# Patient Record
Sex: Female | Born: 1990 | Hispanic: No | Marital: Single | Smoking: Former smoker
Health system: Southern US, Community
[De-identification: ages and names within clinical notes are randomized; demographics above are authoritative.]

## PROBLEM LIST (undated history)

## (undated) ENCOUNTER — Inpatient Hospital Stay (HOSPITAL_COMMUNITY): Payer: Self-pay

## (undated) ENCOUNTER — Inpatient Hospital Stay: Admission: RE | Payer: Managed Care, Other (non HMO) | Source: Home / Self Care

## (undated) DIAGNOSIS — G43909 Migraine, unspecified, not intractable, without status migrainosus: Secondary | ICD-10-CM

## (undated) DIAGNOSIS — D649 Anemia, unspecified: Secondary | ICD-10-CM

## (undated) DIAGNOSIS — Z9889 Other specified postprocedural states: Secondary | ICD-10-CM

## (undated) DIAGNOSIS — M797 Fibromyalgia: Secondary | ICD-10-CM

## (undated) DIAGNOSIS — N83209 Unspecified ovarian cyst, unspecified side: Secondary | ICD-10-CM

## (undated) DIAGNOSIS — F419 Anxiety disorder, unspecified: Secondary | ICD-10-CM

## (undated) DIAGNOSIS — M459 Ankylosing spondylitis of unspecified sites in spine: Secondary | ICD-10-CM

## (undated) DIAGNOSIS — J45909 Unspecified asthma, uncomplicated: Secondary | ICD-10-CM

## (undated) DIAGNOSIS — Z8759 Personal history of other complications of pregnancy, childbirth and the puerperium: Secondary | ICD-10-CM

## (undated) DIAGNOSIS — Z87442 Personal history of urinary calculi: Secondary | ICD-10-CM

## (undated) DIAGNOSIS — R112 Nausea with vomiting, unspecified: Secondary | ICD-10-CM

## (undated) DIAGNOSIS — N200 Calculus of kidney: Secondary | ICD-10-CM

## (undated) HISTORY — PX: OTHER SURGICAL HISTORY: SHX169

## (undated) HISTORY — DX: Ankylosing spondylitis of unspecified sites in spine: M45.9

## (undated) HISTORY — DX: Unspecified asthma, uncomplicated: J45.909

## (undated) HISTORY — PX: WISDOM TOOTH EXTRACTION: SHX21

## (undated) HISTORY — DX: Calculus of kidney: N20.0

## (undated) HISTORY — DX: Fibromyalgia: M79.7

## (undated) HISTORY — PX: COLONOSCOPY: SHX174

## (undated) HISTORY — DX: Personal history of other complications of pregnancy, childbirth and the puerperium: Z87.59

## (undated) HISTORY — DX: Migraine, unspecified, not intractable, without status migrainosus: G43.909

## (undated) HISTORY — PX: ESOPHAGOGASTRODUODENOSCOPY ENDOSCOPY: SHX5814

---
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
## 2010-01-29 DIAGNOSIS — M459 Ankylosing spondylitis of unspecified sites in spine: Secondary | ICD-10-CM

## 2010-01-29 HISTORY — DX: Ankylosing spondylitis of unspecified sites in spine: M45.9

## 2012-01-30 DIAGNOSIS — K5792 Diverticulitis of intestine, part unspecified, without perforation or abscess without bleeding: Secondary | ICD-10-CM

## 2012-01-30 HISTORY — DX: Diverticulitis of intestine, part unspecified, without perforation or abscess without bleeding: K57.92

## 2012-03-26 LAB — LAB REPORT - SCANNED
EGFR: 112.3
TSH: 2.55 (ref 0.41–5.90)

## 2012-05-21 LAB — LAB REPORT - SCANNED: EGFR: 96.3

## 2013-12-18 ENCOUNTER — Encounter: Payer: Self-pay | Admitting: Neurology

## 2013-12-21 ENCOUNTER — Ambulatory Visit (INDEPENDENT_AMBULATORY_CARE_PROVIDER_SITE_OTHER): Payer: BC Managed Care – PPO | Admitting: Neurology

## 2013-12-21 ENCOUNTER — Encounter: Payer: Self-pay | Admitting: Neurology

## 2013-12-21 VITALS — BP 115/70 | HR 94 | Ht 60.5 in | Wt 173.0 lb

## 2013-12-21 DIAGNOSIS — G43809 Other migraine, not intractable, without status migrainosus: Secondary | ICD-10-CM

## 2013-12-21 DIAGNOSIS — M5442 Lumbago with sciatica, left side: Secondary | ICD-10-CM

## 2013-12-21 DIAGNOSIS — G43909 Migraine, unspecified, not intractable, without status migrainosus: Secondary | ICD-10-CM | POA: Insufficient documentation

## 2013-12-21 DIAGNOSIS — R4189 Other symptoms and signs involving cognitive functions and awareness: Secondary | ICD-10-CM

## 2013-12-21 DIAGNOSIS — M545 Low back pain, unspecified: Secondary | ICD-10-CM | POA: Insufficient documentation

## 2013-12-21 DIAGNOSIS — M5441 Lumbago with sciatica, right side: Secondary | ICD-10-CM

## 2013-12-21 MED ORDER — TOPIRAMATE ER 100 MG PO CAP24: 100.0000 mg | ORAL_CAPSULE | Freq: Every day | ORAL | Status: DC

## 2013-12-21 NOTE — Progress Notes (Addendum)
[facility]    Provider:  Dr Lucia GaskinsAhern Referring Provider: Purcell Kim, Angelica Y, MD Primary Care Physician:  No primary care provider on file.  CC:  Migraines  HPI:  Angelica HiresMaria Kim is a 23 Kim.o. female here as a referral from Dr. Su Hiltoberts for Migraines. She just relocated to the area. Migraines started 3 years ago and is on imitrex for acute management. She has been seeing [place] Insurance account managereurologist [place]. Headaches feel like someone is beating her in the head, starts behind the right eye and pulsating and then spreads to the whole head. The imitrex helps but still has the headache afterwards, she has to lay down in the dark for 3 hours after taking the imitrex. +Phonophobia more than photophobia, no nausea. Has them 5-6 a month and they can last about 12 hours without Imitrex, Imitrex helps and they last 3 hours after taking that.  She had a CT scan in the past and found white matter changes but lumbar puncture with workup was normal. No known triggers for migraines but worse with sinus congestion. Takes ibuprofen for back pain prn. Did have low B12 and Vitamin D but was recently tested and is normal, takes daily supplementation. She has had multiple Cat Scans and possibly MRIs of the brain. Is a Engineer, siteschool teacher. She feels her memory is worsening. She has LBP and radicular symptoms into both legs, worse with temperature changes like in the heat or in the tub. Has had MRIs of low back. Worse with standing or from sitting too long. Sleeping varies, sometimes has insomnia. Is forgetting things easier than she used, is repeating herself more. Asks the same questions over. Is a new teacher, a little stressful. Is getting worse. Denies depression, sadness most days. Has a rheumatologist because tested positive for autoimmune disorder but they can't figure out which one. Right facial twitching 20 minutes before onset.   Review of Systems: Patient complains of symptoms per HPI as well as the following  symptoms: No CP, No SOB. Pertinent negatives per HPI. All others negative.   History   Social History  . Marital Status: Single    Spouse Name: N/A    Number of Children: 0  . Years of Education: Masters   Occup[place]  .  [employer]   Social History Main Topics  . Smoking status: Never Smoker   . Smokeless tobacco: Never Used  . Alcohol Use: 0.0 oz/week    0 Not specified per week     Comment: occ  . Drug Use: No  . Sexual Activity: Not on file   Other Topics Concern  . Not on file   Social History Narrative   Patient drinks caffeine moderate    Patient is single   Patient lives alone    Patient has no children    Patient has a Master's degree     Family History  Problem Relation Age of Onset  . Hypertension Maternal Grandmother   . Arthritis Mother   . Kidney Stones Maternal Grandfather   . Thyroid disease Maternal Grandmother     Past Medical History  Diagnosis Date  . Migraine   . Fibromyalgia   . Asthma   . Kidney stones   . Ankylosing spondylitis     Past Surgical History  Procedure Laterality Date  . Wisdom tooth extraction    . Spinal tap      Current Outpatient Prescriptions  Medication Sig Dispense Refill  . albuterol (PROVENTIL HFA;VENTOLIN  HFA) 108 (90 BASE) MCG/ACT inhaler Inhale 2 puffs into the lungs every 6 (six) hours as needed for wheezing or shortness of breath.    . Cyanocobalamin (VITAMIN B12 PO) Take by mouth.    . fluticasone (FLOVENT DISKUS) 50 MCG/BLIST diskus inhaler Inhale 1 puff into the lungs 2 (two) times daily.    Marland Kitchen ibuprofen (ADVIL,MOTRIN) 800 MG tablet Take 800 mg by mouth every 8 (eight) hours as needed.    . Mometasone Furoate (NASONEX NA) Place into the nose 2 (two) times daily.    . SUMAtriptan (IMITREX) 100 MG tablet Take 100 mg by mouth every 2 (two) hours as needed for migraine or headache. May repeat in 2 hours if headache persists or recurs.     No current facility-administered medications for  this visit.    Allergies as of 12/21/2013 - Review Complete 12/21/2013  Allergen Reaction Noted  . Amoxicillin  12/18/2013    Vitals: BP 115/70 mmHg  Pulse 94  Ht 5' 0.5" (1.537 m)  Wt 173 lb (78.472 kg)  BMI 33.22 kg/m2 Last Weight:  Wt Readings from Last 1 Encounters:  12/21/13 173 lb (78.472 kg)   Last Height:   Ht Readings from Last 1 Encounters:  12/21/13 5' 0.5" (1.537 m)   Physical exam: Exam: Gen: NAD, conversant, well nourised, obese, well groomed                     CV: RRR, no MRG. No Carotid Bruits. No peripheral edema, warm, nontender Eyes: Conjunctivae clear without exudates or hemorrhage  Neuro: Detailed Neurologic Exam  Speech:    Speech is normal; fluent and spontaneous with normal comprehension.  Cognition:    The patient is oriented to person, place, and time;     recent and remote memory intact;     language fluent;     normal attention, concentration,     fund of knowledge Cranial Nerves:    The pupils are equal, round, and reactive to light. The fundi are normal and spontaneous venous pulsations are present. Visual fields are full to finger confrontation. Extraocular movements are intact. Trigeminal sensation is intact and the muscles of mastication are normal. The face is symmetric. The palate elevates in the midline. Voice is normal. Shoulder shrug is normal. The tongue has normal motion without fasciculations.   Coordination:    Normal finger to nose and heel to shin. Normal rapid alternating movements.   Gait:    Heel-toe and tandem gait are normal.   Motor Observation:    No asymmetry, no atrophy, and no involuntary movements noted. Tone:    Normal muscle tone.    Posture:    Posture is normal. normal erect    Strength:    Strength is V/V in the upper and lower limbs.      Sensation: intact     Reflex Exam:  DTR's:    Deep tendon reflexes in the upper and lower extremities are normal bilaterally.   Toes:    The toes are  downgoing bilaterally.   Clonus:    Clonus is absent.       Assessment/Plan:  23 year old female here to establish care for migraine disorder. Has migraines 5-6x a month. Is on Imitrex with poor relief. Discussed daily vs acute management and side effects of multiple ppx medications. Patient would like to try Topiramate, discussed side effects and teratogenicity and the need for birth control and a backup method. Will start Trokendi. Noted  that patient has a history of kidney stones and discussed that this is a side effect of Trokendi, discussed risks at length and patient wanted to go forward with this medication despite the warning. Continue Imitrex. Sounds like she has had thorough workup from neurology and rheumatology and need to request all records. For her LBP and radicular symptoms suggested PT and will review her MRIs. For her memory complaints in the setting of stress, will review MRIs of the brain and lab reports from previous neurologist and rheumatologist(per patient B12 was abnornal at one point but is now fine). Discussed: fluid hydration, eat healthy meals and do not skip any meals. Try to eat protein with a every meal and eat a healthy snack such as fruit or nuts in between meals. Try to keep a regular sleep-wake schedule and try to exercise daily, particularly in the form of walking, 20-30 minutes a day, if you can. To prevent or relieve headaches, try the following: Cool Compress. Lie down and place a cool compress on your head.  Avoid headache triggers. If certain foods or odors seem to have triggered your migraines in the past, avoid them. A headache diary might help you identify triggers.  Include physical activity in your daily routine. Try a daily walk or other moderate aerobic exercise.  Manage stress. Find healthy ways to cope with the stressors, such as delegating tasks on your to-do list.  Practice relaxation techniques. Try deep breathing, yoga, massage and visualization.    Eat regularly. Eating regularly scheduled meals and maintaining a healthy diet might help prevent headaches. Also, drink plenty of fluids.  Follow a regular sleep schedule. Sleep deprivation might contribute to headaches Consider biofeedback. With this mind-body technique, you learn to control certain bodily functions - such as muscle tension, heart rate and blood pressure - to prevent headaches or reduce headache pain.    Proceed to emergency room if you experience new or worsening symptoms or symptoms do not resolve, if you have new neurologic symptoms or if headache is severe, or for any concerning symptom.    Angelica DeanAntonia Ahern, MD  [facility] [address] [place], [place] [id number]  Phone [phone] Fax [phone]

## 2013-12-21 NOTE — Patient Instructions (Signed)
Overall you are doing fairly well but I do want to suggest a few things today:   Remember to drink plenty of fluid, eat healthy meals and do not skip any meals. Try to eat protein with a every meal and eat a healthy snack such as fruit or nuts in between meals. Try to keep a regular sleep-wake schedule and try to exercise daily, particularly in the form of walking, 20-30 minutes a day, if you can.   As far as your medications are concerned, I would like to suggest; Trokendi as directed.   As far as diagnostic testing: Will review all previous records  I would like to see you back in 3 months, sooner if we need to. Please call us with any interim questions, concerns, problems, updates or refill requests.   Please also call us for any test results so we can go over those with you on the phone.  My clinical assistant and will answer any of your questions and relay your messages to me and also relay most of my messages to you.   Our phone number is [phone]. We also have an after hours call service for urgent matters and there is a physician on-call for urgent questions. For any emergencies you know to call 911 or go to the nearest emergency room

## 2013-12-29 ENCOUNTER — Telehealth: Payer: Self-pay | Admitting: *Deleted

## 2013-12-29 NOTE — Telephone Encounter (Signed)
Release fax to Dr. Carlynn SpryKivitz on 12/28/13 and on 12/29/13.

## 2013-12-30 ENCOUNTER — Emergency Department (HOSPITAL_COMMUNITY)
Admission: EM | Admit: 2013-12-30 | Discharge: 2013-12-30 | Disposition: A | Discharge status: Home / Self Care | Payer: BC Managed Care – PPO | Attending: Emergency Medicine | Admitting: Emergency Medicine

## 2013-12-30 ENCOUNTER — Telehealth: Payer: Self-pay | Admitting: Neurology

## 2013-12-30 ENCOUNTER — Encounter (HOSPITAL_COMMUNITY): Payer: Self-pay | Admitting: *Deleted

## 2013-12-30 DIAGNOSIS — I10 Essential (primary) hypertension: Secondary | ICD-10-CM | POA: Diagnosis present

## 2013-12-30 DIAGNOSIS — Z87442 Personal history of urinary calculi: Secondary | ICD-10-CM | POA: Diagnosis not present

## 2013-12-30 DIAGNOSIS — Z880 Allergy status to penicillin: Secondary | ICD-10-CM | POA: Diagnosis not present

## 2013-12-30 DIAGNOSIS — K8500 Idiopathic acute pancreatitis without necrosis or infection: Secondary | ICD-10-CM

## 2013-12-30 DIAGNOSIS — J45909 Unspecified asthma, uncomplicated: Secondary | ICD-10-CM | POA: Insufficient documentation

## 2013-12-30 DIAGNOSIS — Z7952 Long term (current) use of systemic steroids: Secondary | ICD-10-CM | POA: Insufficient documentation

## 2013-12-30 DIAGNOSIS — G43909 Migraine, unspecified, not intractable, without status migrainosus: Secondary | ICD-10-CM | POA: Insufficient documentation

## 2013-12-30 DIAGNOSIS — Z8739 Personal history of other diseases of the musculoskeletal system and connective tissue: Secondary | ICD-10-CM | POA: Diagnosis not present

## 2013-12-30 DIAGNOSIS — Z3202 Encounter for pregnancy test, result negative: Secondary | ICD-10-CM | POA: Diagnosis not present

## 2013-12-30 DIAGNOSIS — Z79899 Other long term (current) drug therapy: Secondary | ICD-10-CM | POA: Diagnosis not present

## 2013-12-30 LAB — CBC WITH DIFFERENTIAL/PLATELET
BASOS PCT: 0 % (ref 0–1)
Basophils Absolute: 0 10*3/uL (ref 0.0–0.1)
EOS ABS: 0.2 10*3/uL (ref 0.0–0.7)
Eosinophils Relative: 1 % (ref 0–5)
HCT: 40.2 % (ref 36.0–46.0)
Hemoglobin: 14 g/dL (ref 12.0–15.0)
Lymphocytes Relative: 29 % (ref 12–46)
Lymphs Abs: 3.3 10*3/uL (ref 0.7–4.0)
MCH: 29.9 pg (ref 26.0–34.0)
MCHC: 34.8 g/dL (ref 30.0–36.0)
MCV: 85.9 fL (ref 78.0–100.0)
MONO ABS: 1 10*3/uL (ref 0.1–1.0)
Monocytes Relative: 8 % (ref 3–12)
NEUTROS PCT: 62 % (ref 43–77)
Neutro Abs: 7 10*3/uL (ref 1.7–7.7)
Platelets: 271 10*3/uL (ref 150–400)
RBC: 4.68 MIL/uL (ref 3.87–5.11)
RDW: 12.5 % (ref 11.5–15.5)
WBC: 11.5 10*3/uL — ABNORMAL HIGH (ref 4.0–10.5)

## 2013-12-30 LAB — COMPREHENSIVE METABOLIC PANEL
ALBUMIN: 3.9 g/dL (ref 3.5–5.2)
ALT: 13 U/L (ref 0–35)
AST: 16 U/L (ref 0–37)
Alkaline Phosphatase: 118 U/L — ABNORMAL HIGH (ref 39–117)
Anion gap: 13 (ref 5–15)
BUN: 11 mg/dL (ref 6–23)
CALCIUM: 9.3 mg/dL (ref 8.4–10.5)
CO2: 24 mEq/L (ref 19–32)
Chloride: 99 mEq/L (ref 96–112)
Creatinine, Ser: 0.8 mg/dL (ref 0.50–1.10)
GFR calc Af Amer: >90 mL/min (ref >90)
GFR calc non Af Amer: >90 mL/min (ref >90)
Glucose, Bld: 97 mg/dL (ref 70–99)
POTASSIUM: 3.9 meq/L (ref 3.7–5.3)
Sodium: 136 mEq/L — ABNORMAL LOW (ref 137–147)
TOTAL PROTEIN: 7.3 g/dL (ref 6.0–8.3)
Total Bilirubin: 0.7 mg/dL (ref 0.3–1.2)

## 2013-12-30 LAB — URINALYSIS, ROUTINE W REFLEX MICROSCOPIC
Bilirubin Urine: NEGATIVE
Glucose, UA: NEGATIVE mg/dL
Hgb urine dipstick: NEGATIVE
Ketones, ur: NEGATIVE mg/dL
Leukocytes, UA: NEGATIVE
Nitrite: NEGATIVE
Protein, ur: NEGATIVE mg/dL
SPECIFIC GRAVITY, URINE: 1.025 (ref 1.005–1.030)
Urobilinogen, UA: 2 mg/dL — ABNORMAL HIGH (ref 0.0–1.0)
pH: 6 (ref 5.0–8.0)

## 2013-12-30 LAB — PREGNANCY, URINE: Preg Test, Ur: NEGATIVE

## 2013-12-30 LAB — LIPASE, BLOOD: LIPASE: 64 U/L — AB (ref 11–59)

## 2013-12-30 MED ORDER — OMEPRAZOLE 20 MG PO CPDR: 20.0000 mg | DELAYED_RELEASE_CAPSULE | Freq: Every day | ORAL | Status: DC

## 2013-12-30 MED ORDER — MORPHINE SULFATE 4 MG/ML IJ SOLN
4.0000 mg | Freq: Once | INTRAMUSCULAR | Status: AC
  Administered 2013-12-30: 4 mg via INTRAVENOUS
  Filled 2013-12-30: qty 1

## 2013-12-30 MED ORDER — LORAZEPAM 2 MG/ML IJ SOLN
1.0000 mg | Freq: Once | INTRAMUSCULAR | Status: AC
  Administered 2013-12-30: 1 mg via INTRAVENOUS
  Filled 2013-12-30: qty 1

## 2013-12-30 MED ORDER — GI COCKTAIL ~~LOC~~
30.0000 mL | Freq: Once | ORAL | Status: AC
  Administered 2013-12-30: 30 mL via ORAL
  Filled 2013-12-30: qty 30

## 2013-12-30 MED ORDER — SODIUM CHLORIDE 0.9 % IV BOLUS (SEPSIS)
1000.0000 mL | Freq: Once | INTRAVENOUS | Status: AC
  Administered 2013-12-30: 1000 mL via INTRAVENOUS

## 2013-12-30 MED ORDER — ONDANSETRON 4 MG PO TBDP: 4.0000 mg | ORAL_TABLET | Freq: Three times a day (TID) | ORAL | Status: DC | PRN

## 2013-12-30 MED ORDER — ONDANSETRON HCL 4 MG/2ML IJ SOLN
4.0000 mg | Freq: Once | INTRAMUSCULAR | Status: AC
  Administered 2013-12-30: 4 mg via INTRAVENOUS
  Filled 2013-12-30: qty 2

## 2013-12-30 NOTE — Telephone Encounter (Signed)
Pt is calling stating that the medication Topiramate ER (TROKENDI XR) 100 MG CP24 is causing her to have pelvic pain and pain up under her ribs that goes straight to her back.  Please call advise.

## 2013-12-30 NOTE — Telephone Encounter (Signed)
Dr Lucia GaskinsAhern, sorry this one is yours.  I'd for Megan by mistake.

## 2013-12-30 NOTE — Telephone Encounter (Signed)
I have not seen this patient. Please forward to the MD that has.

## 2013-12-30 NOTE — Discharge Instructions (Signed)
As discussed, with pancreatitis is important to maintain a clear liquid diet for the next 2 days, then, and only then, slowly began to eat normal foods.  Please stay well hydrated.  Return here for any concerning changes in your condition.

## 2013-12-30 NOTE — Telephone Encounter (Signed)
Advised patient to stop the Trokendi and she can proceed to the ED if pain is severe. Topiramate has a side effect of Kidney stones and we discussed this at length in the office given her PMHx. Discussed other options for headache ppx as well but patient wanted to try Trokendi despite the risks.  She should proceed to the ED if pain is severe. Would prefer her not to stop Topiramate abruptly but depends on the severity and quality of her pain. Left a message for her to call me back.

## 2013-12-30 NOTE — ED Notes (Signed)
Pt states she has pain under lt ribs, pain radiates across abdomen, tender to the touch.

## 2013-12-30 NOTE — ED Notes (Signed)
States her pain is starting to come back bad again.

## 2013-12-30 NOTE — ED Provider Notes (Signed)
CSN: [id number]     Arrival date & time 12/30/13  1639 History  This chart was scribed for Angelica Munch, MD by Gwenyth Ober, ED Scribe. This patient was seen in room [facility] and the patient's care was started at 4:56 PM.    Chief Complaint  Patient presents with  . Abdominal Pain   The history is provided by the patient. No language interpreter was used.    HPI Comments: Angelica Kim is a 23 y.o. female  with a history of fibromyalgia, asthma, arthritis and an unspecified autoimmune disease who presents to the Emergency Department complaining of constant abdominal pain that is inferior to her ribs and started 3 days ago. Pt states she felt a popping pain under right rib 3 days ago and that pain began to radiate to lower and left abdomen 2 days ago. She notes throbbing and burning pain after eating and increased abdominal pain with breathing as associated symptoms. Pt reports that she has tested positive for autoimmune disease, but has not received specific diagnosis. Pt also sees Dr. Lucia Gaskins for migraines. She denies history of abdominal surgeries, but notes that she has of kidney stones and an ovarian cyst. She notes that currently symptoms started out similar to prior pain, but have become different. Pt currently works as a Dispensing optician. She denies fever, difficulty breathing, weight loss/weight gain, rash, changes in voice, vomiting, diarrhea, loss of appetite and urinary changes as associated symptoms.   Past Medical History  Diagnosis Date  . Migraine   . Fibromyalgia   . Asthma   . Kidney stones   . Ankylosing spondylitis    Past Surgical History  Procedure Laterality Date  . Wisdom tooth extraction    . Spinal tap     Family History  Problem Relation Age of Onset  . Hypertension Maternal Grandmother   . Arthritis Mother   . Kidney Stones Maternal Grandfather   . Thyroid disease Maternal Grandmother    History  Substance Use Topics  . Smoking status: Never Smoker    . Smokeless tobacco: Never Used  . Alcohol Use: 0.0 oz/week    0 Not specified per week     Comment: occ   OB History    No data available     Review of Systems  Constitutional: Negative for fever, appetite change and unexpected weight change.       Per HPI, otherwise negative  HENT: Negative for voice change.        Per HPI, otherwise negative  Respiratory: Negative for shortness of breath.        Per HPI, otherwise negative  Cardiovascular:       Per HPI, otherwise negative  Gastrointestinal: Positive for abdominal pain. Negative for nausea, vomiting and diarrhea.  Endocrine:       Negative aside from HPI  Genitourinary: Negative for decreased urine volume and difficulty urinating.       Neg aside from HPI   Musculoskeletal:       Per HPI, otherwise negative  Skin: Negative.  Negative for rash.  Neurological: Negative for syncope.      Allergies  Amoxicillin  Home Medications   Prior to Admission medications   Medication Sig Start Date End Date Taking? Authorizing Provider  Cyanocobalamin (VITAMIN B12 PO) Take 1 tablet by mouth daily.    Yes Historical Provider, MD  fluticasone (FLOVENT DISKUS) 50 MCG/BLIST diskus inhaler Inhale 1 puff into the lungs 2 (two) times daily.   Yes Historical Provider,  MD  Mometasone Furoate (NASONEX NA) Place into the nose 2 (two) times daily.   Yes Historical Provider, MD  Topiramate ER (TROKENDI XR) 100 MG CP24 Take 100 mg by mouth daily. 12/21/13  Yes Anson FretAntonia B Ahern, MD  albuterol (PROVENTIL HFA;VENTOLIN HFA) 108 (90 BASE) MCG/ACT inhaler Inhale 2 puffs into the lungs every 6 (six) hours as needed for wheezing or shortness of breath.    Historical Provider, MD  omeprazole (PRILOSEC) 20 MG capsule Take 1 capsule (20 mg total) by mouth daily. 12/30/13   Angelica Munchobert Jordyn Doane, MD  ondansetron (ZOFRAN ODT) 4 MG disintegrating tablet Take 1 tablet (4 mg total) by mouth every 8 (eight) hours as needed for nausea or vomiting. 12/30/13   Angelica Munchobert  Reeves Musick, MD  SUMAtriptan (IMITREX) 100 MG tablet Take 100 mg by mouth every 2 (two) hours as needed for migraine or headache. May repeat in 2 hours if headache persists or recurs.    Historical Provider, MD   BP 93/57 mmHg  Pulse 78  Temp(Src) 98.7 F (37.1 C) (Oral)  Resp 20  Ht 5' (1.524 m)  Wt 168 lb (76.204 kg)  BMI 32.81 kg/m2  SpO2 97% Physical Exam  Constitutional: She is oriented to person, place, and time. She appears well-developed and well-nourished. No distress.  HENT:  Head: Normocephalic and atraumatic.  Eyes: Conjunctivae and EOM are normal.  Cardiovascular: Normal rate and regular rhythm.   Pulmonary/Chest: Effort normal and breath sounds normal. No stridor. No respiratory distress.  Abdominal: She exhibits no distension. There is tenderness. There is no rebound and no guarding.  Diffusely tender, but not peritoneal. No guarding or rebound. Inconsistent, general pain.  Musculoskeletal: She exhibits no edema.  Neurological: She is alert and oriented to person, place, and time. No cranial nerve deficit.  Skin: Skin is warm and dry.  Psychiatric: She has a normal mood and affect.  Nursing note and vitals reviewed.   ED Course  Procedures (including critical care time)  COORDINATION OF CARE: 5:03 PM Discussed treatment plan which includes lab work. Pt agreed to plan.  8:37 PM Pt notes continued nausea and dizziness. She does not want to be admitted to the hospital. Advised pt to follow clear liquid diet for next 24 hours and to return with worsening symptoms.   Labs Review Labs Reviewed  CBC WITH DIFFERENTIAL - Abnormal; Notable for the following:    WBC 11.5 (*)    All other components within normal limits  COMPREHENSIVE METABOLIC PANEL - Abnormal; Notable for the following:    Sodium 136 (*)    Alkaline Phosphatase 118 (*)    All other components within normal limits  LIPASE, BLOOD - Abnormal; Notable for the following:    Lipase 64 (*)    All other  components within normal limits  URINALYSIS, ROUTINE W REFLEX MICROSCOPIC - Abnormal; Notable for the following:    Urobilinogen, UA 2.0 (*)    All other components within normal limits  PREGNANCY, URINE    On several repeat evaluation the patient was awake and alert, hemodynamically stable.  She did continue to have mild nausea, though the pain improved. We had lengthy conversations about pancreatitis, the need to follow-up with gastroenterology. After the patient had persistent nausea, in spite of 2 L fluid hydration, I recommended inpatient admission for further evaluation, management.  The patient requested discharge with outpatient follow-up. Given the patient's hemodynamic status, youth, ability to follow-up, this was reasonable.   MDM   Final diagnoses:  Idiopathic  acute pancreatitis   I personally performed the services described in this documentation, which was scribed in my presence. The recorded information has been reviewed and is accurate.   Patient presents with new abdominal pain, nausea. Patient is in generally well, today's evaluation demonstrates pancreatitis is likely etiology. No evidence for peritonitis, nor hepatobiliary dysfunction. No evidence for sepsis, bacteremia. Patient did improve with fluid hydration, though she remained mildly nauseous. Patient requested discharge, for outpatient follow-up.     Angelica Munchobert Cahterine Heinzel, MD 12/30/13 2131

## 2013-12-30 NOTE — Telephone Encounter (Signed)
Megan, here's one. 

## 2013-12-31 ENCOUNTER — Encounter (INDEPENDENT_AMBULATORY_CARE_PROVIDER_SITE_OTHER): Payer: Self-pay | Admitting: *Deleted

## 2013-12-31 ENCOUNTER — Encounter (INDEPENDENT_AMBULATORY_CARE_PROVIDER_SITE_OTHER): Payer: Self-pay | Admitting: Internal Medicine

## 2013-12-31 ENCOUNTER — Ambulatory Visit (INDEPENDENT_AMBULATORY_CARE_PROVIDER_SITE_OTHER): Payer: BC Managed Care – PPO | Admitting: Internal Medicine

## 2013-12-31 VITALS — BP 112/60 | HR 80 | Temp 98.0°F | Ht 60.0 in | Wt 172.2 lb

## 2013-12-31 DIAGNOSIS — R1012 Left upper quadrant pain: Secondary | ICD-10-CM

## 2013-12-31 LAB — CBC WITH DIFFERENTIAL/PLATELET
BASOS ABS: 0 10*3/uL (ref 0.0–0.1)
BASOS PCT: 0 % (ref 0–1)
EOS PCT: 1 % (ref 0–5)
Eosinophils Absolute: 0.1 10*3/uL (ref 0.0–0.7)
HCT: 38.4 % (ref 36.0–46.0)
Hemoglobin: 13.1 g/dL (ref 12.0–15.0)
LYMPHS PCT: 31 % (ref 12–46)
Lymphs Abs: 3.5 10*3/uL (ref 0.7–4.0)
MCH: 29.4 pg (ref 26.0–34.0)
MCHC: 34.1 g/dL (ref 30.0–36.0)
MCV: 86.1 fL (ref 78.0–100.0)
MPV: 10.3 fL (ref 9.4–12.4)
Monocytes Absolute: 0.9 10*3/uL (ref 0.1–1.0)
Monocytes Relative: 8 % (ref 3–12)
Neutro Abs: 6.8 10*3/uL (ref 1.7–7.7)
Neutrophils Relative %: 60 % (ref 43–77)
Platelets: 264 10*3/uL (ref 150–400)
RBC: 4.46 MIL/uL (ref 3.87–5.11)
RDW: 12.7 % (ref 11.5–15.5)
WBC: 11.4 10*3/uL — AB (ref 4.0–10.5)

## 2013-12-31 NOTE — Progress Notes (Addendum)
Subjective:    Patient ID: Angelica Kim, female    DOB: 02-08-1990, 23 y.o.   MRN: [id number]  HPI Referred to our office from the ED at [facility] for pancreatitis. She had epigastric pain and left upper quadrant pain. The pain started Monday. She says she took a deep breath and felt something pop in her epigastric region. This is when the pain started.  She says the pain was in the left abdomen and radiated into her back. She was evaluated in the ED yesterday and given diagnosis of pancreatitis. She tells me she has a burning sensation in her epigastric region and just below her umbilicus.  There has been no fever. She says when she eats it hurts. Rates pain 10/10. She was told to drink gator aid and chicken broth. She is tolerating the liquid diet. She denies having these symptoms before. When she drinks she drinks maybe a glass of wine or one-two wine cooler.  Pain worse standing or lying down or quick, jerky movements.   Lipase     Component Value Date/Time   LIPASE 64* 12/30/2013 1715    CMP     Component Value Date/Time   NA 136* 12/30/2013 1715   K 3.9 12/30/2013 1715   CL 99 12/30/2013 1715   CO2 24 12/30/2013 1715   GLUCOSE 97 12/30/2013 1715   BUN 11 12/30/2013 1715   CREATININE 0.80 12/30/2013 1715   CALCIUM 9.3 12/30/2013 1715   PROT 7.3 12/30/2013 1715   ALBUMIN 3.9 12/30/2013 1715   AST 16 12/30/2013 1715   ALT 13 12/30/2013 1715   ALKPHOS 118* 12/30/2013 1715   BILITOT 0.7 12/30/2013 1715   GFRNONAA >90 12/30/2013 1715   GFRAA >90 12/30/2013 1715    CBC    Component Value Date/Time   WBC 11.5* 12/30/2013 1715   RBC 4.68 12/30/2013 1715   HGB 14.0 12/30/2013 1715   HCT 40.2 12/30/2013 1715   PLT 271 12/30/2013 1715   MCV 85.9 12/30/2013 1715   MCH 29.9 12/30/2013 1715   MCHC 34.8 12/30/2013 1715   RDW 12.5 12/30/2013 1715   LYMPHSABS 3.3 12/30/2013 1715   MONOABS 1.0 12/30/2013 1715   EOSABS 0.2 12/30/2013 1715   BASOSABS 0.0 12/30/2013 1715         Review of Systems Past Medical History  Diagnosis Date  . Migraine   . Fibromyalgia   . Asthma   . Kidney stones   . Ankylosing spondylitis     Past Surgical History  Procedure Laterality Date  . Wisdom tooth extraction    . Spinal tap      Allergies  Allergen Reactions  . Amoxicillin     Current Outpatient Prescriptions on File Prior to Visit  Medication Sig Dispense Refill  . albuterol (PROVENTIL HFA;VENTOLIN HFA) 108 (90 BASE) MCG/ACT inhaler Inhale 2 puffs into the lungs every 6 (six) hours as needed for wheezing or shortness of breath.    . Cyanocobalamin (VITAMIN B12 PO) Take 1 tablet by mouth daily.     . fluticasone (FLOVENT DISKUS) 50 MCG/BLIST diskus inhaler Inhale 1 puff into the lungs 2 (two) times daily.    . Mometasone Furoate (NASONEX NA) Place into the nose 2 (two) times daily.    . SUMAtriptan (IMITREX) 100 MG tablet Take 100 mg by mouth every 2 (two) hours as needed for migraine or headache. May repeat in 2 hours if headache persists or recurs.    . Topiramate ER (TROKENDI XR) 100 MG  CP24 Take 100 mg by mouth daily. 21 capsule 0  . omeprazole (PRILOSEC) 20 MG capsule Take 1 capsule (20 mg total) by mouth daily. (Patient not taking: Reported on 12/31/2013) 20 capsule 0  . ondansetron (ZOFRAN ODT) 4 MG disintegrating tablet Take 1 tablet (4 mg total) by mouth every 8 (eight) hours as needed for nausea or vomiting. (Patient not taking: Reported on 12/31/2013) 20 tablet 0   No current facility-administered medications on file prior to visit.        Objective:   Physical Exam Filed Vitals:   12/31/13 1447  Height: 5' (1.524 m)  Weight: 172 lb 3.2 oz (78.109 kg)    Alert and oriented. Skin warm and dry. Oral mucosa is moist.   . Sclera anicteric, conjunctivae is pink. Thyroid not enlarged. No cervical lymphadenopathy. Lungs clear. Heart regular rate and rhythm.  Abdomen is soft. Bowel sounds are positive. No hepatomegaly. No abdominal masses felt.  Tenderness epigastric region.   No edema to lower extremities.        Assessment & Plan:  Elevated lipase slight. CBC, lipase, cmet, US abdomen. Further recommendations to follow. Advised to go to ED if symptoms worsen.

## 2013-12-31 NOTE — Patient Instructions (Signed)
CBC, CMET, Lipase. US abdomen

## 2014-01-01 ENCOUNTER — Ambulatory Visit (HOSPITAL_COMMUNITY)
Admission: RE | Admit: 2014-01-01 | Discharge: 2014-01-01 | Disposition: A | Discharge status: Home / Self Care | Payer: BC Managed Care – PPO | Source: Ambulatory Visit | Attending: Internal Medicine | Admitting: Internal Medicine

## 2014-01-01 DIAGNOSIS — R1012 Left upper quadrant pain: Secondary | ICD-10-CM | POA: Insufficient documentation

## 2014-01-01 LAB — COMPREHENSIVE METABOLIC PANEL
ALBUMIN: 3.8 g/dL (ref 3.5–5.2)
ALK PHOS: 84 U/L (ref 39–117)
ALT: 11 U/L (ref 0–35)
AST: 15 U/L (ref 0–37)
BILIRUBIN TOTAL: 0.8 mg/dL (ref 0.2–1.2)
BUN: 9 mg/dL (ref 6–23)
CO2: 23 mEq/L (ref 19–32)
Calcium: 8.8 mg/dL (ref 8.4–10.5)
Chloride: 104 mEq/L (ref 96–112)
Creat: 0.75 mg/dL (ref 0.50–1.10)
Glucose, Bld: 81 mg/dL (ref 70–99)
POTASSIUM: 4 meq/L (ref 3.5–5.3)
Sodium: 137 mEq/L (ref 135–145)
Total Protein: 6.5 g/dL (ref 6.0–8.3)

## 2014-01-01 LAB — LIPASE: Lipase: 51 U/L (ref 0–75)

## 2014-01-03 ENCOUNTER — Emergency Department (HOSPITAL_COMMUNITY): Payer: BC Managed Care – PPO

## 2014-01-03 ENCOUNTER — Emergency Department (HOSPITAL_COMMUNITY)
Admission: EM | Admit: 2014-01-03 | Discharge: 2014-01-03 | Disposition: A | Discharge status: Home / Self Care | Payer: BC Managed Care – PPO | Attending: Emergency Medicine | Admitting: Emergency Medicine

## 2014-01-03 ENCOUNTER — Encounter (HOSPITAL_COMMUNITY): Payer: Self-pay | Admitting: *Deleted

## 2014-01-03 DIAGNOSIS — Z880 Allergy status to penicillin: Secondary | ICD-10-CM | POA: Diagnosis not present

## 2014-01-03 DIAGNOSIS — Z3202 Encounter for pregnancy test, result negative: Secondary | ICD-10-CM | POA: Diagnosis not present

## 2014-01-03 DIAGNOSIS — Z87442 Personal history of urinary calculi: Secondary | ICD-10-CM | POA: Diagnosis not present

## 2014-01-03 DIAGNOSIS — J45909 Unspecified asthma, uncomplicated: Secondary | ICD-10-CM | POA: Insufficient documentation

## 2014-01-03 DIAGNOSIS — R1033 Periumbilical pain: Secondary | ICD-10-CM | POA: Diagnosis present

## 2014-01-03 DIAGNOSIS — R1110 Vomiting, unspecified: Secondary | ICD-10-CM | POA: Insufficient documentation

## 2014-01-03 DIAGNOSIS — Z79899 Other long term (current) drug therapy: Secondary | ICD-10-CM | POA: Diagnosis not present

## 2014-01-03 DIAGNOSIS — Z8739 Personal history of other diseases of the musculoskeletal system and connective tissue: Secondary | ICD-10-CM | POA: Insufficient documentation

## 2014-01-03 DIAGNOSIS — Z7951 Long term (current) use of inhaled steroids: Secondary | ICD-10-CM | POA: Diagnosis not present

## 2014-01-03 DIAGNOSIS — R1084 Generalized abdominal pain: Secondary | ICD-10-CM | POA: Insufficient documentation

## 2014-01-03 DIAGNOSIS — N939 Abnormal uterine and vaginal bleeding, unspecified: Secondary | ICD-10-CM | POA: Insufficient documentation

## 2014-01-03 DIAGNOSIS — N760 Acute vaginitis: Secondary | ICD-10-CM | POA: Diagnosis not present

## 2014-01-03 DIAGNOSIS — G43909 Migraine, unspecified, not intractable, without status migrainosus: Secondary | ICD-10-CM | POA: Insufficient documentation

## 2014-01-03 DIAGNOSIS — R109 Unspecified abdominal pain: Secondary | ICD-10-CM

## 2014-01-03 DIAGNOSIS — B9689 Other specified bacterial agents as the cause of diseases classified elsewhere: Secondary | ICD-10-CM

## 2014-01-03 LAB — POC URINE PREG, ED: Preg Test, Ur: NEGATIVE

## 2014-01-03 LAB — URINALYSIS, ROUTINE W REFLEX MICROSCOPIC
Bilirubin Urine: NEGATIVE
Glucose, UA: NEGATIVE mg/dL
Ketones, ur: NEGATIVE mg/dL
LEUKOCYTES UA: NEGATIVE
Nitrite: NEGATIVE
PROTEIN: NEGATIVE mg/dL
Specific Gravity, Urine: 1.024 (ref 1.005–1.030)
Urobilinogen, UA: 1 mg/dL (ref 0.0–1.0)
pH: 6.5 (ref 5.0–8.0)

## 2014-01-03 LAB — CBC WITH DIFFERENTIAL/PLATELET
BASOS PCT: 0 % (ref 0–1)
Basophils Absolute: 0 10*3/uL (ref 0.0–0.1)
EOS ABS: 0.1 10*3/uL (ref 0.0–0.7)
EOS PCT: 1 % (ref 0–5)
HEMATOCRIT: 42.9 % (ref 36.0–46.0)
HEMOGLOBIN: 14.7 g/dL (ref 12.0–15.0)
LYMPHS ABS: 3 10*3/uL (ref 0.7–4.0)
Lymphocytes Relative: 28 % (ref 12–46)
MCH: 29.5 pg (ref 26.0–34.0)
MCHC: 34.3 g/dL (ref 30.0–36.0)
MCV: 86.1 fL (ref 78.0–100.0)
MONO ABS: 0.8 10*3/uL (ref 0.1–1.0)
Monocytes Relative: 8 % (ref 3–12)
NEUTROS PCT: 63 % (ref 43–77)
Neutro Abs: 6.6 10*3/uL (ref 1.7–7.7)
Platelets: 306 10*3/uL (ref 150–400)
RBC: 4.98 MIL/uL (ref 3.87–5.11)
RDW: 12.4 % (ref 11.5–15.5)
WBC: 10.5 10*3/uL (ref 4.0–10.5)

## 2014-01-03 LAB — COMPREHENSIVE METABOLIC PANEL
ALBUMIN: 3.8 g/dL (ref 3.5–5.2)
ALT: 11 U/L (ref 0–35)
ANION GAP: 12 (ref 5–15)
AST: 15 U/L (ref 0–37)
Alkaline Phosphatase: 106 U/L (ref 39–117)
BUN: 10 mg/dL (ref 6–23)
CALCIUM: 9.4 mg/dL (ref 8.4–10.5)
CO2: 25 mEq/L (ref 19–32)
CREATININE: 0.8 mg/dL (ref 0.50–1.10)
Chloride: 101 mEq/L (ref 96–112)
GFR calc Af Amer: >90 mL/min (ref >90)
GFR calc non Af Amer: >90 mL/min (ref >90)
GLUCOSE: 81 mg/dL (ref 70–99)
Potassium: 4 mEq/L (ref 3.7–5.3)
Sodium: 138 mEq/L (ref 137–147)
TOTAL PROTEIN: 7.3 g/dL (ref 6.0–8.3)
Total Bilirubin: 0.6 mg/dL (ref 0.3–1.2)

## 2014-01-03 LAB — URINE MICROSCOPIC-ADD ON

## 2014-01-03 LAB — WET PREP, GENITAL
Trich, Wet Prep: NONE SEEN
Yeast Wet Prep HPF POC: NONE SEEN

## 2014-01-03 LAB — LIPASE, BLOOD: LIPASE: 54 U/L (ref 11–59)

## 2014-01-03 MED ORDER — SODIUM CHLORIDE 0.9 % IV BOLUS (SEPSIS)
1000.0000 mL | Freq: Once | INTRAVENOUS | Status: AC
  Administered 2014-01-03: 1000 mL via INTRAVENOUS

## 2014-01-03 MED ORDER — METRONIDAZOLE 500 MG PO TABS
500.0000 mg | ORAL_TABLET | Freq: Once | ORAL | Status: AC
  Administered 2014-01-03: 500 mg via ORAL
  Filled 2014-01-03: qty 1

## 2014-01-03 MED ORDER — ONDANSETRON HCL 4 MG/2ML IJ SOLN
4.0000 mg | Freq: Once | INTRAMUSCULAR | Status: DC
  Filled 2014-01-03: qty 2

## 2014-01-03 MED ORDER — MORPHINE SULFATE 4 MG/ML IJ SOLN
4.0000 mg | Freq: Once | INTRAMUSCULAR | Status: AC
  Administered 2014-01-03: 4 mg via INTRAVENOUS
  Filled 2014-01-03: qty 1

## 2014-01-03 MED ORDER — ONDANSETRON HCL 4 MG/2ML IJ SOLN
4.0000 mg | Freq: Once | INTRAMUSCULAR | Status: AC
  Administered 2014-01-03: 4 mg via INTRAVENOUS
  Filled 2014-01-03: qty 2

## 2014-01-03 MED ORDER — IOHEXOL 300 MG/ML  SOLN
100.0000 mL | Freq: Once | INTRAMUSCULAR | Status: AC | PRN
  Administered 2014-01-03: 100 mL via INTRAVENOUS

## 2014-01-03 MED ORDER — METRONIDAZOLE 500 MG PO TABS
500.0000 mg | ORAL_TABLET | Freq: Two times a day (BID) | ORAL | Status: DC
Start: 2014-01-03 — End: 2014-02-22

## 2014-01-03 MED ORDER — ONDANSETRON HCL 4 MG PO TABS: 4.0000 mg | ORAL_TABLET | Freq: Four times a day (QID) | ORAL | Status: DC

## 2014-01-03 MED ORDER — HYDROCODONE-ACETAMINOPHEN 5-325 MG PO TABS: 1.0000 | ORAL_TABLET | ORAL | Status: DC | PRN

## 2014-01-03 NOTE — ED Notes (Signed)
Pt to be written pain and nausea medications; awaiting PA at this time. Tiffany aware

## 2014-01-03 NOTE — ED Provider Notes (Signed)
CSN: [id number]     Arrival date & time 01/03/14  1426 History   First MD Initiated Contact with Patient 01/03/14 1555     Chief Complaint  Patient presents with  . Abdominal Pain  . Emesis     (Consider location/radiation/quality/duration/timing/severity/associated sxs/prior Treatment) HPI   Patient to the ED from [place] in [place] for further evaluation of her abdominal pain. She was told she has pancreatitis earlier this week at [facility] and had an outpatient abdominal US to which  she has not yet gotten the results of. They put her on a liquid diet. She did not get pain medications. She reports continuing to have pain and has been throwing up. The pain is periumbilical and radiates towards the epigastric, LUQ and RUQ. She is having break through vaginal bleeding and has not had a period in the past two years.She denies having weakness or fevers.  She denies dysuria.  Past Medical History  Diagnosis Date  . Migraine   . Fibromyalgia   . Asthma   . Kidney stones   . Ankylosing spondylitis    Past Surgical History  Procedure Laterality Date  . Wisdom tooth extraction    . Spinal tap     Family History  Problem Relation Age of Onset  . Hypertension Maternal Grandmother   . Arthritis Mother   . Kidney Stones Maternal Grandfather   . Thyroid disease Maternal Grandmother    History  Substance Use Topics  . Smoking status: Never Smoker   . Smokeless tobacco: Never Used  . Alcohol Use: 0.0 oz/week    0 Not specified per week     Comment: occ   OB History    No data available     Review of Systems  10 Systems reviewed and are negative for acute change except as noted in the HPI.   Allergies  Amoxicillin  Home Medications   Prior to Admission medications   Medication Sig Start Date End Date Taking? Authorizing Provider  albuterol (PROVENTIL HFA;VENTOLIN HFA) 108 (90 BASE) MCG/ACT inhaler Inhale 2 puffs into the lungs every 6 (six) hours as needed for wheezing or  shortness of breath.   Yes Historical Provider, MD  Cyanocobalamin (VITAMIN B12 PO) Take 1 tablet by mouth daily.    Yes Historical Provider, MD  etonogestrel (NEXPLANON) 68 MG IMPL implant 1 each by Subdermal route once.   Yes Historical Provider, MD  fluticasone (FLOVENT DISKUS) 50 MCG/BLIST diskus inhaler Inhale 1 puff into the lungs 2 (two) times daily.   Yes Historical Provider, MD  Mometasone Furoate (NASONEX NA) Place into the nose 2 (two) times daily.   Yes Historical Provider, MD  omeprazole (PRILOSEC) 20 MG capsule Take 1 capsule (20 mg total) by mouth daily. 12/30/13  Yes Gerhard Munchobert Lockwood, MD  SUMAtriptan (IMITREX) 100 MG tablet Take 100 mg by mouth every 2 (two) hours as needed for migraine or headache. May repeat in 2 hours if headache persists or recurs.   Yes Historical Provider, MD  metroNIDAZOLE (FLAGYL) 500 MG tablet Take 1 tablet (500 mg total) by mouth 2 (two) times daily. 01/03/14   Gitel Beste Irine SealG Esra Frankowski, PA-C  ondansetron (ZOFRAN ODT) 4 MG disintegrating tablet Take 1 tablet (4 mg total) by mouth every 8 (eight) hours as needed for nausea or vomiting. Patient not taking: Reported on 12/31/2013 12/30/13   Gerhard Munchobert Lockwood, MD  Topiramate ER (TROKENDI XR) 100 MG CP24 Take 100 mg by mouth daily. 12/21/13   Anson FretAntonia B Ahern, MD  BP 90/59 mmHg  Pulse 70  Temp(Src) 98.4 F (36.9 C) (Oral)  Resp 18  SpO2 97%  LMP 01/02/2014 Physical Exam  Constitutional: She appears well-developed and well-nourished. No distress.  HENT:  Head: Normocephalic and atraumatic.  Eyes: Pupils are equal, round, and reactive to light.  Neck: Normal range of motion. Neck supple.  Cardiovascular: Normal rate and regular rhythm.   Pulmonary/Chest: Effort normal.  Abdominal: Soft. Bowel sounds are normal. She exhibits no distension. There is tenderness (diffuse, worse periumbilical. ). There is no rigidity, no rebound, no guarding and no CVA tenderness.  Genitourinary: Uterus is tender. Uterus is not deviated and  not enlarged. Cervix exhibits no motion tenderness, no discharge and no friability. Right adnexum displays no mass and no tenderness. Left adnexum displays no mass and no tenderness. There is bleeding in the vagina. No vaginal discharge found.  Neurological: She is alert.  Skin: Skin is warm and dry.  Nursing note and vitals reviewed.   ED Course  Procedures (including critical care time) Labs Review Labs Reviewed  WET PREP, GENITAL - Abnormal; Notable for the following:    Clue Cells Wet Prep HPF POC FEW (*)    WBC, Wet Prep HPF POC FEW (*)    All other components within normal limits  URINALYSIS, ROUTINE W REFLEX MICROSCOPIC - Abnormal; Notable for the following:    Color, Urine AMBER (*)    APPearance CLOUDY (*)    Hgb urine dipstick LARGE (*)    All other components within normal limits  URINE MICROSCOPIC-ADD ON - Abnormal; Notable for the following:    Squamous Epithelial / LPF FEW (*)    Bacteria, UA FEW (*)    All other components within normal limits  GC/CHLAMYDIA PROBE AMP  CBC WITH DIFFERENTIAL  COMPREHENSIVE METABOLIC PANEL  LIPASE, BLOOD  POC URINE PREG, ED    Imaging Review Ct Abdomen Pelvis W Contrast  01/03/2014   CLINICAL DATA:  Patient with upper abdominal pain. Reports recent diagnosis of pancreatitis.  EXAM: CT ABDOMEN AND PELVIS WITH CONTRAST  TECHNIQUE: Multidetector CT imaging of the abdomen and pelvis was performed using the standard protocol following bolus administration of intravenous contrast.  CONTRAST:  100mL OMNIPAQUE IOHEXOL 300 MG/ML  SOLN  COMPARISON:  Ultrasound 01/01/2014  FINDINGS: Visualization of the lower thorax demonstrates dependent atelectasis. No consolidative or nodular pulmonary opacities. The heart is normal in size.  The liver is normal in size and contour without focal hepatic lesion identified. Gallbladder is unremarkable.  The spleen, pancreas and bilateral adrenal glands are unremarkable. Kidneys enhance symmetrically with contrast.  There is a 3 mm nonobstructing stone within the inferior pole of the left kidney. No hydronephrosis. Urinary bladder is unremarkable.  Normal caliber abdominal aorta. No retroperitoneal lymphadenopathy. Uterus is unremarkable. The ovaries are unremarkable.  No abnormal bowel wall thickening or evidence for bowel obstruction. Multiple sub cm right lower quadrant mesenteric lymph nodes. Normal appendix. No small bowel wall thickening or evidence for small bowel obstruction. No free fluid or free intraperitoneal air.  No aggressive or acute appearing osseous lesions.  IMPRESSION: Normal CT appearance of the pancreas.  Nonobstructing 3 mm stone inferior pole left kidney.  Normal appendix.   Electronically Signed   By: Annia Beltrew  Davis M.D.   On: 01/03/2014 20:22     EKG Interpretation None      MDM   Final diagnoses:  Abdominal pain  BV (bacterial vaginosis)    Patient had a thorough work-up in the ED,  she is having break through vaginal bleeding, and had uterine tenderness that shot pain up periumbilically. She has been nauseous. Her wet prep showed a few clue cells. Otherwise her blood work was not significant for any etiology of her pain. I will treat her with metronidazole and wait for GC cultures to return as this may be the etiology of her pain.  Patient was able to control her pain in the ED and requested foot as she is hungry. She tolerated this. Will give abx, pain and nausea medicine for home. She is referred to GI if this abx and pain medication does not work.  23 y.o.Angelica Kim's evaluation in the Emergency Department is complete. It has been determined that no acute conditions requiring further emergency intervention are present at this time. The patient/guardian have been advised of the diagnosis and plan. We have discussed signs and symptoms that warrant return to the ED, such as changes or worsening in symptoms.  Vital signs are stable at discharge. Filed Vitals:   01/03/14 2040  BP:    Pulse:   Temp: 98.4 F (36.9 C)  Resp:     Patient/guardian has voiced understanding and agreed to follow-up with the PCP or specialist.      Dorthula Matas, PA-C 01/03/14 2045  Toy Cookey, MD 01/04/14 1301

## 2014-01-03 NOTE — ED Notes (Signed)
Pt given crackers to eat

## 2014-01-03 NOTE — ED Notes (Signed)
Pt given ice chips per [place] PA

## 2014-01-03 NOTE — Discharge Instructions (Signed)
Bacterial Vaginosis °Bacterial vaginosis is a vaginal infection that occurs when the normal balance of bacteria in the vagina is disrupted. It results from an overgrowth of certain bacteria. This is the most common vaginal infection in women of childbearing age. Treatment is important to prevent complications, especially in pregnant women, as it can cause a premature delivery. °CAUSES  °Bacterial vaginosis is caused by an increase in harmful bacteria that are normally present in smaller amounts in the vagina. Several different kinds of bacteria can cause bacterial vaginosis. However, the reason that the condition develops is not fully understood. °RISK FACTORS °Certain activities or behaviors can put you at an increased risk of developing bacterial vaginosis, including: °· Having a new sex partner or multiple sex partners. °· Douching. °· Using an intrauterine device (IUD) for contraception. °Women do not get bacterial vaginosis from toilet seats, bedding, swimming pools, or contact with objects around them. °SIGNS AND SYMPTOMS  °Some women with bacterial vaginosis have no signs or symptoms. Common symptoms include: °· Grey vaginal discharge. °· A fishlike odor with discharge, especially after sexual intercourse. °· Itching or burning of the vagina and vulva. °· Burning or pain with urination. °DIAGNOSIS  °Your health care provider will take a medical history and examine the vagina for signs of bacterial vaginosis. A sample of vaginal fluid may be taken. Your health care provider will look at this sample under a microscope to check for bacteria and abnormal cells. A vaginal pH test may also be done.  °TREATMENT  °Bacterial vaginosis may be treated with antibiotic medicines. These may be given in the form of a pill or a vaginal cream. A second round of antibiotics may be prescribed if the condition comes back after treatment.  °HOME CARE INSTRUCTIONS  °· Only take over-the-counter or prescription medicines as  directed by your health care provider. °· If antibiotic medicine was prescribed, take it as directed. Make sure you finish it even if you start to feel better. °· Do not have sex until treatment is completed. °· Tell all sexual partners that you have a vaginal infection. They should see their health care provider and be treated if they have problems, such as a mild rash or itching. °· Practice safe sex by using condoms and only having one sex partner. °SEEK MEDICAL CARE IF:  °· Your symptoms are not improving after 3 days of treatment. °· You have increased discharge or pain. °· You have a fever. °MAKE SURE YOU:  °· Understand these instructions. °· Will watch your condition. °· Will get help right away if you are not doing well or get worse. °FOR MORE INFORMATION  °Centers for Disease Control and Prevention, Division of STD Prevention: www.cdc.gov/std °American Sexual Health Association (ASHA): www.ashastd.org  °Document Released: 01/15/2005 Document Revised: 11/05/2012 Document Reviewed: 08/27/2012 °ExitCare® Patient Information ©2015 [employer]. This information is not intended to replace advice given to you by your health care provider. Make sure you discuss any questions you have with your health care provider. ° ° °Abdominal Pain, Women °Abdominal (stomach, pelvic, or belly) pain can be caused by many things. It is important to tell your doctor: °· The location of the pain. °· Does it come and go or is it present all the time? °· Are there things that start the pain (eating certain foods, exercise)? °· Are there other symptoms associated with the pain (fever, nausea, vomiting, diarrhea)? °All of this is helpful to know when trying to find the cause of the pain. °CAUSES  °·   Stomach: virus or bacteria infection, or ulcer. °· Intestine: appendicitis (inflamed appendix), regional ileitis (Crohn's disease), ulcerative colitis (inflamed colon), irritable bowel syndrome, diverticulitis (inflamed diverticulum of  the colon), or cancer of the stomach or intestine. °· Gallbladder disease or stones in the gallbladder. °· Kidney disease, kidney stones, or infection. °· Pancreas infection or cancer. °· Fibromyalgia (pain disorder). °· Diseases of the female organs: °· Uterus: fibroid (non-cancerous) tumors or infection. °· Fallopian tubes: infection or tubal pregnancy. °· Ovary: cysts or tumors. °· Pelvic adhesions (scar tissue). °· Endometriosis (uterus lining tissue growing in the pelvis and on the pelvic organs). °· Pelvic congestion syndrome (female organs filling up with blood just before the menstrual period). °· Pain with the menstrual period. °· Pain with ovulation (producing an egg). °· Pain with an IUD (intrauterine device, birth control) in the uterus. °· Cancer of the female organs. °· Functional pain (pain not caused by a disease, may improve without treatment). °· Psychological pain. °· Depression. °DIAGNOSIS  °Your doctor will decide the seriousness of your pain by doing an examination. °· Blood tests. °· X-rays. °· Ultrasound. °· CT scan (computed tomography, special type of X-ray). °· MRI (magnetic resonance imaging). °· Cultures, for infection. °· Barium enema (dye inserted in the large intestine, to better view it with X-rays). °· Colonoscopy (looking in intestine with a lighted tube). °· Laparoscopy (minor surgery, looking in abdomen with a lighted tube). °· Major abdominal exploratory surgery (looking in abdomen with a large incision). °TREATMENT  °The treatment will depend on the cause of the pain.  °· Many cases can be observed and treated at home. °· Over-the-counter medicines recommended by your caregiver. °· Prescription medicine. °· Antibiotics, for infection. °· Birth control pills, for painful periods or for ovulation pain. °· Hormone treatment, for endometriosis. °· Nerve blocking injections. °· Physical therapy. °· Antidepressants. °· Counseling with a psychologist or psychiatrist. °· Minor or major  surgery. °HOME CARE INSTRUCTIONS  °· Do not take laxatives, unless directed by your caregiver. °· Take over-the-counter pain medicine only if ordered by your caregiver. Do not take aspirin because it can cause an upset stomach or bleeding. °· Try a clear liquid diet (broth or water) as ordered by your caregiver. Slowly move to a bland diet, as tolerated, if the pain is related to the stomach or intestine. °· Have a thermometer and take your temperature several times a day, and record it. °· Bed rest and sleep, if it helps the pain. °· Avoid sexual intercourse, if it causes pain. °· Avoid stressful situations. °· Keep your follow-up appointments and tests, as your caregiver orders. °· If the pain does not go away with medicine or surgery, you may try: °· Acupuncture. °· Relaxation exercises (yoga, meditation). °· Group therapy. °· Counseling. °SEEK MEDICAL CARE IF:  °· You notice certain foods cause stomach pain. °· Your home care treatment is not helping your pain. °· You need stronger pain medicine. °· You want your IUD removed. °· You feel faint or lightheaded. °· You develop nausea and vomiting. °· You develop a rash. °· You are having side effects or an allergy to your medicine. °SEEK IMMEDIATE MEDICAL CARE IF:  °· Your pain does not go away or gets worse. °· You have a fever. °· Your pain is felt only in portions of the abdomen. The right side could possibly be appendicitis. The left lower portion of the abdomen could be colitis or diverticulitis. °· You are passing blood in your stools (bright   red or black tarry stools, with or without vomiting). °· You have blood in your urine. °· You develop chills, with or without a fever. °· You pass out. °MAKE SURE YOU:  °· Understand these instructions. °· Will watch your condition. °· Will get help right away if you are not doing well or get worse. °Document Released: 11/12/2006 Document Revised: 06/01/2013 Document Reviewed: 12/02/2008 °ExitCare® Patient Information  ©2015 [employer]. This information is not intended to replace advice given to you by your health care provider. Make sure you discuss any questions you have with your health care provider. ° °

## 2014-01-03 NOTE — ED Notes (Signed)
Pt c/o upper abdominal pain x 4days ago. Was seen at [facility] and diagnosed with pancreatitis. Pt states she hasn't been able to eat anything over the weekend. Pt was been drinking Gatorade and keeping it down. Pt was given PO Zofran prescription in which "doesn't help." Pt also c/o bilateral flank pain; has known kidney stones.

## 2014-01-03 NOTE — ED Notes (Signed)
CT notified of pt finishing contrast. 

## 2014-01-03 NOTE — ED Notes (Signed)
Pt reports being seen at [facility] earlier this week due to upper abd pain, was diagnosed with pancreatitis and told to drink fluids and then start bland diet this weekend. Reports still having pain and unable to tolerate broth and toast, having n/v.

## 2014-01-03 NOTE — ED Notes (Signed)
Pt reports the contrast "burns on the this side (pt pointing to LUQ)."

## 2014-01-04 LAB — GC/CHLAMYDIA PROBE AMP
CT PROBE, AMP APTIMA: NEGATIVE
GC PROBE AMP APTIMA: NEGATIVE

## 2014-01-11 ENCOUNTER — Encounter (INDEPENDENT_AMBULATORY_CARE_PROVIDER_SITE_OTHER): Payer: Self-pay | Admitting: *Deleted

## 2014-02-22 ENCOUNTER — Encounter (INDEPENDENT_AMBULATORY_CARE_PROVIDER_SITE_OTHER): Payer: Self-pay | Admitting: Internal Medicine

## 2014-02-22 ENCOUNTER — Ambulatory Visit (INDEPENDENT_AMBULATORY_CARE_PROVIDER_SITE_OTHER): Payer: BC Managed Care – PPO | Admitting: Internal Medicine

## 2014-02-22 VITALS — BP 108/52 | HR 64 | Temp 97.4°F | Ht 60.0 in | Wt 172.7 lb

## 2014-02-22 DIAGNOSIS — K219 Gastro-esophageal reflux disease without esophagitis: Secondary | ICD-10-CM

## 2014-02-22 MED ORDER — OMEPRAZOLE 20 MG PO TBEC: 20.0000 mg | DELAYED_RELEASE_TABLET | ORAL | Status: DC

## 2014-02-22 NOTE — Patient Instructions (Signed)
Omeprazole 20mg daily.  °

## 2014-02-22 NOTE — Progress Notes (Signed)
Subjective:    Patient ID: Angelica Kim, female    DOB: November 01, 1990, 24 y.o.   MRN: [id number]  HPI Here today for f/u.  She tells me she is doing good. Occasionally has epigastric burning after eating. She feels good. Appetite is good. No weight loss. BMs are normal.   Recent lipase in December was normal.  No taking any NSAIDs.    01/03/2014 CT abdomen/pelvis with CM:  IMPRESSION: Normal CT appearance of the pancreas.  Nonobstructing 3 mm stone inferior pole left kidney.  Normal appendix.   Lipase     Component Value Date/Time   LIPASE 54 01/03/2014 1440   CBC    Component Value Date/Time   WBC 10.5 01/03/2014 1440   RBC 4.98 01/03/2014 1440   HGB 14.7 01/03/2014 1440   HCT 42.9 01/03/2014 1440   PLT 306 01/03/2014 1440   MCV 86.1 01/03/2014 1440   MCH 29.5 01/03/2014 1440   MCHC 34.3 01/03/2014 1440   RDW 12.4 01/03/2014 1440   LYMPHSABS 3.0 01/03/2014 1440   MONOABS 0.8 01/03/2014 1440   EOSABS 0.1 01/03/2014 1440   BASOSABS 0.0 01/03/2014 1440   Urinalysis    Component Value Date/Time   COLORURINE AMBER* 01/03/2014 1616   APPEARANCEUR CLOUDY* 01/03/2014 1616   LABSPEC 1.024 01/03/2014 1616   PHURINE 6.5 01/03/2014 1616   GLUCOSEU NEGATIVE 01/03/2014 1616   HGBUR LARGE* 01/03/2014 1616   BILIRUBINUR NEGATIVE 01/03/2014 1616   KETONESUR NEGATIVE 01/03/2014 1616   PROTEINUR NEGATIVE 01/03/2014 1616   UROBILINOGEN 1.0 01/03/2014 1616   NITRITE NEGATIVE 01/03/2014 1616   LEUKOCYTESUR NEGATIVE 01/03/2014 1616        Review of Systems     Past Medical History  Diagnosis Date  . Migraine   . Fibromyalgia   . Asthma   . Kidney stones   . Ankylosing spondylitis     Past Surgical History  Procedure Laterality Date  . Wisdom tooth extraction    . Spinal tap      Allergies  Allergen Reactions  . Amoxicillin Hives, Shortness Of Breath and Swelling    Current Outpatient Prescriptions on File Prior to Visit  Medication Sig Dispense Refill   . albuterol (PROVENTIL HFA;VENTOLIN HFA) 108 (90 BASE) MCG/ACT inhaler Inhale 2 puffs into the lungs every 6 (six) hours as needed for wheezing or shortness of breath.    . Cyanocobalamin (VITAMIN B12 PO) Take 1 tablet by mouth daily.     Marland Kitchen etonogestrel (NEXPLANON) 68 MG IMPL implant 1 each by Subdermal route once.    . fluticasone (FLOVENT DISKUS) 50 MCG/BLIST diskus inhaler Inhale 1 puff into the lungs 2 (two) times daily.    . Mometasone Furoate (NASONEX NA) Place into the nose 2 (two) times daily.    . SUMAtriptan (IMITREX) 100 MG tablet Take 100 mg by mouth every 2 (two) hours as needed for migraine or headache. May repeat in 2 hours if headache persists or recurs.     No current facility-administered medications on file prior to visit.     Objective:   Physical Exam  Filed Vitals:   02/22/14 1529  Height: 5' (1.524 m)  Weight: 172 lb 11.2 oz (78.336 kg)   Alert and oriented. Skin warm and dry. Oral mucosa is moist.   . Sclera anicteric, conjunctivae is pink. Thyroid not enlarged. No cervical lymphadenopathy. Lungs clear. Heart regular rate and rhythm.  Abdomen is soft. Bowel sounds are positive. No hepatomegaly. No abdominal masses felt. No tenderness.  No edema to lower extremities.          Assessment & Plan:  Abdominal pain: resolved. ? GERD. Needs to start taking the Omeprazole again. Start Omeprazole 20mg  daily 30 minutes before breakfast.

## 2014-03-23 ENCOUNTER — Encounter: Payer: Self-pay | Admitting: Neurology

## 2014-03-23 ENCOUNTER — Ambulatory Visit (INDEPENDENT_AMBULATORY_CARE_PROVIDER_SITE_OTHER): Payer: BC Managed Care – PPO | Admitting: Neurology

## 2014-03-23 VITALS — BP 109/78 | HR 88 | Ht 60.0 in | Wt 174.0 lb

## 2014-03-23 DIAGNOSIS — G43909 Migraine, unspecified, not intractable, without status migrainosus: Secondary | ICD-10-CM

## 2014-03-23 MED ORDER — PREGABALIN 50 MG PO CAPS: 50.0000 mg | ORAL_CAPSULE | Freq: Two times a day (BID) | ORAL | Status: DC

## 2014-03-23 NOTE — Patient Instructions (Signed)
Overall you are doing fairly well but I do want to suggest a few things today:   Remember to drink plenty of fluid, eat healthy meals and do not skip any meals. Try to eat protein with a every meal and eat a healthy snack such as fruit or nuts in between meals. Try to keep a regular sleep-wake schedule and try to exercise daily, particularly in the form of walking, 20-30 minutes a day, if you can.   As far as your medications are concerned, I would like to suggest: Lyrica 50mg  twice daily and follow up with Psychiatry  I would like to see you back in 6 months, sooner if we need to. Please call us with any interim questions, concerns, problems, updates or refill requests.   Please also call us for any test results so we can go over those with you on the phone.  My clinical assistant and will answer any of your questions and relay your messages to me and also relay most of my messages to you.   Our phone number is [phone]. We also have an after hours call service for urgent matters and there is a physician on-call for urgent questions. For any emergencies you know to call 911 or go to the nearest emergency room

## 2014-03-23 NOTE — Progress Notes (Signed)
[facility]    Provider:  Dr Lucia Gaskins Referring Provider: No ref. provider found Primary Care Physician:  No PCP Per Patient  CC:  Migraine   Interval update 03/24/2014:  Angelica Kim is a 24 y.o. female here as a follow up. She did not tolerate the Topamax. She still has the headaches 5-6x a month. Inmitrex helps very little and doesn't take it away. They last hours even with the imitrex.  Has to lay down. She is still having the sharp shooting pain going up her back. Her MRIs were normal. LP was normal. She is having very bad anxiety lately. She is very irritable. Her heart starts racing, she can't breath. She has an appointment in March with psychiatry.   Initial visit 12/22/2014: Angelica Kim is a 24 y.o. female here as a referral from Dr. Su Hilt for Migraines. She just relocated to the area. Migraines started 3 years ago and is on imitrex for acute management. She has been seeing a Insurance account manager in [place], [place]. Headaches feel like someone is beating her in the head, starts behind the right eye and pulsating and then spreads to the whole head. The imitrex helps but still has the headache afterwards, she has to lay down in the dark for 3 hours after taking the imitrex. +Phonophobia more than photophobia, no nausea. Has them 5-6 a month and they can last about 12 hours without Imitrex, Imitrex helps and they last 3 hours after taking that. She had a CT scan in the past and found white matter changes but lumbar puncture with workup was normal. No known triggers for migraines but worse with sinus congestion. Takes ibuprofen for back pain prn. Did have low B12 and Vitamin D but was recently tested and is normal, takes daily supplementation. She has had multiple Cat Scans and possibly MRIs of the brain. Is a Engineer, site. She feels her memory is worsening. She has LBP and radicular symptoms into both legs, worse with temperature changes like in the heat or in the tub. Has had MRIs of low  back. Worse with standing or from sitting too long. Sleeping varies, sometimes has insomnia. Is forgetting things easier than she used, is repeating herself more. Asks the same questions over. Is a new teacher, a little stressful. Is getting worse. Denies depression, sadness most days. Has a rheumatologist because tested positive for autoimmune disorder but they can't figure out which one. Right facial twitching 20 minutes before onset.   Review of Systems: Patient complains of symptoms per HPI as well as the following symptoms joint pain, joint swelling, back pain, daytime sleepiness, nervous/anxious, fatigue, cough. Pertinent negatives per HPI. All others negative.   History   Social History  . Marital Status: Single    Spouse Name: N/A  . Number of Children: 0  . Years of Education: Masters   Occupational History  .  [employer]   Social History Main Topics  . Smoking status: Never Smoker   . Smokeless tobacco: Never Used  . Alcohol Use: 0.0 oz/week    0 Standard drinks or equivalent per week     Comment: occ  . Drug Use: No  . Sexual Activity: Not on file   Other Topics Concern  . Not on file   Social History Narrative   Patient drinks caffeine moderate    Patient is single   Patient lives alone    Patient has no children    Patient has a Master's degree    Caffeine:  soda (3 12oz per day)    Family History  Problem Relation Age of Onset  . Hypertension Maternal Grandmother   . Arthritis Mother   . Kidney Stones Maternal Grandfather   . Thyroid disease Maternal Grandmother     Past Medical History  Diagnosis Date  . Migraine   . Fibromyalgia   . Asthma   . Kidney stones   . Ankylosing spondylitis     Past Surgical History  Procedure Laterality Date  . Wisdom tooth extraction    . Spinal tap      Current Outpatient Prescriptions  Medication Sig Dispense Refill  . albuterol (PROVENTIL HFA;VENTOLIN HFA) 108 (90 BASE) MCG/ACT inhaler Inhale 2 puffs  into the lungs every 6 (six) hours as needed for wheezing or shortness of breath.    . Cyanocobalamin (VITAMIN B12 PO) Take 1 tablet by mouth daily.     Marland Kitchen. etonogestrel (NEXPLANON) 68 MG IMPL implant 1 each by Subdermal route once.    . fluticasone (FLOVENT DISKUS) 50 MCG/BLIST diskus inhaler Inhale 1 puff into the lungs 2 (two) times daily.    . Mometasone Furoate (NASONEX NA) Place into the nose 2 (two) times daily.    . SUMAtriptan (IMITREX) 100 MG tablet Take 100 mg by mouth every 2 (two) hours as needed for migraine or headache. May repeat in 2 hours if headache persists or recurs.    . Omeprazole ([facility] OMEPRAZOLE) 20 MG TBEC Take 1 tablet (20 mg total) by mouth See admin instructions. (Patient not taking: Reported on 03/23/2014) 30 each 3   No current facility-administered medications for this visit.    Allergies as of 03/23/2014 - Review Complete 03/23/2014  Allergen Reaction Noted  . Amoxicillin Hives, Shortness Of Breath, and Swelling 12/18/2013    Vitals: BP 109/78 mmHg  Pulse 88  Ht 5' (1.524 m)  Wt 174 lb (78.926 kg)  BMI 33.98 kg/m2 Last Weight:  Wt Readings from Last 1 Encounters:  03/23/14 174 lb (78.926 kg)   Last Height:   Ht Readings from Last 1 Encounters:  03/23/14 5' (1.524 m)    Speech:  Speech is normal; fluent and spontaneous with normal comprehension.  Cognition:  The patient is oriented to person, place, and time;   recent and remote memory intact;   language fluent;   normal attention, concentration,   fund of knowledge Cranial Nerves:  The pupils are equal, round, and reactive to light. The fundi are normal and spontaneous venous pulsations are present. Visual fields are full to finger confrontation. Extraocular movements are intact. Trigeminal sensation is intact and the muscles of mastication are normal. The face is symmetric. The palate elevates in the midline. Voice is normal. Shoulder shrug is normal. The tongue has normal  motion without fasciculations.       Assessment/Plan:  24 year old female here to establish care for migraine disorder. Has migraines 5-6x a month. Is on Imitrex with poor relief. Discussed daily vs acute management and side effects of multiple ppx medications. Failed Topamax. Continue Imitrex.    Will try Lyrica for back pain as well as her headache Zecuity for acute management Follow up with psychiatry  Naomie DeanAntonia Ahern, MD  [facility] [address] [place], [place] [address]  Phone [phone] Fax [phone]  A total of 15 minutes was spent face-to-face with this patient. Over half this time was spent on counseling patient on the migraine diagnosis and different diagnostic and therapeutic options available.

## 2014-03-25 ENCOUNTER — Telehealth: Payer: Self-pay | Admitting: *Deleted

## 2014-03-25 NOTE — Telephone Encounter (Signed)
I faxed over the form at 8:57 am to [employer] for appoval of Zecuity.

## 2014-04-07 ENCOUNTER — Ambulatory Visit (INDEPENDENT_AMBULATORY_CARE_PROVIDER_SITE_OTHER): Payer: BC Managed Care – PPO | Admitting: Psychiatry

## 2014-04-07 ENCOUNTER — Encounter (HOSPITAL_COMMUNITY): Payer: Self-pay | Admitting: Psychiatry

## 2014-04-07 VITALS — BP 101/66 | HR 100 | Ht 60.0 in | Wt 172.4 lb

## 2014-04-07 DIAGNOSIS — F411 Generalized anxiety disorder: Secondary | ICD-10-CM | POA: Insufficient documentation

## 2014-04-07 MED ORDER — CLONAZEPAM 0.5 MG PO TABS: 0.5000 mg | ORAL_TABLET | Freq: Every day | ORAL | Status: DC | PRN

## 2014-04-07 MED ORDER — ESCITALOPRAM OXALATE 10 MG PO TABS: 10.0000 mg | ORAL_TABLET | Freq: Every day | ORAL | Status: DC

## 2014-04-07 NOTE — Progress Notes (Signed)
Psychiatric Assessment Adult  Patient Identification:  Angelica Kim Date of Evaluation:  04/07/2014 Chief Complaint: I stay stressed and anxious History of Chief Complaint:   Chief Complaint  Patient presents with  . Anxiety  . ADHD  . Establish Care    HPI this patient is a 24 year old single biracial female who lives alone in [place]. Her aunt and uncle live nearby. 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 She is self-referred.  The patient states that she's very anxious and gets stressed easily. She is always been like this to some degree but it got worse after a 24 year old student in her school hung himself at the end of September. He was found in his closet at home. He did not have any history of mental illness and there was no indication that he was having problems. 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 Now when someone calls her name or she is asked to meet with someone in her heart starts racing and she gets very nervous and stressed. Sometimes she feels very jittery but has not had overt panic attacks. She did have ADHD as a child but after middle school she no longer took medication and seemed to do fairly well without it. She tends to procrastinate and has difficulty planning and was put on an improvement plan at work to better her planning. She is doing better now.  The patient also has had some odd medical problems. She has severe migraine headaches 5-6 times a month. She has ankle dosing spondylitis and fibromyalgia. She sometimes forgets things and has memory loss she apparently has had an MRI that showed some white matter disease but her LP was negative. She is followed by a neurologist [facility]. 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 She has close friends back in [place] and has met a few people here and is close to her aunt and uncle. She doesn't do well with change and cries when she has to come back from [place] after vacation. She's not had any treatment for mental illness other than the ADHD in childhood and has never been suicidal or psychotic. She does not abuse drugs is not currently sexually active and rarely drinks. She is worn out from school and often sleeps 3-4 hours right after work and then it goes back to sleep at night Review of Systems  Constitutional: Positive for fatigue.  Eyes: Negative.   Respiratory: Negative.   Cardiovascular: Negative.   Gastrointestinal: Negative.   Endocrine: Negative.   Genitourinary: Negative.   Musculoskeletal: Positive for myalgias, joint swelling and arthralgias.  Skin: Negative.   Allergic/Immunologic: Negative.   Neurological: Positive for headaches.  Hematological: Negative.   Psychiatric/Behavioral: Positive for decreased concentration. The patient is nervous/anxious.    Physical Examnot done  Depressive Symptoms: fatigue, difficulty concentrating, impaired memory, anxiety,  (Hypo) Manic Symptoms:   Elevated Mood:  No Irritable Mood:  No Grandiosity:  No Distractibility:  No Labiality of Mood:  No Delusions:  No Hallucinations:  No Impulsivity:  No Sexually Inappropriate Behavior:  No Financial Extravagance:  No Flight of Ideas:  No  Anxiety Symptoms: Excessive Worry:  Yes Panic Symptoms:  Yes Agoraphobia:  No Obsessive Compulsive: No  Symptoms: None, Specific Phobias:  No Social Anxiety:  No  Psychotic Symptoms:  Hallucinations: No None Delusions:  No Paranoia:  No   Ideas of Reference:  No  PTSD Symptoms: Ever had a traumatic exposure:  No Had a traumatic exposure in the last month:  No Re-experiencing: No None Hypervigilance:  No Hyperarousal: No None Avoidance: No None  Traumatic Brain Injury: Yes Sports  Related  Past Psychiatric History: Diagnosis: ADHD in childhood   Hospitalizations: none  Outpatient Care: Saw a therapist as a child   Substance Abuse Care: none  Self-Mutilation: none  Suicidal Attempts: none  Violent Behaviors: none   Past Medical History:   Past Medical History  Diagnosis Date  . Migraine   . Fibromyalgia   . Asthma   . Kidney stones   . Ankylosing spondylitis    History of Loss of Consciousness:  No Seizure History:  No Cardiac History:  No Allergies:   Allergies  Allergen Reactions  . Amoxicillin Hives, Shortness Of Breath and Swelling   Current Medications:  Current Outpatient Prescriptions  Medication Sig Dispense Refill  . albuterol (PROVENTIL HFA;VENTOLIN HFA) 108 (90 BASE) MCG/ACT inhaler Inhale 2 puffs into the lungs every 6 (six) hours as needed for wheezing or shortness of breath.    . Cyanocobalamin (VITAMIN B12 PO) Take 1 tablet by mouth daily.     Marland Kitchen etonogestrel (NEXPLANON) 68 MG IMPL implant 1 each by Subdermal route once.    . fluticasone (FLOVENT DISKUS) 50 MCG/BLIST diskus inhaler Inhale 1 puff into the lungs 2 (two) times daily.    . Mometasone Furoate (NASONEX NA) Place into the nose 2 (two) times daily.    . pregabalin (LYRICA) 50 MG capsule Take 1 capsule (50 mg total) by mouth 2 (two) times daily. 42 capsule 0  . SUMAtriptan (IMITREX) 100 MG tablet Take 100 mg by mouth every 2 (two) hours as needed for migraine or headache. May repeat in 2 hours if headache persists or recurs.    . clonazePAM (KLONOPIN) 0.5 MG tablet Take 1 tablet (0.5 mg total) by mouth daily as needed for anxiety. 30 tablet 2  . escitalopram (LEXAPRO) 10 MG tablet Take 1 tablet (10 mg total) by mouth daily. 30 tablet 2   No current facility-administered medications for this visit.    Previous Psychotropic Medications:  Medication Dose   Ritalin                        Substance Abuse History in the last 12 months: Substance Age of 1st Use Last Use  Amount Specific Type  Nicotine      Alcohol    drinks only occasionally    Cannabis      Opiates      Cocaine      Methamphetamines      LSD      Ecstasy      Benzodiazepines      Caffeine      Inhalants      Others:                          Medical Consequences of Substance Abuse: none   Legal Consequences of Substance Abuse: none  Family Consequences of Substance Abuse: none  Blackouts:  No DT's:  No Withdrawal Symptoms:  No None  Social History: Current Place of Residence: [place] of Birth: [place] Family Members: Mother, grandparents, aunt and uncle Marital Status:  Single  Children: none  Sons:   Daughters:  Relationships: Not dating Education:  Soil scientist Problems/Performance: Had ADHD as a child Religious Beliefs/Practices: Unknown History of Abuse: none Occupational Experiences; EC Restaurant manager, fast food History:  None. Legal History: none Hobbies/Interests: Rescue squad, fishing Family History:   Family History  Problem Relation Age of Onset  . Hypertension Maternal Grandmother   . Arthritis Mother   . Kidney Stones Maternal Grandfather   . Thyroid disease Maternal Grandmother     Mental Status Examination/Evaluation: Objective:  Appearance: Casual, Neat and Well Groomed  Eye Contact::  Good  Speech:  Clear and Coherent  Volume:  Normal  Mood:  Anxious   Affect:  Congruent  Thought Process:  Goal Directed  Orientation:  Full (Time, Place, and Person)  Thought Content:  Rumination  Suicidal Thoughts:  No  Homicidal Thoughts:  No  Judgement:  Good  Insight:  Fair  Psychomotor Activity:  Normal  Akathisia:  No  Handed:  Right  AIMS (if indicated):    Assets:  Communication Skills Desire for Improvement Resilience Social Support Vocational/Educational    Laboratory/X-Ray Psychological Evaluation(s)   CBC and basic metabolic panel within normal limits      Assessment:  Axis I: Generalized  Anxiety Disorder  AXIS I Generalized Anxiety Disorder  AXIS II Deferred  AXIS III Past Medical History  Diagnosis Date  . Migraine   . Fibromyalgia   . Asthma   . Kidney stones   . Ankylosing spondylitis      AXIS IV other psychosocial or environmental problems  AXIS V 51-60 moderate symptoms   Treatment Plan/Recommendations:  Plan of Care: Medication management   Laboratory:    Psychotherapy: She'll be assigned a therapist here   Medications: She'll start Lexapro 10 mg daily for anxiety and clonazepam 0.5 mg when necessary daily for severe anxiety or panic   Routine PRN Medications:  Yes  Consultations:   Safety Concerns:  She denies thoughts of harm to self or others   Other: She'll return in 4 weeks     Levonne Spiller, MD 3/9/20169:27 AM

## 2014-04-22 ENCOUNTER — Encounter (HOSPITAL_COMMUNITY): Payer: Self-pay | Admitting: Psychiatry

## 2014-04-22 ENCOUNTER — Ambulatory Visit (INDEPENDENT_AMBULATORY_CARE_PROVIDER_SITE_OTHER): Payer: BC Managed Care – PPO | Admitting: Psychiatry

## 2014-04-22 DIAGNOSIS — F411 Generalized anxiety disorder: Secondary | ICD-10-CM | POA: Diagnosis not present

## 2014-04-22 NOTE — Progress Notes (Signed)
Patient:   Angelica Kim   DOB:   May 21, 1990  MR Number:  [id number]  Location:  [address], [place], [place] [address]  Date of Service:   Thursday 04/22/2014  Start Time:   3:15 PM End Time:   4:05 PM  Provider/Observer:  Florencia ReasonsPeggy Trellis Vanoverbeke, MSW, LCSW   Billing Code/Service:  [phone]  Chief Complaint:     Chief Complaint  Patient presents with  . Anxiety    Reason for Service:  The patient is referred for services by psychiatrist Dr. Tenny Crawoss to improve coping skills. Patient states having anxiety really bad, particularly about the unknown.  Patient states she has always needed to know what is going on and doesn't like surprises.Heart speeds up and stays that way until she finds out what is going on. 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  He was 24 years old. 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  [place] from [place] in August [place]. 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 In October, 2015 she got a dog and started her volunteer position with the [employer].   Current Status:  Patient reports anxiety, sleep difficulty, memory difficulty, irritability, excessive worry, and low energy.  Reliability of Information: Information gathered from patient and medical record.    Behavioral Observation: Angelica Kim  presents as a 24 y.o.-year-old Right -handed biracial Female who appeared her stated age. Her dress was appropriate and she was casual in appearance. Her  manners were appropriate to the situation.  There were not any physical disabilities noted.  She displayed an  appropriate level of cooperation and motivation.    Interactions:    Active   Attention:   normal  Memory:   within normal limits  Visuo-spatial:   not examined  Speech (Volume):  normal  Speech:   normal pitch and normal volume  Thought Process:  Coherent and Relevant  Though Content:  Rumination  Orientation:   person, place, time/date, situation, day of week, month of year and year  Judgment:   Good  Planning:   Good  Affect:    Anxious  Mood:    Anxious  Insight:   Good  Intelligence:   normal  Marital Status/Living: Patient was born in [place]. Her mother was in the army and patient states growing up all over but mainly growing up in [place] with grandparents when in 8th grade as mother was still traveling with the [employer] and Companymilitary. Patient's parents divorced when patient was 24 years old. Patient does not remember father as he disappeared when he went AWOL from the [employer] and Compan[employer]ilitary. Patient describes household in childhood as fine and being very close with mother. Patient is single. Patient is Catholic. 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   Education:   Masters in Programme researcher, broadcasting/film/videoducation from [employer]   Medical History:  Past Medical History  Diagnosis Date  . Migraine   . Fibromyalgia   . Asthma   . Kidney stones   . Ankylosing spondylitis     Sexual History:   History  Sexual Activity  . Sexual Activity: Not Currently    Abuse/Trauma History: Death of student in 06-01-2011 and death of another student in 31-May-2013  Psychiatric History:  Patient reports no psychiatric hospitalizations. Patient participated in outpatient therapy for about a year due to having ADHD . Patient reports taking ritalin for about a year  - didn't like the way it made her feel. Patient currently is taking klnopin and lexapro as prescribed by psychiatrist Dr. Tenny Craw.   Family Med/Psych History:  Family History  Problem Relation Age of Onset  . Hypertension Maternal Grandmother   . Arthritis Mother   . Kidney Stones Maternal Grandfather   . Thyroid disease Maternal Grandmother     Risk of Suicide/Violence: Patient denies past and current suicidal and homicidal ideations. She reports no pattern of self-injurious behaviors or patterns of aggresson or violence.   Legal Issues:   None  Impression/DX:  Patient presents with symptoms of anxiety that appear to have begun in childhood. She reports fear of the unknown and heart racing when she doesn't know something. Her symptoms worsened after one of her students completed suicide by hanging himself in October 2015. Patient's current symptoms include anxiety, sleep difficulty, memory difficulty, irritability, excessive worry, and low energy. Diagnosis: Generalized anxiety disorder    Disposition/Plan:  Patient attends the assessment appointment today. Confidentiality and limits are discussed. The patient agrees to return for an appointment in 2 weeks for continuing assessment and treatment planning. Patient also agrees to call this practice, call 911, or have someone take her to the emergency room should symptoms worsen.  Diagnosis:    Axis I:  Generalized anxiety disorder      Axis II: No diagnosis       Axis III:   Past Medical History  Diagnosis Date  . Migraine   . Fibromyalgia   . Asthma   . Kidney stones   . Ankylosing spondylitis         Axis IV:  other psychosocial or environmental problems          Axis V:  51-60 moderate symptoms          Sheelah Ritacco, LCSW

## 2014-04-22 NOTE — Patient Instructions (Signed)
Discussed orally 

## 2014-05-05 ENCOUNTER — Ambulatory Visit (HOSPITAL_COMMUNITY): Payer: Self-pay | Admitting: Psychiatry

## 2014-05-05 ENCOUNTER — Encounter (HOSPITAL_COMMUNITY): Payer: Self-pay | Admitting: Psychiatry

## 2014-06-03 ENCOUNTER — Telehealth: Payer: Self-pay

## 2014-06-03 NOTE — Telephone Encounter (Signed)
Pt was contacted concerning rescheduling appointment, pt will call back to schedule appt, when pt calls please reschedule with Dr. Lucia GaskinsAhern in 15 min slot

## 2014-09-21 ENCOUNTER — Ambulatory Visit: Payer: BC Managed Care – PPO | Admitting: Neurology

## 2014-10-26 ENCOUNTER — Encounter (INDEPENDENT_AMBULATORY_CARE_PROVIDER_SITE_OTHER): Payer: Self-pay | Admitting: *Deleted

## 2015-02-16 ENCOUNTER — Telehealth: Payer: Self-pay | Admitting: Neurology

## 2015-02-16 MED ORDER — SUMATRIPTAN SUCCINATE 100 MG PO TABS
ORAL_TABLET | ORAL | Status: DC
Start: 2015-02-16 — End: 2018-12-17

## 2015-02-16 NOTE — Telephone Encounter (Signed)
LVM to let to know we send rx refill to her pharmacy. Gave [facility] phone number if she has further questions.

## 2015-02-16 NOTE — Telephone Encounter (Signed)
Patient called to request refill of SUMAtriptan (IMITREX) 100 MG tablet, [facility], [place].

## 2015-02-23 ENCOUNTER — Encounter (INDEPENDENT_AMBULATORY_CARE_PROVIDER_SITE_OTHER): Payer: Self-pay | Admitting: Internal Medicine

## 2015-02-23 ENCOUNTER — Ambulatory Visit (INDEPENDENT_AMBULATORY_CARE_PROVIDER_SITE_OTHER): Payer: Self-pay | Admitting: Internal Medicine

## 2015-06-13 ENCOUNTER — Ambulatory Visit: Payer: BC Managed Care – PPO | Admitting: Neurology

## 2015-08-31 ENCOUNTER — Encounter (HOSPITAL_COMMUNITY): Payer: Self-pay | Admitting: Emergency Medicine

## 2015-08-31 ENCOUNTER — Emergency Department (HOSPITAL_COMMUNITY): Payer: BC Managed Care – PPO

## 2015-08-31 ENCOUNTER — Emergency Department (HOSPITAL_COMMUNITY)
Admission: EM | Admit: 2015-08-31 | Discharge: 2015-08-31 | Disposition: A | Discharge status: Home / Self Care | Payer: BC Managed Care – PPO | Attending: Emergency Medicine | Admitting: Emergency Medicine

## 2015-08-31 DIAGNOSIS — R112 Nausea with vomiting, unspecified: Secondary | ICD-10-CM | POA: Insufficient documentation

## 2015-08-31 DIAGNOSIS — Z79899 Other long term (current) drug therapy: Secondary | ICD-10-CM | POA: Diagnosis not present

## 2015-08-31 DIAGNOSIS — J45909 Unspecified asthma, uncomplicated: Secondary | ICD-10-CM | POA: Insufficient documentation

## 2015-08-31 DIAGNOSIS — R109 Unspecified abdominal pain: Secondary | ICD-10-CM | POA: Diagnosis present

## 2015-08-31 LAB — CBC WITH DIFFERENTIAL/PLATELET
Basophils Absolute: 0 10*3/uL (ref 0.0–0.1)
Basophils Relative: 0 %
EOS ABS: 0.2 10*3/uL (ref 0.0–0.7)
Eosinophils Relative: 2 %
HCT: 38.9 % (ref 36.0–46.0)
Hemoglobin: 13.3 g/dL (ref 12.0–15.0)
LYMPHS ABS: 5.2 10*3/uL — AB (ref 0.7–4.0)
Lymphocytes Relative: 42 %
MCH: 29.4 pg (ref 26.0–34.0)
MCHC: 34.2 g/dL (ref 30.0–36.0)
MCV: 85.9 fL (ref 78.0–100.0)
Monocytes Absolute: 0.8 10*3/uL (ref 0.1–1.0)
Monocytes Relative: 6 %
Neutro Abs: 6.3 10*3/uL (ref 1.7–7.7)
Neutrophils Relative %: 50 %
Platelets: 261 10*3/uL (ref 150–400)
RBC: 4.53 MIL/uL (ref 3.87–5.11)
RDW: 12.7 % (ref 11.5–15.5)
WBC: 12.5 10*3/uL — ABNORMAL HIGH (ref 4.0–10.5)

## 2015-08-31 LAB — COMPREHENSIVE METABOLIC PANEL
ALBUMIN: 4 g/dL (ref 3.5–5.0)
ALK PHOS: 93 U/L (ref 38–126)
ALT: 14 U/L (ref 14–54)
AST: 16 U/L (ref 15–41)
Anion gap: 3 — ABNORMAL LOW (ref 5–15)
BUN: 11 mg/dL (ref 6–20)
CALCIUM: 9.1 mg/dL (ref 8.9–10.3)
CO2: 25 mmol/L (ref 22–32)
CREATININE: 0.7 mg/dL (ref 0.44–1.00)
Chloride: 111 mmol/L (ref 101–111)
GFR calc Af Amer: >60 mL/min (ref >60)
GFR calc non Af Amer: >60 mL/min (ref >60)
Glucose, Bld: 105 mg/dL — ABNORMAL HIGH (ref 65–99)
Potassium: 3.3 mmol/L — ABNORMAL LOW (ref 3.5–5.1)
SODIUM: 139 mmol/L (ref 135–145)
TOTAL PROTEIN: 6.9 g/dL (ref 6.5–8.1)
Total Bilirubin: 0.2 mg/dL — ABNORMAL LOW (ref 0.3–1.2)

## 2015-08-31 LAB — URINALYSIS, ROUTINE W REFLEX MICROSCOPIC
Bilirubin Urine: NEGATIVE
Glucose, UA: NEGATIVE mg/dL
Hgb urine dipstick: NEGATIVE
KETONES UR: NEGATIVE mg/dL
Leukocytes, UA: NEGATIVE
NITRITE: NEGATIVE
Protein, ur: NEGATIVE mg/dL
pH: 6 (ref 5.0–8.0)

## 2015-08-31 LAB — LIPASE, BLOOD: Lipase: 46 U/L (ref 11–51)

## 2015-08-31 LAB — PREGNANCY, URINE: Preg Test, Ur: NEGATIVE

## 2015-08-31 MED ORDER — CIPROFLOXACIN HCL 500 MG PO TABS: 500.0000 mg | ORAL_TABLET | Freq: Two times a day (BID) | ORAL | 0 refills | Status: DC

## 2015-08-31 MED ORDER — METRONIDAZOLE 500 MG PO TABS: 500.0000 mg | ORAL_TABLET | Freq: Two times a day (BID) | ORAL | 0 refills | Status: DC

## 2015-08-31 MED ORDER — HYDROCODONE-ACETAMINOPHEN 5-325 MG PO TABS: 1.0000 | ORAL_TABLET | Freq: Four times a day (QID) | ORAL | 0 refills | Status: DC | PRN

## 2015-08-31 MED ORDER — HYDROCODONE-ACETAMINOPHEN 5-325 MG PO TABS: 1.0000 | ORAL_TABLET | Freq: Once | ORAL | Status: DC

## 2015-08-31 MED ORDER — ONDANSETRON HCL 4 MG PO TABS: 4.0000 mg | ORAL_TABLET | Freq: Three times a day (TID) | ORAL | 0 refills | Status: DC | PRN

## 2015-08-31 MED ORDER — ONDANSETRON HCL 4 MG PO TABS
4.0000 mg | ORAL_TABLET | Freq: Once | ORAL | Status: AC
  Administered 2015-08-31: 4 mg via ORAL
  Filled 2015-08-31: qty 1

## 2015-08-31 NOTE — ED Triage Notes (Signed)
Pt c/o left side abd pain after she eats with vomiting. Pt states she had near syncopal episode in shower tonight.

## 2015-08-31 NOTE — Discharge Instructions (Signed)
You were seen today for abdominal pain. The cause of her pain at this time is unclear. However, your workup is reassuring. No indication at this time for CT imaging. Given your history of diverticulitis and location of pain, he will be given antibiotics. If the pain worsens or you have any new or worsening symptoms she needs to be reevaluated.

## 2015-08-31 NOTE — ED Provider Notes (Signed)
[facility] DEPT Provider Note   CSN: [id number] Arrival date & time: 08/31/15  0136  First Provider Contact:  None       History   Chief Complaint Chief Complaint  Patient presents with  . Abdominal Pain    HPI Angelica Kim is a 25 y.o. female.  HPI  This is a 25 year old female with history of kidney stones, migraines, fibromyalgia who presents with left mid abdominal pain. Patient reports waxing and waning pain over the last week. She reports that is in her left mid abdomen, crampy in nature. Currently 6 out of 10. She's not taken anything for pain. It radiates to her back. It is worse with food intake. She reports vomiting. No diarrhea but "loose stools". Denies constipation. She has a history of kidney stones with similar pain; however, she states "this is different and my back pain is less." She denies fever, dysuria, hematuria.  She has an IUD. Reports that she took "5 home pregnancy tests that were all negative."  Past Medical History:  Diagnosis Date  . Ankylosing spondylitis (HCC)   . Asthma   . Fibromyalgia   . Kidney stones   . Migraine     Patient Active Problem List   Diagnosis Date Noted  . Generalized anxiety disorder 04/07/2014  . Migraines 12/21/2013  . Low back pain 12/21/2013  . Cognitive changes 12/21/2013    Past Surgical History:  Procedure Laterality Date  . COLONOSCOPY    . ESOPHAGOGASTRODUODENOSCOPY ENDOSCOPY    . spinal tap    . WISDOM TOOTH EXTRACTION      OB History    No data available       Home Medications    Prior to Admission medications   Medication Sig Start Date End Date Taking? Authorizing Provider  albuterol (PROVENTIL HFA;VENTOLIN HFA) 108 (90 BASE) MCG/ACT inhaler Inhale 2 puffs into the lungs every 6 (six) hours as needed for wheezing or shortness of breath.   Yes Historical Provider, MD  Cyanocobalamin (VITAMIN B12 PO) Take 1 tablet by mouth daily.    Yes Historical Provider, MD  etonogestrel (NEXPLANON) 68 MG  IMPL implant 1 each by Subdermal route once.   Yes Historical Provider, MD  fluticasone (FLOVENT DISKUS) 50 MCG/BLIST diskus inhaler Inhale 1 puff into the lungs 2 (two) times daily.   Yes Historical Provider, MD  Mometasone Furoate (NASONEX NA) Place into the nose 2 (two) times daily.   Yes Historical Provider, MD  ciprofloxacin (CIPRO) 500 MG tablet Take 1 tablet (500 mg total) by mouth 2 (two) times daily. 08/31/15   Shon Baton, MD  clonazePAM (KLONOPIN) 0.5 MG tablet Take 1 tablet (0.5 mg total) by mouth daily as needed for anxiety. 04/07/14 04/07/15  Myrlene Broker, MD  escitalopram (LEXAPRO) 10 MG tablet Take 1 tablet (10 mg total) by mouth daily. 04/07/14 04/07/15  Myrlene Broker, MD  HYDROcodone-acetaminophen (NORCO/VICODIN) 5-325 MG tablet Take 1 tablet by mouth every 6 (six) hours as needed. 08/31/15   Shon Baton, MD  metroNIDAZOLE (FLAGYL) 500 MG tablet Take 1 tablet (500 mg total) by mouth 2 (two) times daily. 08/31/15   Shon Baton, MD  ondansetron (ZOFRAN) 4 MG tablet Take 1 tablet (4 mg total) by mouth every 8 (eight) hours as needed for nausea or vomiting. 08/31/15   Shon Baton, MD  pregabalin (LYRICA) 50 MG capsule Take 1 capsule (50 mg total) by mouth 2 (two) times daily. 03/23/14   Anson Fret, MD  SUMAtriptan (IMITREX) 100 MG tablet May repeat in 2 hours if headache persists or recurs. 02/16/15   Anson Fret, MD    Family History Family History  Problem Relation Age of Onset  . Arthritis Mother   . Hypertension Maternal Grandmother   . Thyroid disease Maternal Grandmother   . Kidney Stones Maternal Grandfather     Social History Social History  Substance Use Topics  . Smoking status: Never Smoker  . Smokeless tobacco: Never Used  . Alcohol use 0.0 oz/week     Comment: occassionally wine  every other weekend     Allergies   Amoxicillin and Zofran [ondansetron hcl]   Review of Systems Review of Systems  Constitutional: Negative for fever.    Respiratory: Negative for shortness of breath.   Cardiovascular: Negative for chest pain.  Gastrointestinal: Positive for abdominal pain, nausea and vomiting. Negative for constipation and diarrhea.  Genitourinary: Negative for dysuria, hematuria and vaginal discharge.  All other systems reviewed and are negative.    Physical Exam Updated Vital Signs BP 118/85 (BP Location: Right Arm)   Pulse 76   Temp 98.2 F (36.8 C) (Oral)   Resp 16   Ht 5' (1.524 m)   Wt 164 lb (74.4 kg)   SpO2 99%   BMI 32.03 kg/m   Physical Exam  Constitutional: She is oriented to person, place, and time. She appears well-developed and well-nourished. No distress.  HENT:  Head: Normocephalic and atraumatic.  Cardiovascular: Normal rate, regular rhythm and normal heart sounds.   No murmur heard. Pulmonary/Chest: Effort normal and breath sounds normal. No respiratory distress. She has no wheezes.  Abdominal: Soft. Bowel sounds are normal. She exhibits no mass. There is no tenderness. There is no guarding.  Neurological: She is alert and oriented to person, place, and time.  Skin: Skin is warm and dry.  Psychiatric: She has a normal mood and affect.  Nursing note and vitals reviewed.    ED Treatments / Results  Labs (all labs ordered are listed, but only abnormal results are displayed) Labs Reviewed  URINALYSIS, ROUTINE W REFLEX MICROSCOPIC (NOT AT [facility]) - Abnormal; Notable for the following:       Result Value   Specific Gravity, Urine >1.030 (*)    All other components within normal limits  CBC WITH DIFFERENTIAL/PLATELET - Abnormal; Notable for the following:    WBC 12.5 (*)    Lymphs Abs 5.2 (*)    All other components within normal limits  COMPREHENSIVE METABOLIC PANEL - Abnormal; Notable for the following:    Potassium 3.3 (*)    Glucose, Bld 105 (*)    Total Bilirubin 0.2 (*)    Anion gap 3 (*)    All other components within normal limits  PREGNANCY, URINE  LIPASE, BLOOD    EKG   EKG Interpretation  Date/Time:  Wednesday August 31 2015 01:56:45 EDT Ventricular Rate:  94 PR Interval:    QRS Duration: 85 QT Interval:  356 QTC Calculation: 446 R Axis:   53 Text Interpretation:  Sinus rhythm Borderline T wave abnormalities No significant change since last tracing Confirmed by Bowman Higbie  MD, Mariya Mottley ([id number]) on 08/31/2015 2:04:24 AM       Radiology Dg Abdomen 1 View  Result Date: 08/31/2015 CLINICAL DATA:  25 year old female with left upper quadrant abdominal pain and nausea EXAM: ABDOMEN - 1 VIEW COMPARISON:  Abdominal CT dated 01/03/2014 FINDINGS: There is moderate stool in the colon. No bowel dilatation or evidence of obstruction.  No free air. No radiopaque calculi. An intrauterine device is noted. The osseous structures and soft tissues appear unremarkable. IMPRESSION: Negative. Electronically Signed   By: Elgie Collard M.D.   On: 08/31/2015 03:55    Procedures Procedures (including critical care time)  Medications Ordered in ED Medications  HYDROcodone-acetaminophen (NORCO/VICODIN) 5-325 MG per tablet 1 tablet (1 tablet Oral Not Given 08/31/15 0213)  ondansetron (ZOFRAN) tablet 4 mg (4 mg Oral Given 08/31/15 0329)     Initial Impression / Assessment and Plan / ED Course  I have reviewed the triage vital signs and the nursing notes.  Pertinent labs & imaging results that were available during my care of the patient were reviewed by me and considered in my medical decision making (see chart for details).  Clinical Course    Patient presents with left-sided abdominal pain. Ongoing and intermittent over the last week. She is nontoxic. Vital signs reassuring. Abdominal exam is relatively benign. Considerations include kidney stone, colitis, enteritis, less likely obstruction as patient has no significant cervical history and is low risk. She is also passing stools. Patient was given pain and nausea medication. Lab work is largely reassuring. She does have a mild  leukocytosis. On recheck, she reports persistent nausea but pain improved. She now reports history of diverticulitis in the past. Abdominal exam continues to be benign. Given this history and mild leukocytosis with location of pain, will treat for diverticulitis. I do not feel she needs additional imaging at this time. She will be discharged with antibiotics, Zofran, and a short course of pain medication. She is able to tolerate fluids prior to discharge.  Final Clinical Impressions(s) / ED Diagnoses   Final diagnoses:  Left sided abdominal pain    New Prescriptions New Prescriptions   CIPROFLOXACIN (CIPRO) 500 MG TABLET    Take 1 tablet (500 mg total) by mouth 2 (two) times daily.   HYDROCODONE-ACETAMINOPHEN (NORCO/VICODIN) 5-325 MG TABLET    Take 1 tablet by mouth every 6 (six) hours as needed.   METRONIDAZOLE (FLAGYL) 500 MG TABLET    Take 1 tablet (500 mg total) by mouth 2 (two) times daily.   ONDANSETRON (ZOFRAN) 4 MG TABLET    Take 1 tablet (4 mg total) by mouth every 8 (eight) hours as needed for nausea or vomiting.     Shon Baton, MD 08/31/15 [phone]

## 2015-08-31 NOTE — ED Notes (Signed)
Unable to urinate at this time.  

## 2016-04-10 ENCOUNTER — Emergency Department (HOSPITAL_COMMUNITY)
Admission: EM | Admit: 2016-04-10 | Discharge: 2016-04-10 | Disposition: A | Discharge status: Home / Self Care | Payer: BC Managed Care – PPO | Attending: Emergency Medicine | Admitting: Emergency Medicine

## 2016-04-10 ENCOUNTER — Emergency Department (HOSPITAL_COMMUNITY): Payer: BC Managed Care – PPO

## 2016-04-10 ENCOUNTER — Encounter (HOSPITAL_COMMUNITY): Payer: Self-pay | Admitting: Emergency Medicine

## 2016-04-10 DIAGNOSIS — R1031 Right lower quadrant pain: Secondary | ICD-10-CM | POA: Insufficient documentation

## 2016-04-10 DIAGNOSIS — R1032 Left lower quadrant pain: Secondary | ICD-10-CM | POA: Diagnosis not present

## 2016-04-10 DIAGNOSIS — J45909 Unspecified asthma, uncomplicated: Secondary | ICD-10-CM | POA: Diagnosis not present

## 2016-04-10 DIAGNOSIS — Z79899 Other long term (current) drug therapy: Secondary | ICD-10-CM | POA: Diagnosis not present

## 2016-04-10 DIAGNOSIS — R1010 Upper abdominal pain, unspecified: Secondary | ICD-10-CM

## 2016-04-10 HISTORY — DX: Unspecified ovarian cyst, unspecified side: N83.209

## 2016-04-10 LAB — URINALYSIS, ROUTINE W REFLEX MICROSCOPIC
BILIRUBIN URINE: NEGATIVE
Glucose, UA: NEGATIVE mg/dL
HGB URINE DIPSTICK: NEGATIVE
Ketones, ur: NEGATIVE mg/dL
NITRITE: NEGATIVE
PROTEIN: NEGATIVE mg/dL
Specific Gravity, Urine: 1.027 (ref 1.005–1.030)
pH: 5 (ref 5.0–8.0)

## 2016-04-10 LAB — COMPREHENSIVE METABOLIC PANEL
ALK PHOS: 85 U/L (ref 38–126)
ALT: 15 U/L (ref 14–54)
ANION GAP: 6 (ref 5–15)
AST: 14 U/L — ABNORMAL LOW (ref 15–41)
Albumin: 4.2 g/dL (ref 3.5–5.0)
BILIRUBIN TOTAL: 0.4 mg/dL (ref 0.3–1.2)
BUN: 17 mg/dL (ref 6–20)
CALCIUM: 9.4 mg/dL (ref 8.9–10.3)
CO2: 26 mmol/L (ref 22–32)
CREATININE: 0.82 mg/dL (ref 0.44–1.00)
Chloride: 104 mmol/L (ref 101–111)
Glucose, Bld: 91 mg/dL (ref 65–99)
Potassium: 4.2 mmol/L (ref 3.5–5.1)
Sodium: 136 mmol/L (ref 135–145)
TOTAL PROTEIN: 7.4 g/dL (ref 6.5–8.1)

## 2016-04-10 LAB — CBC
HCT: 44.8 % (ref 36.0–46.0)
Hemoglobin: 15 g/dL (ref 12.0–15.0)
MCH: 29.3 pg (ref 26.0–34.0)
MCHC: 33.5 g/dL (ref 30.0–36.0)
MCV: 87.5 fL (ref 78.0–100.0)
PLATELETS: 264 10*3/uL (ref 150–400)
RBC: 5.12 MIL/uL — AB (ref 3.87–5.11)
RDW: 12.6 % (ref 11.5–15.5)
WBC: 13.5 10*3/uL — AB (ref 4.0–10.5)

## 2016-04-10 LAB — LIPASE, BLOOD: Lipase: 44 U/L (ref 11–51)

## 2016-04-10 LAB — PREGNANCY, URINE: Preg Test, Ur: NEGATIVE

## 2016-04-10 MED ORDER — TRAMADOL HCL 50 MG PO TABS: 50.0000 mg | ORAL_TABLET | Freq: Four times a day (QID) | ORAL | 0 refills | Status: DC | PRN

## 2016-04-10 NOTE — Discharge Instructions (Signed)
Follow up with your ob-gyn md °

## 2016-04-10 NOTE — ED Provider Notes (Signed)
[facility] Provider Note   CSN: [id number] Arrival date & time: 04/10/16  1506  By signing my name below, I, Elder Negus, attest that this documentation has been prepared under the direction and in the presence of Bethann Berkshire, MD. Electronically Signed: Elder Negus, Scribe. 04/10/16. 9:21 PM.   History   Chief Complaint Chief Complaint  Patient presents with  . Abdominal Pain    HPI Angelica Kim is a 26 y.o. female with history of nephrolithiasis and ovarian cysts who presents to the ED for evaluation of abdominal pain. This patient states that for the last 4 days she has experienced intermittent pain in the bilateral lower abdomen and R flank area. Today her symptoms became constant and presents to this facility. She denies any associated symptoms including fevers, chills, vomiting. No cough or congestion. No urinary complaints.   The history is provided by the patient. No language interpreter was used.  Abdominal Pain   This is a new problem. The current episode started more than 2 days ago. The problem occurs constantly. The problem has not changed since onset.The pain is associated with an unknown factor. The pain is located in the RLQ and LLQ (R flank). The quality of the pain is sharp. The pain is moderate. Pertinent negatives include fever, diarrhea, vomiting, frequency, hematuria and headaches. Nothing aggravates the symptoms. Nothing relieves the symptoms. Past medical history comments: nephrolithiasis.    Past Medical History:  Diagnosis Date  . Ankylosing spondylitis (HCC)   . Asthma   . Fibromyalgia   . Kidney stones   . Migraine   . Ovarian cyst     Patient Active Problem List   Diagnosis Date Noted  . Generalized anxiety disorder 04/07/2014  . Migraines 12/21/2013  . Low back pain 12/21/2013  . Cognitive changes 12/21/2013    Past Surgical History:  Procedure Laterality Date  . COLONOSCOPY    . ESOPHAGOGASTRODUODENOSCOPY ENDOSCOPY    .  spinal tap    . WISDOM TOOTH EXTRACTION      OB History    Gravida Para Term Preterm AB Living             0   SAB TAB Ectopic Multiple Live Births                   Home Medications    Prior to Admission medications   Medication Sig Start Date End Date Taking? Authorizing Provider  albuterol (PROVENTIL HFA;VENTOLIN HFA) 108 (90 BASE) MCG/ACT inhaler Inhale 2 puffs into the lungs every 6 (six) hours as needed for wheezing or shortness of breath.   Yes Historical Provider, MD  fluticasone (FLOVENT DISKUS) 50 MCG/BLIST diskus inhaler Inhale 1 puff into the lungs 2 (two) times daily.   Yes Historical Provider, MD  Levonorgestrel (SKYLA) 13.5 MG IUD by Intrauterine route once.   Yes Historical Provider, MD  mometasone (NASONEX) 50 MCG/ACT nasal spray Place 2 sprays into the nose daily.   Yes Historical Provider, MD  SUMAtriptan (IMITREX) 100 MG tablet May repeat in 2 hours if headache persists or recurs. 02/16/15  Yes Anson Fret, MD    Family History Family History  Problem Relation Age of Onset  . Arthritis Mother   . Hypertension Maternal Grandmother   . Thyroid disease Maternal Grandmother   . Kidney Stones Maternal Grandfather     Social History Social History  Substance Use Topics  . Smoking status: Never Smoker  . Smokeless tobacco: Never Used  . Alcohol use  0.0 oz/week     Comment: occassionally wine  every other weekend     Allergies   Amoxicillin and Zofran [ondansetron hcl]   Review of Systems Review of Systems  Constitutional: Negative for appetite change, fatigue and fever.  HENT: Negative for congestion, ear discharge and sinus pressure.   Eyes: Negative for discharge.  Respiratory: Negative for cough.   Cardiovascular: Negative for chest pain.  Gastrointestinal: Negative for abdominal pain, diarrhea and vomiting.  Genitourinary: Negative for frequency and hematuria.  Musculoskeletal: Negative for back pain.  Skin: Negative for rash.    Neurological: Negative for seizures and headaches.  Psychiatric/Behavioral: Negative for hallucinations.     Physical Exam Updated Vital Signs BP 119/68 (BP Location: Left Arm)   Pulse 100   Temp 99.2 F (37.3 C) (Oral)   Resp 16   Ht 5' (1.524 m)   Wt 162 lb (73.5 kg)   LMP 04/08/2016   SpO2 100%   BMI 31.64 kg/m   Physical Exam  Constitutional: She is oriented to person, place, and time. She appears well-developed.  HENT:  Head: Normocephalic.  Eyes: Conjunctivae and EOM are normal. No scleral icterus.  Neck: Neck supple. No thyromegaly present.  Cardiovascular: Normal rate and regular rhythm.  Exam reveals no gallop and no friction rub.   No murmur heard. Pulmonary/Chest: No stridor. She has no wheezes. She has no rales. She exhibits no tenderness.  Abdominal: She exhibits no distension. There is no rebound.  RLQ and LLQ tenderness.   Musculoskeletal: Normal range of motion. She exhibits no edema.  R flank tenderness.   Lymphadenopathy:    She has no cervical adenopathy.  Neurological: She is oriented to person, place, and time. She exhibits normal muscle tone. Coordination normal.  Skin: No rash noted. No erythema.  Psychiatric: She has a normal mood and affect. Her behavior is normal.     ED Treatments / Results  DIAGNOSTIC STUDIES: Oxygen Saturation is 100 percent on room air which is normal by my interpretation.    COORDINATION OF CARE: 9:19 PM Discussed treatment plan with pt at bedside and pt agreed to plan.  Labs (all labs ordered are listed, but only abnormal results are displayed) Labs Reviewed  COMPREHENSIVE METABOLIC PANEL - Abnormal; Notable for the following:       Result Value   AST 14 (*)    All other components within normal limits  CBC - Abnormal; Notable for the following:    WBC 13.5 (*)    RBC 5.12 (*)    All other components within normal limits  URINALYSIS, ROUTINE W REFLEX MICROSCOPIC - Abnormal; Notable for the following:     Leukocytes, UA TRACE (*)    Bacteria, UA RARE (*)    All other components within normal limits  LIPASE, BLOOD  PREGNANCY, URINE    EKG  EKG Interpretation None       Radiology No results found.  Procedures Procedures (including critical care time)  Medications Ordered in ED Medications - No data to display   Initial Impression / Assessment and Plan / ED Course  I have reviewed the triage vital signs and the nursing notes.  Pertinent labs & imaging results that were available during my care of the patient were reviewed by me and considered in my medical decision making (see chart for details).     Patient with lower abdominal pain and back pain labs unremarkable CT scan unremarkable patient is referred to her OB/GYN for follow-up and given  Ultram  Final Clinical Impressions(s) / ED Diagnoses   Final diagnoses:  None    New Prescriptions New Prescriptions   No medications on file  The chart was scribed for me under my direct supervision.  I personally performed the history, physical, and medical decision making and all procedures in the evaluation of this patient.Bethann Berkshire.    Avi Archuleta, MD 04/10/16 2219

## 2016-04-10 NOTE — ED Triage Notes (Signed)
Patient c/o right lower abd pain that started on Thursday. Per patient was intermittent but has now become constant. Patient denies any vomiting, fever, or urinary symptoms. Per patient nausea from pain. Patient also staets right side back pain that radiates to middle of back.

## 2016-10-17 ENCOUNTER — Emergency Department (HOSPITAL_COMMUNITY): Payer: BC Managed Care – PPO

## 2016-10-17 ENCOUNTER — Encounter (HOSPITAL_COMMUNITY): Payer: Self-pay | Admitting: Emergency Medicine

## 2016-10-17 ENCOUNTER — Emergency Department (HOSPITAL_COMMUNITY)
Admission: EM | Admit: 2016-10-17 | Discharge: 2016-10-17 | Disposition: A | Discharge status: Home / Self Care | Payer: BC Managed Care – PPO | Attending: Emergency Medicine | Admitting: Emergency Medicine

## 2016-10-17 DIAGNOSIS — R1032 Left lower quadrant pain: Secondary | ICD-10-CM

## 2016-10-17 DIAGNOSIS — Z8742 Personal history of other diseases of the female genital tract: Secondary | ICD-10-CM | POA: Insufficient documentation

## 2016-10-17 DIAGNOSIS — J45909 Unspecified asthma, uncomplicated: Secondary | ICD-10-CM | POA: Diagnosis not present

## 2016-10-17 DIAGNOSIS — N898 Other specified noninflammatory disorders of vagina: Secondary | ICD-10-CM | POA: Diagnosis not present

## 2016-10-17 DIAGNOSIS — Z79899 Other long term (current) drug therapy: Secondary | ICD-10-CM | POA: Insufficient documentation

## 2016-10-17 DIAGNOSIS — Z975 Presence of (intrauterine) contraceptive device: Secondary | ICD-10-CM | POA: Diagnosis not present

## 2016-10-17 DIAGNOSIS — R112 Nausea with vomiting, unspecified: Secondary | ICD-10-CM | POA: Insufficient documentation

## 2016-10-17 LAB — URINALYSIS, ROUTINE W REFLEX MICROSCOPIC
BILIRUBIN URINE: NEGATIVE
Glucose, UA: NEGATIVE mg/dL
KETONES UR: NEGATIVE mg/dL
Leukocytes, UA: NEGATIVE
Nitrite: NEGATIVE
Protein, ur: NEGATIVE mg/dL
SPECIFIC GRAVITY, URINE: 1.024 (ref 1.005–1.030)
pH: 5 (ref 5.0–8.0)

## 2016-10-17 LAB — COMPREHENSIVE METABOLIC PANEL
ALBUMIN: 4.4 g/dL (ref 3.5–5.0)
ALT: 13 U/L — ABNORMAL LOW (ref 14–54)
AST: 15 U/L (ref 15–41)
Alkaline Phosphatase: 91 U/L (ref 38–126)
Anion gap: 8 (ref 5–15)
BUN: 13 mg/dL (ref 6–20)
CO2: 24 mmol/L (ref 22–32)
Calcium: 9.2 mg/dL (ref 8.9–10.3)
Chloride: 106 mmol/L (ref 101–111)
Creatinine, Ser: 0.77 mg/dL (ref 0.44–1.00)
GFR calc Af Amer: >60 mL/min (ref >60)
GFR calc non Af Amer: >60 mL/min (ref >60)
GLUCOSE: 87 mg/dL (ref 65–99)
POTASSIUM: 3.9 mmol/L (ref 3.5–5.1)
Sodium: 138 mmol/L (ref 135–145)
Total Bilirubin: 1.1 mg/dL (ref 0.3–1.2)
Total Protein: 7.4 g/dL (ref 6.5–8.1)

## 2016-10-17 LAB — CBC WITH DIFFERENTIAL/PLATELET
Basophils Absolute: 0 10*3/uL (ref 0.0–0.1)
Basophils Relative: 0 %
Eosinophils Absolute: 0.1 10*3/uL (ref 0.0–0.7)
Eosinophils Relative: 1 %
HCT: 43.6 % (ref 36.0–46.0)
HEMOGLOBIN: 15.1 g/dL — AB (ref 12.0–15.0)
Lymphocytes Relative: 31 %
Lymphs Abs: 3.6 10*3/uL (ref 0.7–4.0)
MCH: 30.5 pg (ref 26.0–34.0)
MCHC: 34.6 g/dL (ref 30.0–36.0)
MCV: 88.1 fL (ref 78.0–100.0)
Monocytes Absolute: 0.6 10*3/uL (ref 0.1–1.0)
Monocytes Relative: 5 %
NEUTROS ABS: 7.2 10*3/uL (ref 1.7–7.7)
NEUTROS PCT: 63 %
Platelets: 237 10*3/uL (ref 150–400)
RBC: 4.95 MIL/uL (ref 3.87–5.11)
RDW: 12.9 % (ref 11.5–15.5)
WBC: 11.6 10*3/uL — AB (ref 4.0–10.5)

## 2016-10-17 LAB — WET PREP, GENITAL
CLUE CELLS WET PREP: NONE SEEN
SPERM: NONE SEEN
TRICH WET PREP: NONE SEEN
YEAST WET PREP: NONE SEEN

## 2016-10-17 LAB — PREGNANCY, URINE: Preg Test, Ur: NEGATIVE

## 2016-10-17 LAB — LIPASE, BLOOD: LIPASE: 36 U/L (ref 11–51)

## 2016-10-17 MED ORDER — SODIUM CHLORIDE 0.9 % IV BOLUS (SEPSIS)
1000.0000 mL | Freq: Once | INTRAVENOUS | Status: AC
  Administered 2016-10-17: 1000 mL via INTRAVENOUS

## 2016-10-17 MED ORDER — KETOROLAC TROMETHAMINE 30 MG/ML IJ SOLN
30.0000 mg | Freq: Once | INTRAMUSCULAR | Status: AC
  Administered 2016-10-17: 30 mg via INTRAVENOUS
  Filled 2016-10-17: qty 1

## 2016-10-17 MED ORDER — PROMETHAZINE HCL 25 MG PO TABS: 25.0000 mg | ORAL_TABLET | Freq: Four times a day (QID) | ORAL | 0 refills | Status: DC | PRN

## 2016-10-17 NOTE — Discharge Instructions (Signed)
Her ultrasound showed no signs of ovarian torsion or cyst. Unknown etiology of your symptoms. Would recommend continuing anti-inflammatories and Tylenol at home with cold and warm compressions. We'll give you some Phenergan to take for nausea and pain as needed. Make she follow up with her OB/GYN doctor and your primary care doctor. If he develops any fevers, chills, worsening pain, worsening vomiting, new pain, bloody stools or urinary symptoms return to the ED.

## 2016-10-17 NOTE — ED Provider Notes (Signed)
[facility]-EMERGENCY DEPT Provider Note   CSN: [id number] Arrival date & time: 10/17/16  0946     History   Chief Complaint Chief Complaint  Patient presents with  . Emesis  . Abdominal Pain    HPI Angelica Kim is a 26 y.o. female.  HPI   Angelica Kim is a 26 y.o. female, with a history of Ovarian cyst, ankylosing spondylitis, and fibromyalgia, presenting to the ED with abdominal pain for the last five days, worse over the last two days. Pain is waxing and waning, LLQ, described as a throbbing or pulsing (4-5/10) when at its best, sharp/stabbing (8/10) at its worst, radiating to the left lower back. Pain is worse with eating, laying down, or sitting. Improves with walking. States it initially felt like a ruptured ovarian cyst and treated it with ibuprofen and warm compresses. However, when the pain worsened and changed, it no longer feels like a cyst.  Endorses N/V for past two days. Last BM this morning and normal. Sexually active with one female partner.  Endorse vaginal spotting yesterday. Increased, clear discharge before this. Had two days of bleeding last month around August 21, which would have been on time, but was shorter than her normal 5 days. Patient was regular and had normal menses for previous four months. Patient has an IUD placed November 2017. She is followed by [facility]. She has not seen them for this issue. She has no current concerns for STD.  Denies diarrhea, fever/chills, urinary complaints, hematochezia/melena, dyspareunia, or any other complaints.      Past Medical History:  Diagnosis Date  . Ankylosing spondylitis (HCC)   . Asthma   . Fibromyalgia   . Kidney stones   . Migraine   . Ovarian cyst     Patient Active Problem List   Diagnosis Date Noted  . Generalized anxiety disorder 04/07/2014  . Migraines 12/21/2013  . Low back pain 12/21/2013  . Cognitive changes 12/21/2013    Past Surgical History:  Procedure Laterality Date  . COLONOSCOPY    .  ESOPHAGOGASTRODUODENOSCOPY ENDOSCOPY    . spinal tap    . WISDOM TOOTH EXTRACTION      OB History    Gravida Para Term Preterm AB Living             0   SAB TAB Ectopic Multiple Live Births                   Home Medications    Prior to Admission medications   Medication Sig Start Date End Date Taking? Authorizing Provider  albuterol (PROVENTIL HFA;VENTOLIN HFA) 108 (90 BASE) MCG/ACT inhaler Inhale 2 puffs into the lungs every 6 (six) hours as needed for wheezing or shortness of breath.   Yes [provider]  fluticasone (FLOVENT DISKUS) 50 MCG/BLIST diskus inhaler Inhale 2 puffs into the lungs 2 (two) times daily.    Yes [provider]  ibuprofen (ADVIL,MOTRIN) 200 MG tablet Take 800 mg by mouth every 6 (six) hours as needed for fever, headache, mild pain, moderate pain or cramping.   Yes [provider]  Levonorgestrel (SKYLA) 13.5 MG IUD 1 each by Intrauterine route once. Placed 07/2015   Yes [provider]  mometasone (NASONEX) 50 MCG/ACT nasal spray Place 2 sprays into the nose 2 (two) times daily.    Yes [provider]  SUMAtriptan (IMITREX) 100 MG tablet May repeat in 2 hours if headache persists or recurs. 02/16/15  Yes Anson Fret, MD  Family History Family History  Problem Relation Age of Onset  . Arthritis Mother   . Hypertension Maternal Grandmother   . Thyroid disease Maternal Grandmother   . Kidney Stones Maternal Grandfather     Social History Social History  Substance Use Topics  . Smoking status: Never Smoker  . Smokeless tobacco: Never Used  . Alcohol use 0.0 oz/week     Comment: occassionally wine  every other weekend     Allergies   Amoxicillin and Zofran [ondansetron hcl]   Review of Systems Review of Systems  Constitutional: Negative for chills, diaphoresis and fever.  Respiratory: Negative for shortness of breath.   Cardiovascular: Negative for chest pain.  Gastrointestinal: Positive for  abdominal pain, nausea and vomiting. Negative for blood in stool and diarrhea.  Genitourinary: Positive for flank pain, vaginal bleeding and vaginal discharge. Negative for dyspareunia and dysuria.  Musculoskeletal: Positive for back pain.  Neurological: Negative for weakness and numbness.  All other systems reviewed and are negative.    Physical Exam Updated Vital Signs BP 119/80 (BP Location: Left Arm)   Pulse 97   Temp 98.3 F (36.8 C) (Oral)   Resp 16   Ht  (1.651 m)   Wt 69.4 kg (153 lb)   SpO2 100%   BMI 25.46 kg/m   Physical Exam  Constitutional: She appears well-developed and well-nourished. No distress.  HENT:  Head: Normocephalic and atraumatic.  Eyes: Conjunctivae are normal.  Neck: Neck supple.  Cardiovascular: Normal rate, regular rhythm, normal heart sounds and intact distal pulses.   Pulmonary/Chest: Effort normal and breath sounds normal. No respiratory distress.  Abdominal: Soft. There is tenderness in the suprapubic area and left lower quadrant. There is no guarding.  Genitourinary:  Genitourinary Comments: External genitalia normal Vagina with discharge - bright red blood in the vaginal vault Cervix  Normal - IUD string noted from the cervical os negative for cervical motion tenderness Adnexa palpated, no masses, positive for tenderness noted on the left Bladder palpated positive for tenderness Uterus palpated no masses, positive for tenderness  No inguinal lymphadenopathy. Otherwise normal female genitalia. Med Tech, Fleet Contras, served as Biomedical engineer during exam.  Musculoskeletal: She exhibits no edema.  Lymphadenopathy:    She has no cervical adenopathy.  Neurological: She is alert.  Skin: Skin is warm and dry. She is not diaphoretic.  Psychiatric: She has a normal mood and affect. Her behavior is normal.  Nursing note and vitals reviewed.    ED Treatments / Results  Labs (all labs ordered are listed, but only abnormal results are  displayed) Labs Reviewed  WET PREP, GENITAL - Abnormal; Notable for the following:       Result Value   WBC, Wet Prep HPF POC FEW (*)    All other components within normal limits  COMPREHENSIVE METABOLIC PANEL - Abnormal; Notable for the following:    ALT 13 (*)    All other components within normal limits  URINALYSIS, ROUTINE W REFLEX MICROSCOPIC - Abnormal; Notable for the following:    Hgb urine dipstick LARGE (*)    Bacteria, UA RARE (*)    Squamous Epithelial / LPF 0-5 (*)    All other components within normal limits  CBC WITH DIFFERENTIAL/PLATELET - Abnormal; Notable for the following:    WBC 11.6 (*)    Hemoglobin 15.1 (*)    All other components within normal limits  LIPASE, BLOOD  PREGNANCY, URINE  RPR  HIV ANTIBODY (ROUTINE TESTING)  POC URINE PREG, ED  GC/CHLAMYDIA PROBE AMP ([place]) NOT AT [facility]    EKG  EKG Interpretation None       Radiology No results found.  Procedures Pelvic exam Date/Time: 10/17/2016 3:40 PM Performed by: Anselm Pancoast Authorized by: Harolyn Rutherford C  Consent: Verbal consent obtained. Risks and benefits: risks, benefits and alternatives were discussed Consent given by: patient Patient understanding: patient states understanding of the procedure being performed Patient consent: the patient's understanding of the procedure matches consent given Procedure consent: procedure consent matches procedure scheduled Patient identity confirmed: verbally with patient Local anesthesia used: no  Anesthesia: Local anesthesia used: no  Sedation: Patient sedated: no Patient tolerance: Patient tolerated the procedure well with no immediate complications    (including critical care time)  Medications Ordered in ED Medications  sodium chloride 0.9 % bolus 1,000 mL (0 mLs Intravenous Stopped 10/17/16 1647)  ketorolac (TORADOL) 30 MG/ML injection 30 mg (30 mg Intravenous Given 10/17/16 1451)     Initial Impression / Assessment and Plan / ED  Course  I have reviewed the triage vital signs and the nursing notes.  Pertinent labs & imaging results that were available during my care of the patient were reviewed by me and considered in my medical decision making (see chart for details).  Clinical Course as of Oct 18 1650  Wed Oct 17, 2016  1542 Patient states her pain is now minimal.  [SJ]    Clinical Course User Index [SJ] Anselm Pancoast, PA-C    Patient presents with lower left abdominal pain over the last several days, recently accompanied by nausea and vomiting. Left adnexal tenderness on exam.   End of shift patient care handoff report given to Azucena Kuba, PA-C. Plan: Pelvic ultrasound pending. If normal, reexamined patient and palpate her abdomen. CT scan may be necessary.    Vitals:   10/17/16 0950 10/17/16 1410  BP: 119/80 110/69  Pulse: 97 80  Resp: 16 15  Temp: 98.3 F (36.8 C)   TempSrc: Oral   SpO2: 100% 96%  Weight: 69.4 kg (153 lb)   Height:  (1.651 m)      Final Clinical Impressions(s) / ED Diagnoses   Final diagnoses:  None    New Prescriptions New Prescriptions   No medications on file     Concepcion Living 10/17/16 1652    Loren Racer, MD 10/20/16 [phone]

## 2016-10-17 NOTE — ED Notes (Signed)
ED Provider at bedside. 

## 2016-10-17 NOTE — ED Notes (Signed)
This RN had 1 unsuccessful attempt at collecting labs and starting an IV. Another RN has been notified and one will be placed shortly.

## 2016-10-17 NOTE — ED Triage Notes (Signed)
Pt presents with left left abdominal pain radiating to left flank x2 days with light vaginal discharge and bleeding. Vomiting x2 days without diarrhea. Hx of ovarian cyst rupture. Pt has IUD.

## 2016-10-17 NOTE — ED Notes (Signed)
Patient transported to Ultrasound 

## 2016-10-17 NOTE — ED Provider Notes (Signed)
Care assumed from previous provider PA Joy. Please see their note for further details to include full history and physical. To summarize in short pt is a  26 yo female with llq pelvi pain with associated emesis. Denies any fever, change in bowel habits, urinary symptoms, vaginal symptoms. States she has a history of cyst on her ovary and this feels similar.. Case discussed, plan agreed upon. At shift change was awaiting ultrasound rule out torsion or ruptured cyst. This returned was unremarkable. Patient was reevaluated and states her pain is improved however during the ultrasound that were pressing on her belly which caused her pain. Discussed options of obtaining CT scan. Shared decision making with patient concerning further imaging including CT of this time. Patient would like to hold off on the CT scan of her abdomen at this time. States she will go home and continue her NSAIDs, and rest. Encouraged to follow up with OB/GYN. Patient was reevaluated. No signs of surgical abdomen or distention. Patient's vital signs remained reassuring. Labs revealed a mild leukocytosis unsure of etiology.   Pt is hemodynamically stable, in NAD, & able to ambulate in the ED. Evaluation does not show pathology that would require ongoing emergent intervention or inpatient treatment. I explained the diagnosis to the patient. Pain has been managed & has no complaints prior to dc. Pt is comfortable with above plan and is stable for discharge at this time. All questions were answered prior to disposition. Strict return precautions for f/u to the ED were discussed. Encouraged follow up with PCP.      Rise Mu, PA-C 10/17/16 1827    Loren Racer, MD 10/29/16 [phone]

## 2016-10-17 NOTE — ED Notes (Signed)
Bed: [facility]02 Expected date:  Expected time:  Means of arrival:  Comments: 

## 2016-10-18 LAB — GC/CHLAMYDIA PROBE AMP (~~LOC~~) NOT AT ARMC
Chlamydia: NEGATIVE
Neisseria Gonorrhea: NEGATIVE

## 2016-10-18 LAB — RPR: RPR Ser Ql: NONREACTIVE

## 2016-10-18 LAB — HIV ANTIBODY (ROUTINE TESTING W REFLEX): HIV Screen 4th Generation wRfx: NONREACTIVE

## 2017-03-10 ENCOUNTER — Emergency Department (HOSPITAL_COMMUNITY)
Admission: EM | Admit: 2017-03-10 | Discharge: 2017-03-10 | Disposition: A | Discharge status: Home / Self Care | Payer: BC Managed Care – PPO | Attending: Emergency Medicine | Admitting: Emergency Medicine

## 2017-03-10 ENCOUNTER — Encounter (HOSPITAL_COMMUNITY): Payer: Self-pay | Admitting: Emergency Medicine

## 2017-03-10 ENCOUNTER — Emergency Department (HOSPITAL_COMMUNITY): Payer: BC Managed Care – PPO

## 2017-03-10 DIAGNOSIS — N201 Calculus of ureter: Secondary | ICD-10-CM | POA: Insufficient documentation

## 2017-03-10 DIAGNOSIS — N132 Hydronephrosis with renal and ureteral calculous obstruction: Secondary | ICD-10-CM | POA: Diagnosis not present

## 2017-03-10 DIAGNOSIS — R1032 Left lower quadrant pain: Secondary | ICD-10-CM | POA: Diagnosis present

## 2017-03-10 DIAGNOSIS — N390 Urinary tract infection, site not specified: Secondary | ICD-10-CM | POA: Diagnosis not present

## 2017-03-10 DIAGNOSIS — R319 Hematuria, unspecified: Secondary | ICD-10-CM | POA: Insufficient documentation

## 2017-03-10 DIAGNOSIS — J45909 Unspecified asthma, uncomplicated: Secondary | ICD-10-CM | POA: Diagnosis not present

## 2017-03-10 LAB — BASIC METABOLIC PANEL
ANION GAP: 11 (ref 5–15)
BUN: 12 mg/dL (ref 6–20)
CHLORIDE: 107 mmol/L (ref 101–111)
CO2: 23 mmol/L (ref 22–32)
Calcium: 9.5 mg/dL (ref 8.9–10.3)
Creatinine, Ser: 0.86 mg/dL (ref 0.44–1.00)
GFR calc Af Amer: >60 mL/min (ref >60)
GLUCOSE: 104 mg/dL — AB (ref 65–99)
POTASSIUM: 3.7 mmol/L (ref 3.5–5.1)
Sodium: 141 mmol/L (ref 135–145)

## 2017-03-10 LAB — URINALYSIS, ROUTINE W REFLEX MICROSCOPIC
Bilirubin Urine: NEGATIVE
Glucose, UA: NEGATIVE mg/dL
Ketones, ur: NEGATIVE mg/dL
NITRITE: NEGATIVE
PH: 6 (ref 5.0–8.0)
Protein, ur: 30 mg/dL — AB
SPECIFIC GRAVITY, URINE: 1.025 (ref 1.005–1.030)

## 2017-03-10 LAB — PREGNANCY, URINE: PREG TEST UR: NEGATIVE

## 2017-03-10 LAB — CBC
HEMATOCRIT: 42.6 % (ref 36.0–46.0)
Hemoglobin: 14.2 g/dL (ref 12.0–15.0)
MCH: 29.5 pg (ref 26.0–34.0)
MCHC: 33.3 g/dL (ref 30.0–36.0)
MCV: 88.4 fL (ref 78.0–100.0)
Platelets: 303 10*3/uL (ref 150–400)
RBC: 4.82 MIL/uL (ref 3.87–5.11)
RDW: 12.4 % (ref 11.5–15.5)
WBC: 9.6 10*3/uL (ref 4.0–10.5)

## 2017-03-10 LAB — LIPASE, BLOOD: Lipase: 40 U/L (ref 11–51)

## 2017-03-10 MED ORDER — METOCLOPRAMIDE HCL 5 MG/ML IJ SOLN
10.0000 mg | Freq: Once | INTRAMUSCULAR | Status: AC
  Administered 2017-03-10: 10 mg via INTRAVENOUS
  Filled 2017-03-10: qty 2

## 2017-03-10 MED ORDER — CIPROFLOXACIN HCL 500 MG PO TABS: 500.0000 mg | ORAL_TABLET | Freq: Two times a day (BID) | ORAL | 0 refills | Status: DC

## 2017-03-10 MED ORDER — PROMETHAZINE HCL 25 MG PO TABS: 25.0000 mg | ORAL_TABLET | Freq: Four times a day (QID) | ORAL | 0 refills | Status: DC | PRN

## 2017-03-10 MED ORDER — TAMSULOSIN HCL 0.4 MG PO CAPS: ORAL_CAPSULE | ORAL | 0 refills | Status: DC

## 2017-03-10 MED ORDER — DIPHENHYDRAMINE HCL 50 MG/ML IJ SOLN
25.0000 mg | Freq: Once | INTRAMUSCULAR | Status: AC
  Administered 2017-03-10: 25 mg via INTRAVENOUS
  Filled 2017-03-10: qty 1

## 2017-03-10 MED ORDER — CIPROFLOXACIN HCL 250 MG PO TABS
500.0000 mg | ORAL_TABLET | Freq: Once | ORAL | Status: AC
  Administered 2017-03-10: 500 mg via ORAL
  Filled 2017-03-10: qty 2

## 2017-03-10 MED ORDER — OXYCODONE-ACETAMINOPHEN 5-325 MG PO TABS: 1.0000 | ORAL_TABLET | Freq: Four times a day (QID) | ORAL | 0 refills | Status: DC | PRN

## 2017-03-10 MED ORDER — KETOROLAC TROMETHAMINE 30 MG/ML IJ SOLN
30.0000 mg | Freq: Once | INTRAMUSCULAR | Status: AC
  Administered 2017-03-10: 30 mg via INTRAVENOUS
  Filled 2017-03-10: qty 1

## 2017-03-10 MED ORDER — FENTANYL CITRATE (PF) 100 MCG/2ML IJ SOLN
50.0000 ug | Freq: Once | INTRAMUSCULAR | Status: DC
  Filled 2017-03-10: qty 2

## 2017-03-10 NOTE — ED Triage Notes (Signed)
Pt with c/o L. Flank pain since midnight. Pt states she has had kidney stones in past.

## 2017-03-10 NOTE — Discharge Instructions (Signed)
Drink plenty of fluids. Take the medications as prescribed. Return to the ED if you get fever or have uncontrolled vomiting or pain. Call [facility] to get a follow up appointment. You have "3 stones in the proximal ureter measuring 2 mm, 4 mm and 2 mm" . Try to stop the Pepsi's!!!

## 2017-03-10 NOTE — ED Notes (Addendum)
Pt reports left sided flank pain that radiates around to her llq since midnight with n/v.

## 2017-03-10 NOTE — ED Provider Notes (Signed)
[facility] EMERGENCY DEPARTMENT Provider Note   CSN: [id number] Arrival date & time: 03/10/17  0409  Time seen 05:00 AM  History   Chief Complaint Chief Complaint  Patient presents with  . Flank Pain    HPI Angelica Kim is a 27 y.o. female.  HPI patient reports a history of kidney stones and states she normally is able to pass them spontaneously without urological intervention.  She states about midnight she started having left flank pain which is now going down towards her left lower quadrant.  She has had nausea and vomiting about 6-7 times.  She denies any diarrhea.  She describes the pain as sharp and states it is constant but it waxes and wanes.  She states touching the area and movement makes the pain worse, heat helped a little bit.  She denies hematuria, dysuria although she states she feels like it is hard to urinate.  Patient has a family history of renal stones.  Patient also states she drinks 3-4 Pepsi's a day.  PCP Benita Stabile, MD   Past Medical History:  Diagnosis Date  . Ankylosing spondylitis (HCC)   . Asthma   . Fibromyalgia   . Kidney stones   . Migraine   . Ovarian cyst     Patient Active Problem List   Diagnosis Date Noted  . Generalized anxiety disorder 04/07/2014  . Migraines 12/21/2013  . Low back pain 12/21/2013  . Cognitive changes 12/21/2013    Past Surgical History:  Procedure Laterality Date  . COLONOSCOPY    . ESOPHAGOGASTRODUODENOSCOPY ENDOSCOPY    . spinal tap    . WISDOM TOOTH EXTRACTION      OB History    Gravida Para Term Preterm AB Living             0   SAB TAB Ectopic Multiple Live Births                   Home Medications    Prior to Admission medications   Medication Sig Start Date End Date Taking? Authorizing Provider  albuterol (PROVENTIL HFA;VENTOLIN HFA) 108 (90 BASE) MCG/ACT inhaler Inhale 2 puffs into the lungs every 6 (six) hours as needed for wheezing or shortness of breath.   Yes [provider]    fluticasone (FLOVENT DISKUS) 50 MCG/BLIST diskus inhaler Inhale 2 puffs into the lungs 2 (two) times daily.    Yes [provider]  mometasone (NASONEX) 50 MCG/ACT nasal spray Place 2 sprays into the nose 2 (two) times daily.    Yes [provider]  ciprofloxacin (CIPRO) 500 MG tablet Take 1 tablet (500 mg total) by mouth 2 (two) times daily. 03/10/17   Devoria Albe, MD  ibuprofen (ADVIL,MOTRIN) 200 MG tablet Take 800 mg by mouth every 6 (six) hours as needed for fever, headache, mild pain, moderate pain or cramping.    [provider]  Levonorgestrel (SKYLA) 13.5 MG IUD 1 each by Intrauterine route once. Placed 07/2015    [provider]  oxyCODONE-acetaminophen (PERCOCET/ROXICET) 5-325 MG tablet Take 1 tablet by mouth every 6 (six) hours as needed for severe pain. 03/10/17   Devoria Albe, MD  promethazine (PHENERGAN) 25 MG tablet Take 1 tablet (25 mg total) by mouth every 6 (six) hours as needed for nausea or vomiting. 03/10/17   Devoria Albe, MD  SUMAtriptan (IMITREX) 100 MG tablet May repeat in 2 hours if headache persists or recurs. 02/16/15   Anson Fret, MD  tamsulosin (  FLOMAX) 0.4 MG CAPS capsule Take 1 po QD until you pass the stone. 03/10/17   [name], Lyndall Bellot, MD    Family History Family History  Problem Relation Age of Onset  . Arthritis Mother   . Hypertension Maternal Grandmother   . Thyroid disease Maternal Grandmother   . Kidney Stones Maternal Grandfather     Social History Social History   Tobacco Use  . Smoking status: Never Smoker  . Smokeless tobacco: Never Used  Substance Use Topics  . Alcohol use: Yes    Alcohol/week: 0.0 oz    Comment: occassionally wine  every other weekend  . Drug use: No  employed   Allergies   Amoxicillin and Zofran [ondansetron hcl]   Review of Systems Review of Systems  All other systems reviewed and are negative.    Physical Exam Updated Vital Signs BP 126/83 (BP Location: Right Arm)   Pulse 85    Temp 97.8 F (36.6 C) (Oral)   Resp 16   Ht 5' (1.524 m)   Wt 66.7 kg (147 lb)   SpO2 99%   BMI 28.71 kg/m   Vital signs normal    Physical Exam  Constitutional: She is oriented to person, place, and time. She appears well-developed and well-nourished.  Non-toxic appearance. She does not appear ill. She appears distressed.  HENT:  Head: Normocephalic and atraumatic.  Right Ear: External ear normal.  Left Ear: External ear normal.  Nose: Nose normal. No mucosal edema or rhinorrhea.  Mouth/Throat: Oropharynx is clear and moist and mucous membranes are normal. No dental abscesses or uvula swelling.  Eyes: Conjunctivae and EOM are normal. Pupils are equal, round, and reactive to light.  Neck: Normal range of motion and full passive range of motion without pain. Neck supple.  Cardiovascular: Normal rate, regular rhythm and normal heart sounds. Exam reveals no gallop and no friction rub.  No murmur heard. Pulmonary/Chest: Effort normal and breath sounds normal. No respiratory distress. She has no wheezes. She has no rhonchi. She has no rales. She exhibits no tenderness and no crepitus.  Abdominal: Soft. Normal appearance and bowel sounds are normal. She exhibits no distension. There is tenderness. There is no rebound and no guarding.    Plus left CVA tenderness and diffuse tenderness in her left abdomen  Musculoskeletal: Normal range of motion. She exhibits no edema or tenderness.  Moves all extremities well.   Neurological: She is alert and oriented to person, place, and time. She has normal strength. No cranial nerve deficit.  Skin: Skin is warm, dry and intact. No rash noted. No erythema. No pallor.  Psychiatric: She has a normal mood and affect. Her speech is normal and behavior is normal. Her mood appears not anxious.  Nursing note and vitals reviewed.    ED Treatments / Results  Labs (all labs ordered are listed, but only abnormal results are displayed)  Results for orders  placed or performed during the hospital encounter of 03/10/17  Urinalysis, Routine w reflex microscopic  Result Value Ref Range   Color, Urine YELLOW YELLOW   APPearance HAZY (A) CLEAR   Specific Gravity, Urine 1.025 1.005 - 1.030   pH 6.0 5.0 - 8.0   Glucose, UA NEGATIVE NEGATIVE mg/dL   Hgb urine dipstick MODERATE (A) NEGATIVE   Bilirubin Urine NEGATIVE NEGATIVE   Ketones, ur NEGATIVE NEGATIVE mg/dL   Protein, ur 30 (A) NEGATIVE mg/dL   Nitrite NEGATIVE NEGATIVE   Leukocytes, UA TRACE (A) NEGATIVE   RBC /  HPF TOO NUMEROUS TO COUNT 0 - 5 RBC/hpf   WBC, UA 6-30 0 - 5 WBC/hpf   Bacteria, UA RARE (A) NONE SEEN   Squamous Epithelial / LPF 6-30 (A) NONE SEEN   Mucus PRESENT   Pregnancy, urine  Result Value Ref Range   Preg Test, Ur NEGATIVE NEGATIVE  Basic metabolic panel  Result Value Ref Range   Sodium 141 135 - 145 mmol/L   Potassium 3.7 3.5 - 5.1 mmol/L   Chloride 107 101 - 111 mmol/L   CO2 23 22 - 32 mmol/L   Glucose, Bld 104 (H) 65 - 99 mg/dL   BUN 12 6 - 20 mg/dL   Creatinine, Ser 1.61 0.44 - 1.00 mg/dL   Calcium 9.5 8.9 - 09.6 mg/dL   GFR calc non Af Amer >60 >60 mL/min   GFR calc Af Amer >60 >60 mL/min   Anion gap 11 5 - 15  CBC  Result Value Ref Range   WBC 9.6 4.0 - 10.5 K/uL   RBC 4.82 3.87 - 5.11 MIL/uL   Hemoglobin 14.2 12.0 - 15.0 g/dL   HCT 04.5 40.9 - 81.1 %   MCV 88.4 78.0 - 100.0 fL   MCH 29.5 26.0 - 34.0 pg   MCHC 33.3 30.0 - 36.0 g/dL   RDW 91.4 78.2 - 95.6 %   Platelets 303 150 - 400 K/uL  Lipase, blood  Result Value Ref Range   Lipase 40 11 - 51 U/L   Laboratory interpretation all normal except terminating UA, questionable UTI, urine culture sent.    EKG  EKG Interpretation None       Radiology Ct Renal Stone Study  Result Date: 03/10/2017 CLINICAL DATA:  Acute onset of sharp left flank pain, radiating to the left lower quadrant. Nausea and vomiting. EXAM: CT ABDOMEN AND PELVIS WITHOUT CONTRAST TECHNIQUE: Multidetector CT imaging of  the abdomen and pelvis was performed following the standard protocol without IV contrast. COMPARISON:  CT of the abdomen and pelvis performed 04/10/2016, and pelvic ultrasound performed 10/17/2016 FINDINGS: Lower chest: The visualized lung bases are grossly clear. The visualized portions of the mediastinum are unremarkable. Hepatobiliary: The liver is unremarkable in appearance. The gallbladder is unremarkable in appearance. The common bile duct remains normal in caliber. Pancreas: The pancreas is within normal limits. Spleen: The spleen is unremarkable in appearance. Adrenals/Urinary Tract: The adrenal glands are unremarkable in appearance. There is mild left-sided hydronephrosis, with 3 stones noted in the proximal left ureter, measuring 2 mm, 4 mm and 2 mm. There is also a nonobstructing 4 mm stone at the lower pole of the left kidney. No significant perinephric stranding is appreciated. Stomach/Bowel: The stomach is unremarkable in appearance. The small bowel is within normal limits. The appendix is normal in caliber, without evidence of appendicitis. The colon is unremarkable in appearance. Vascular/Lymphatic: The abdominal aorta is unremarkable in appearance. The inferior vena cava is grossly unremarkable. No retroperitoneal lymphadenopathy is seen. No pelvic sidewall lymphadenopathy is identified. Reproductive: The bladder is mildly distended and grossly unremarkable. The uterus is unremarkable in appearance. The ovaries are grossly symmetric. No suspicious adnexal masses are seen. Other: No additional soft tissue abnormalities are seen. Musculoskeletal: No acute osseous abnormalities are identified. The visualized musculature is unremarkable in appearance. IMPRESSION: 1. Mild left-sided hydronephrosis, with 3 stones noted in the proximal left ureter, measuring 2 mm, 4 mm and 2 mm. 2. Nonobstructing 4 mm stone at the lower pole of the left kidney. Electronically Signed  By: Roanna Raider M.D.   On:  03/10/2017 05:47    Procedures Procedures (including critical care time)  Medications Ordered in ED Medications  fentaNYL (SUBLIMAZE) injection 50 mcg (not administered)  ciprofloxacin (CIPRO) tablet 500 mg (not administered)  ketorolac (TORADOL) 30 MG/ML injection 30 mg (30 mg Intravenous Given 03/10/17 0531)  metoCLOPramide (REGLAN) injection 10 mg (10 mg Intravenous Given 03/10/17 0528)  diphenhydrAMINE (BENADRYL) injection 25 mg (25 mg Intravenous Given 03/10/17 0531)     Initial Impression / Assessment and Plan / ED Course  I have reviewed the triage vital signs and the nursing notes.  Pertinent labs & imaging results that were available during my care of the patient were reviewed by me and considered in my medical decision making (see chart for details).     Patient was given IV Toradol for suspected kidney stone, she also was given IV Reglan and Benadryl for nausea, she states Zofran makes her more nauseated.  When I review her last CT scan which was March 2018 she was noted to have a 3 mm left renal stone.  The same stone was also present on a CT scan done in December 2015.  Recheck at 6:35 AM patient states her pain is better.  She has refused any other pain medication.  We discussed her CT exam which since a year ago now she has 3 new stones that are all in her ureter causing hydronephrosis.  We discussed need for follow-up with urology.  We also discussed reasons to return to the emergency department such as fever, uncontrolled vomiting, or pain not controlled with pain medication that she will go home with.  Patient was reminded we need a urinalysis to make sure she does not have and infection.   Recheck at 7:30 AM she states her pain is starting to return mildly.  She was given IV fentanyl.  Patient states she wants to go to work at noon.  7:50 AM patient's urinalysis has resulted, it is questionable positive for UTI.  Urine culture was sent, patient was started on Cipro  orally.  Final Clinical Impressions(s) / ED Diagnoses   Final diagnoses:  Left ureteral stone  Ureteral stone with hydronephrosis  Urinary tract infection with hematuria, site unspecified    ED Discharge Orders        Ordered    oxyCODONE-acetaminophen (PERCOCET/ROXICET) 5-325 MG tablet  Every 6 hours PRN     03/10/17 0718    promethazine (PHENERGAN) 25 MG tablet  Every 6 hours PRN     03/10/17 0718    tamsulosin (FLOMAX) 0.4 MG CAPS capsule     03/10/17 0718    ciprofloxacin (CIPRO) 500 MG tablet  2 times daily     03/10/17 0751      Plan discharge  Devoria Albe, MD, Concha Pyo, MD 03/10/17 [phone]

## 2017-03-11 LAB — URINE CULTURE
Culture: NO GROWTH
Special Requests: NORMAL

## 2017-03-21 ENCOUNTER — Emergency Department (HOSPITAL_COMMUNITY)
Admission: EM | Admit: 2017-03-21 | Discharge: 2017-03-21 | Disposition: A | Discharge status: Home / Self Care | Payer: BC Managed Care – PPO | Attending: Emergency Medicine | Admitting: Emergency Medicine

## 2017-03-21 ENCOUNTER — Encounter (HOSPITAL_COMMUNITY): Payer: Self-pay | Admitting: Emergency Medicine

## 2017-03-21 DIAGNOSIS — J45909 Unspecified asthma, uncomplicated: Secondary | ICD-10-CM | POA: Diagnosis not present

## 2017-03-21 DIAGNOSIS — Z79899 Other long term (current) drug therapy: Secondary | ICD-10-CM | POA: Diagnosis not present

## 2017-03-21 DIAGNOSIS — Z3202 Encounter for pregnancy test, result negative: Secondary | ICD-10-CM | POA: Insufficient documentation

## 2017-03-21 DIAGNOSIS — N12 Tubulo-interstitial nephritis, not specified as acute or chronic: Secondary | ICD-10-CM | POA: Diagnosis not present

## 2017-03-21 DIAGNOSIS — N200 Calculus of kidney: Secondary | ICD-10-CM | POA: Insufficient documentation

## 2017-03-21 DIAGNOSIS — R109 Unspecified abdominal pain: Secondary | ICD-10-CM | POA: Diagnosis present

## 2017-03-21 LAB — BASIC METABOLIC PANEL
ANION GAP: 13 (ref 5–15)
BUN: 8 mg/dL (ref 6–20)
CHLORIDE: 101 mmol/L (ref 101–111)
CO2: 24 mmol/L (ref 22–32)
Calcium: 9.1 mg/dL (ref 8.9–10.3)
Creatinine, Ser: 0.99 mg/dL (ref 0.44–1.00)
GFR calc non Af Amer: >60 mL/min (ref >60)
Glucose, Bld: 95 mg/dL (ref 65–99)
POTASSIUM: 3.3 mmol/L — AB (ref 3.5–5.1)
SODIUM: 138 mmol/L (ref 135–145)

## 2017-03-21 LAB — CBC WITH DIFFERENTIAL/PLATELET
BASOS PCT: 0 %
Basophils Absolute: 0 10*3/uL (ref 0.0–0.1)
EOS ABS: 0.1 10*3/uL (ref 0.0–0.7)
Eosinophils Relative: 1 %
HCT: 44 % (ref 36.0–46.0)
HEMOGLOBIN: 14.5 g/dL (ref 12.0–15.0)
LYMPHS ABS: 3.2 10*3/uL (ref 0.7–4.0)
Lymphocytes Relative: 22 %
MCH: 29.4 pg (ref 26.0–34.0)
MCHC: 33 g/dL (ref 30.0–36.0)
MCV: 89.1 fL (ref 78.0–100.0)
Monocytes Absolute: 1 10*3/uL (ref 0.1–1.0)
Monocytes Relative: 7 %
NEUTROS ABS: 10.4 10*3/uL — AB (ref 1.7–7.7)
NEUTROS PCT: 70 %
Platelets: 263 10*3/uL (ref 150–400)
RBC: 4.94 MIL/uL (ref 3.87–5.11)
RDW: 12.8 % (ref 11.5–15.5)
WBC: 14.7 10*3/uL — ABNORMAL HIGH (ref 4.0–10.5)

## 2017-03-21 LAB — URINALYSIS, ROUTINE W REFLEX MICROSCOPIC
BILIRUBIN URINE: NEGATIVE
GLUCOSE, UA: NEGATIVE mg/dL
KETONES UR: NEGATIVE mg/dL
NITRITE: NEGATIVE
PROTEIN: 100 mg/dL — AB
Specific Gravity, Urine: 1.024 (ref 1.005–1.030)
pH: 7 (ref 5.0–8.0)

## 2017-03-21 LAB — PREGNANCY, URINE: Preg Test, Ur: NEGATIVE

## 2017-03-21 MED ORDER — KETOROLAC TROMETHAMINE 30 MG/ML IJ SOLN
30.0000 mg | Freq: Once | INTRAMUSCULAR | Status: AC
  Administered 2017-03-21: 30 mg via INTRAVENOUS
  Filled 2017-03-21: qty 1

## 2017-03-21 MED ORDER — TAMSULOSIN HCL 0.4 MG PO CAPS: 0.4000 mg | ORAL_CAPSULE | Freq: Every day | ORAL | 0 refills | Status: DC

## 2017-03-21 MED ORDER — PROMETHAZINE HCL 25 MG/ML IJ SOLN
12.5000 mg | Freq: Once | INTRAMUSCULAR | Status: AC
  Administered 2017-03-21: 12.5 mg via INTRAVENOUS
  Filled 2017-03-21: qty 1

## 2017-03-21 MED ORDER — TAMSULOSIN HCL 0.4 MG PO CAPS
0.4000 mg | ORAL_CAPSULE | Freq: Once | ORAL | Status: AC
  Administered 2017-03-21: 0.4 mg via ORAL
  Filled 2017-03-21: qty 1

## 2017-03-21 MED ORDER — HYDROMORPHONE HCL 1 MG/ML IJ SOLN
1.0000 mg | INTRAMUSCULAR | Status: DC | PRN
  Administered 2017-03-21: 1 mg via INTRAVENOUS
  Filled 2017-03-21: qty 1

## 2017-03-21 MED ORDER — SODIUM CHLORIDE 0.9 % IV BOLUS (SEPSIS)
1000.0000 mL | Freq: Once | INTRAVENOUS | Status: AC
  Administered 2017-03-21: 1000 mL via INTRAVENOUS

## 2017-03-21 MED ORDER — PROMETHAZINE HCL 25 MG PO TABS: 25.0000 mg | ORAL_TABLET | Freq: Four times a day (QID) | ORAL | 0 refills | Status: DC | PRN

## 2017-03-21 MED ORDER — SULFAMETHOXAZOLE-TRIMETHOPRIM 800-160 MG PO TABS: 2.0000 | ORAL_TABLET | Freq: Two times a day (BID) | ORAL | 0 refills | Status: DC

## 2017-03-21 MED ORDER — OXYCODONE-ACETAMINOPHEN 5-325 MG PO TABS
2.0000 | ORAL_TABLET | ORAL | 0 refills | Status: DC | PRN
Start: 2017-03-21 — End: 2018-12-17

## 2017-03-21 NOTE — ED Notes (Signed)
Pt informed of need for urine sample. Pt states she cannot provide one at this time but will inform nursing staff when able to do so.

## 2017-03-21 NOTE — Discharge Instructions (Signed)
Call tomorrow to schedule outpatient urology evaluation Phenergan for nausea.  Percocet for pain.

## 2017-03-21 NOTE — ED Provider Notes (Signed)
[facility] EMERGENCY DEPARTMENT Provider Note   CSN: [id number] Arrival date & time: 03/21/17  1634     History   Chief Complaint Chief Complaint  Patient presents with  . Flank Pain    HPI Angelica Kim is a 27 y.o. female.  Complaint is flank pain, history of kidney stone.  HPI 27 year old female.  Recent diagnosis of left ureteral colic.  Most recent CT scan showed 4, 2, and 2 mm ureteral stones.  It was discharged with antibiotics pain medication antiemetics and Flomax.  Has not called urology because she states that she was "told to wait until the medicine was gone and see if the pain came back".  Symptomatic again today for left-sided pain nausea vomiting.  No fever.  Past Medical History:  Diagnosis Date  . Ankylosing spondylitis (HCC)   . Asthma   . Fibromyalgia   . Kidney stones   . Migraine   . Ovarian cyst     Patient Active Problem List   Diagnosis Date Noted  . Generalized anxiety disorder 04/07/2014  . Migraines 12/21/2013  . Low back pain 12/21/2013  . Cognitive changes 12/21/2013    Past Surgical History:  Procedure Laterality Date  . COLONOSCOPY    . ESOPHAGOGASTRODUODENOSCOPY ENDOSCOPY    . spinal tap    . WISDOM TOOTH EXTRACTION      OB History    Gravida Para Term Preterm AB Living             0   SAB TAB Ectopic Multiple Live Births                   Home Medications    Prior to Admission medications   Medication Sig Start Date End Date Taking? Authorizing Provider  albuterol (PROVENTIL HFA;VENTOLIN HFA) 108 (90 BASE) MCG/ACT inhaler Inhale 2 puffs into the lungs every 6 (six) hours as needed for wheezing or shortness of breath.   Yes [provider]  fluticasone (FLOVENT DISKUS) 50 MCG/BLIST diskus inhaler Inhale 2 puffs into the lungs 2 (two) times daily.    Yes [provider]  ibuprofen (ADVIL,MOTRIN) 200 MG tablet Take 800 mg by mouth every 6 (six) hours as needed for fever, headache, mild pain, moderate pain or  cramping.   Yes [provider]  mometasone (NASONEX) 50 MCG/ACT nasal spray Place 2 sprays into the nose 2 (two) times daily.    Yes [provider]  SUMAtriptan (IMITREX) 100 MG tablet May repeat in 2 hours if headache persists or recurs. 02/16/15  Yes Anson Fret, MD  ciprofloxacin (CIPRO) 500 MG tablet Take 1 tablet (500 mg total) by mouth 2 (two) times daily. Patient not taking: Reported on 03/21/2017 03/10/17   Devoria Albe, MD  oxyCODONE-acetaminophen (PERCOCET/ROXICET) 5-325 MG tablet Take 2 tablets by mouth every 4 (four) hours as needed. 03/21/17   Rolland Porter, MD  promethazine (PHENERGAN) 25 MG tablet Take 1 tablet (25 mg total) by mouth every 6 (six) hours as needed for nausea or vomiting. 03/21/17   Rolland Porter, MD  sulfamethoxazole-trimethoprim (BACTRIM DS,SEPTRA DS) 800-160 MG tablet Take 2 tablets by mouth 2 (two) times daily. 03/21/17   Rolland Porter, MD  tamsulosin (FLOMAX) 0.4 MG CAPS capsule Take 1 capsule (0.4 mg total) by mouth daily. 03/21/17   Rolland Porter, MD    Family History Family History  Problem Relation Age of Onset  . Arthritis Mother   . Hypertension Maternal Grandmother   . Thyroid disease Maternal  Grandmother   . Kidney Stones Maternal Grandfather     Social History Social History   Tobacco Use  . Smoking status: Never Smoker  . Smokeless tobacco: Never Used  Substance Use Topics  . Alcohol use: Yes    Alcohol/week: 0.0 oz    Comment: occassionally wine  every other weekend  . Drug use: No     Allergies   Amoxicillin and Zofran [ondansetron hcl]   Review of Systems Review of Systems  Constitutional: Negative for appetite change, chills, diaphoresis, fatigue and fever.  HENT: Negative for mouth sores, sore throat and trouble swallowing.   Eyes: Negative for visual disturbance.  Respiratory: Negative for cough, chest tightness, shortness of breath and wheezing.   Cardiovascular: Negative for chest pain.  Gastrointestinal:  Positive for abdominal pain, nausea and vomiting. Negative for abdominal distention and diarrhea.  Endocrine: Negative for polydipsia, polyphagia and polyuria.  Genitourinary: Positive for flank pain. Negative for dysuria, frequency and hematuria.  Musculoskeletal: Negative for gait problem.  Skin: Negative for color change, pallor and rash.  Neurological: Negative for dizziness, syncope, light-headedness and headaches.  Hematological: Does not bruise/bleed easily.  Psychiatric/Behavioral: Negative for behavioral problems and confusion.     Physical Exam Updated Vital Signs BP 112/77 (BP Location: Left Arm)   Pulse 79   Temp 98 F (36.7 C) (Oral)   Resp 20   Ht 5\' 4"  (1.626 m)   Wt 65.8 kg (145 lb)   SpO2 100%   BMI 24.89 kg/m   Physical Exam  Constitutional: She is oriented to person, place, and time. She appears well-developed and well-nourished. No distress.  27 year old female.  Writhing in pain.  HENT:  Head: Normocephalic.  Eyes: Conjunctivae are normal. Pupils are equal, round, and reactive to light. No scleral icterus.  Neck: Normal range of motion. Neck supple. No thyromegaly present.  Cardiovascular: Normal rate and regular rhythm. Exam reveals no gallop and no friction rub.  No murmur heard. Pulmonary/Chest: Effort normal and breath sounds normal. No respiratory distress. She has no wheezes. She has no rales.  Abdominal: Soft. Bowel sounds are normal. She exhibits no distension. There is no tenderness. There is no rebound.  Points to her left flank.  Normal appearance of the skin.  Benign abdominal exam.  Musculoskeletal: Normal range of motion.  Neurological: She is alert and oriented to person, place, and time.  Skin: Skin is warm and dry. No rash noted.  Psychiatric: She has a normal mood and affect. Her behavior is normal.     ED Treatments / Results  Labs (all labs ordered are listed, but only abnormal results are displayed) Labs Reviewed  CBC WITH  DIFFERENTIAL/PLATELET - Abnormal; Notable for the following components:      Result Value   WBC 14.7 (*)    Neutro Abs 10.4 (*)    All other components within normal limits  BASIC METABOLIC PANEL - Abnormal; Notable for the following components:   Potassium 3.3 (*)    All other components within normal limits  URINALYSIS, ROUTINE W REFLEX MICROSCOPIC - Abnormal; Notable for the following components:   APPearance CLOUDY (*)    Hgb urine dipstick SMALL (*)    Protein, ur 100 (*)    Leukocytes, UA MODERATE (*)    Bacteria, UA RARE (*)    Squamous Epithelial / LPF 6-30 (*)    All other components within normal limits  PREGNANCY, URINE    EKG  EKG Interpretation None  Radiology No results found.  Procedures Procedures (including critical care time)  Medications Ordered in ED Medications  HYDROmorphone (DILAUDID) injection 1 mg (1 mg Intravenous Given 03/21/17 1703)  ketorolac (TORADOL) 30 MG/ML injection 30 mg (30 mg Intravenous Given 03/21/17 1703)  tamsulosin (FLOMAX) capsule 0.4 mg (0.4 mg Oral Given 03/21/17 1703)  promethazine (PHENERGAN) injection 12.5 mg (12.5 mg Intravenous Given 03/21/17 1702)  sodium chloride 0.9 % bolus 1,000 mL (0 mLs Intravenous Stopped 03/21/17 2241)     Initial Impression / Assessment and Plan / ED Course  I have reviewed the triage vital signs and the nursing notes.  Pertinent labs & imaging results that were available during my care of the patient were reviewed by me and considered in my medical decision making (see chart for details).     IV is placed.  Given pain medication, antiemetics.  Urine shows RBCs, WBC is bacteria.  Afebrile.  Leukocytosis.  Tolerating symptoms after 1 dose IV pain meds fluids and antiemetics.  Will cover with Keflex.  Discharged home.  Flomax, Percocet, Phenergan, urology follow-up.  Call for appointment.  Final Clinical Impressions(s) / ED Diagnoses   Final diagnoses:  Kidney stone  Pyelonephritis     ED Discharge Orders        Ordered    oxyCODONE-acetaminophen (PERCOCET/ROXICET) 5-325 MG tablet  Every 4 hours PRN     03/21/17 2250    tamsulosin (FLOMAX) 0.4 MG CAPS capsule  Daily     03/21/17 2250    sulfamethoxazole-trimethoprim (BACTRIM DS,SEPTRA DS) 800-160 MG tablet  2 times daily     03/21/17 2250    promethazine (PHENERGAN) 25 MG tablet  Every 6 hours PRN     03/21/17 2250       [name], Griffon Herberg, MD 03/21/17 2254

## 2017-03-21 NOTE — ED Triage Notes (Signed)
Patient complaining of left flank pain starting suddenly at 1430 today. States she was treated here for kidney stones recently.

## 2017-03-22 ENCOUNTER — Encounter (HOSPITAL_COMMUNITY): Payer: Self-pay | Admitting: *Deleted

## 2017-03-22 ENCOUNTER — Other Ambulatory Visit: Payer: Self-pay

## 2017-03-22 ENCOUNTER — Emergency Department (HOSPITAL_COMMUNITY): Payer: BC Managed Care – PPO

## 2017-03-22 ENCOUNTER — Emergency Department (HOSPITAL_COMMUNITY)
Admission: EM | Admit: 2017-03-22 | Discharge: 2017-03-22 | Disposition: A | Discharge status: Home / Self Care | Payer: BC Managed Care – PPO | Attending: Emergency Medicine | Admitting: Emergency Medicine

## 2017-03-22 DIAGNOSIS — J45909 Unspecified asthma, uncomplicated: Secondary | ICD-10-CM | POA: Insufficient documentation

## 2017-03-22 DIAGNOSIS — N132 Hydronephrosis with renal and ureteral calculous obstruction: Secondary | ICD-10-CM | POA: Diagnosis not present

## 2017-03-22 DIAGNOSIS — R1032 Left lower quadrant pain: Secondary | ICD-10-CM | POA: Diagnosis present

## 2017-03-22 DIAGNOSIS — Z79899 Other long term (current) drug therapy: Secondary | ICD-10-CM | POA: Insufficient documentation

## 2017-03-22 DIAGNOSIS — N23 Unspecified renal colic: Secondary | ICD-10-CM | POA: Diagnosis not present

## 2017-03-22 LAB — COMPREHENSIVE METABOLIC PANEL
ALT: 11 U/L — ABNORMAL LOW (ref 14–54)
ANION GAP: 9 (ref 5–15)
AST: 16 U/L (ref 15–41)
Albumin: 3.5 g/dL (ref 3.5–5.0)
Alkaline Phosphatase: 71 U/L (ref 38–126)
BUN: 9 mg/dL (ref 6–20)
CHLORIDE: 107 mmol/L (ref 101–111)
CO2: 23 mmol/L (ref 22–32)
Calcium: 8.9 mg/dL (ref 8.9–10.3)
Creatinine, Ser: 1.3 mg/dL — ABNORMAL HIGH (ref 0.44–1.00)
GFR, EST NON AFRICAN AMERICAN: 56 mL/min — AB (ref >60)
Glucose, Bld: 94 mg/dL (ref 65–99)
POTASSIUM: 3.8 mmol/L (ref 3.5–5.1)
Sodium: 139 mmol/L (ref 135–145)
Total Bilirubin: 1.4 mg/dL — ABNORMAL HIGH (ref 0.3–1.2)
Total Protein: 6.4 g/dL — ABNORMAL LOW (ref 6.5–8.1)

## 2017-03-22 LAB — URINALYSIS, ROUTINE W REFLEX MICROSCOPIC
BILIRUBIN URINE: NEGATIVE
GLUCOSE, UA: NEGATIVE mg/dL
Ketones, ur: 5 mg/dL — AB
NITRITE: NEGATIVE
PH: 6 (ref 5.0–8.0)
Protein, ur: 30 mg/dL — AB
SPECIFIC GRAVITY, URINE: 1.025 (ref 1.005–1.030)

## 2017-03-22 LAB — I-STAT BETA HCG BLOOD, ED (MC, WL, AP ONLY): I-stat hCG, quantitative: <5 m[IU]/mL (ref <5)

## 2017-03-22 LAB — CBC
HCT: 40.8 % (ref 36.0–46.0)
Hemoglobin: 13.9 g/dL (ref 12.0–15.0)
MCH: 30.3 pg (ref 26.0–34.0)
MCHC: 34.1 g/dL (ref 30.0–36.0)
MCV: 89.1 fL (ref 78.0–100.0)
Platelets: 229 10*3/uL (ref 150–400)
RBC: 4.58 MIL/uL (ref 3.87–5.11)
RDW: 12.8 % (ref 11.5–15.5)
WBC: 13.1 10*3/uL — ABNORMAL HIGH (ref 4.0–10.5)

## 2017-03-22 LAB — I-STAT CG4 LACTIC ACID, ED: LACTIC ACID, VENOUS: 0.94 mmol/L (ref 0.5–1.9)

## 2017-03-22 LAB — CREATININE, SERUM
Creatinine, Ser: 1.13 mg/dL — ABNORMAL HIGH (ref 0.44–1.00)
GFR calc Af Amer: >60 mL/min (ref >60)
GFR calc non Af Amer: >60 mL/min (ref >60)

## 2017-03-22 MED ORDER — METOCLOPRAMIDE HCL 5 MG/ML IJ SOLN
10.0000 mg | Freq: Once | INTRAMUSCULAR | Status: AC
  Administered 2017-03-22: 10 mg via INTRAVENOUS
  Filled 2017-03-22: qty 2

## 2017-03-22 MED ORDER — METOCLOPRAMIDE HCL 10 MG PO TABS: 10.0000 mg | ORAL_TABLET | Freq: Four times a day (QID) | ORAL | 0 refills | Status: DC

## 2017-03-22 MED ORDER — KETOROLAC TROMETHAMINE 30 MG/ML IJ SOLN
30.0000 mg | Freq: Once | INTRAMUSCULAR | Status: AC
  Administered 2017-03-22: 30 mg via INTRAVENOUS
  Filled 2017-03-22: qty 1

## 2017-03-22 MED ORDER — KETOROLAC TROMETHAMINE 10 MG PO TABS: 10.0000 mg | ORAL_TABLET | Freq: Four times a day (QID) | ORAL | 0 refills | Status: DC | PRN

## 2017-03-22 MED ORDER — SODIUM CHLORIDE 0.9 % IV BOLUS (SEPSIS)
1000.0000 mL | Freq: Once | INTRAVENOUS | Status: AC
  Administered 2017-03-22: 1000 mL via INTRAVENOUS

## 2017-03-22 MED ORDER — KETOROLAC TROMETHAMINE 30 MG/ML IJ SOLN: 30.0000 mg | Freq: Once | INTRAMUSCULAR | Status: DC

## 2017-03-22 MED ORDER — HYDROMORPHONE HCL 1 MG/ML IJ SOLN
1.0000 mg | Freq: Once | INTRAMUSCULAR | Status: AC
  Administered 2017-03-22: 1 mg via INTRAVENOUS
  Filled 2017-03-22: qty 1

## 2017-03-22 NOTE — ED Notes (Signed)
Bladder scan completed. 95 ml resulted. RN and PA notified.

## 2017-03-22 NOTE — Discharge Instructions (Signed)
Keep your appointment with urology on Monday.  You are given a dose of IV Toradol at around 1020.  Please do not take another dose for 6 hours.    After that time, you can take 1 tablet every 6 hours as needed for severe pain.  For mild to moderate pain, you can take 650 mg of Tylenol once every 6 hours.  While you are taking Toradol, it is extremely important that you stay hydrated to avoid injuring your kidneys.    You can take 1 tablet of Reglan or Phenergan once every 6 hours as needed for nausea and vomiting.  Please avoid taking both at the same time.  Please only use one or the other.  If you develop new or worsening symptoms, including a high fever that does not improve with Tylenol, uncontrollable pain, vomiting despite taking Phenergan or Reglan, or other concerning symptoms, please return to the emergency department for reevaluation.  As we discussed, if you do need to return to the emergency department, [facility] if urology needed to do a procedure it would be performed there instead of [place].

## 2017-03-22 NOTE — ED Notes (Signed)
Patient transported to Ultrasound 

## 2017-03-22 NOTE — ED Provider Notes (Signed)
[facility] EMERGENCY DEPARTMENT Provider Note   CSN: [id number] Arrival date & time: 03/22/17  1322     History   Chief Complaint Chief Complaint  Patient presents with  . Flank Pain    HPI Angelica Kim is a 27 y.o. female with history of ankylosing spondylitis, ovarian cysts, asthma, nephrolithiasis, and generalized anxiety disorder who presents to the emergency department with a chief complaint of left flank pain, nausea, and vomiting.  The patient reports that she was seen in the emergency department on February 10 and diagnosed with kidney stones.  CT demonstrating mild left-sided hydronephrosis with 3 stones noted in the proximal left ureter measuring 2 mm, 4 mm, and 2 mm.  She also has a nonobstructing 4 mm stone at the lower pole of the left kidney.  He was discharged home with Percocet, Phenergan for nausea, Flomax, and ciprofloxacin for questionable UTI.   Return to the ED yesterday for continued left-sided flank pain, nausea, vomiting.  She states that she had tried to call the urology and was told to "wait till the medicine was gone and see if the pain came back."  She was discharged with Percocet, Flomax, and Bactrim for possible pyelonephritis and Phenergan for nausea.  She states that she has been unable to control her left flank pain with the home Percocet.  She also has continued to vomit despite taking Phenergan.  She also reports decreased urine output since yesterday.  She states "it feels like something is blocking it from coming out."   Denies fever, chills, hematuria, diarrhea, constipation, rash, dyspnea, or chest pain.  She reports that she has spoken with urology, but they were unable to see her until next Monday, 3 days from now.  The history is provided by the patient. No language interpreter was used.    Past Medical History:  Diagnosis Date  . Ankylosing spondylitis (HCC)   . Asthma   . Fibromyalgia   . Kidney stones   . Migraine     . Ovarian cyst     Patient Active Problem List   Diagnosis Date Noted  . Generalized anxiety disorder 04/07/2014  . Migraines 12/21/2013  . Low back pain 12/21/2013  . Cognitive changes 12/21/2013    Past Surgical History:  Procedure Laterality Date  . COLONOSCOPY    . ESOPHAGOGASTRODUODENOSCOPY ENDOSCOPY    . spinal tap    . WISDOM TOOTH EXTRACTION      OB History    Gravida Para Term Preterm AB Living             0   SAB TAB Ectopic Multiple Live Births                   Home Medications    Prior to Admission medications   Medication Sig Start Date End Date Taking? Authorizing Provider  albuterol (PROVENTIL HFA;VENTOLIN HFA) 108 (90 BASE) MCG/ACT inhaler Inhale 2 puffs into the lungs every 6 (six) hours as needed for wheezing or shortness of breath.    [provider]  ciprofloxacin (CIPRO) 500 MG tablet Take 1 tablet (500 mg total) by mouth 2 (two) times daily. Patient not taking: Reported on 03/21/2017 03/10/17   Devoria Albe, MD  fluticasone (FLOVENT DISKUS) 50 MCG/BLIST diskus inhaler Inhale 2 puffs into the lungs 2 (two) times daily.     [provider]  ibuprofen (ADVIL,MOTRIN) 200 MG tablet Take 800 mg by mouth every 6 (six) hours as needed for fever, headache,  mild pain, moderate pain or cramping.    [provider]  ketorolac (TORADOL) 10 MG tablet Take 1 tablet (10 mg total) by mouth every 6 (six) hours as needed. 03/22/17   Lacreshia Bondarenko A, PA-C  metoCLOPramide (REGLAN) 10 MG tablet Take 1 tablet (10 mg total) by mouth every 6 (six) hours. 03/22/17   Deveron Shamoon A, PA-C  mometasone (NASONEX) 50 MCG/ACT nasal spray Place 2 sprays into the nose 2 (two) times daily.     [provider]  oxyCODONE-acetaminophen (PERCOCET/ROXICET) 5-325 MG tablet Take 2 tablets by mouth every 4 (four) hours as needed. 03/21/17   Rolland Porter, MD  promethazine (PHENERGAN) 25 MG tablet Take 1 tablet (25 mg total) by mouth every 6 (six) hours as needed for  nausea or vomiting. 03/21/17   Rolland Porter, MD  sulfamethoxazole-trimethoprim (BACTRIM DS,SEPTRA DS) 800-160 MG tablet Take 2 tablets by mouth 2 (two) times daily. 03/21/17   Rolland Porter, MD  SUMAtriptan (IMITREX) 100 MG tablet May repeat in 2 hours if headache persists or recurs. 02/16/15   Anson Fret, MD  tamsulosin (FLOMAX) 0.4 MG CAPS capsule Take 1 capsule (0.4 mg total) by mouth daily. 03/21/17   Rolland Porter, MD    Family History Family History  Problem Relation Age of Onset  . Arthritis Mother   . Hypertension Maternal Grandmother   . Thyroid disease Maternal Grandmother   . Kidney Stones Maternal Grandfather     Social History Social History   Tobacco Use  . Smoking status: Never Smoker  . Smokeless tobacco: Never Used  Substance Use Topics  . Alcohol use: Yes    Alcohol/week: 0.0 oz    Comment: occassionally wine  every other weekend  . Drug use: No     Allergies   Amoxicillin and Zofran [ondansetron hcl]   Review of Systems Review of Systems  Constitutional: Negative for activity change, chills and fever.  HENT: Negative for congestion.   Eyes: Negative for visual disturbance.  Respiratory: Negative for cough and shortness of breath.   Gastrointestinal: Positive for abdominal pain, nausea and vomiting.  Genitourinary: Positive for decreased urine volume and flank pain. Negative for dysuria.  Musculoskeletal: Negative for back pain.  Skin: Negative for rash.  Allergic/Immunologic: Negative for immunocompromised state.  Neurological: Negative for dizziness, light-headedness and headaches.  Psychiatric/Behavioral: Negative for confusion.   Physical Exam Updated Vital Signs BP 113/68 (BP Location: Left Arm)   Pulse 88   Temp 98.1 F (36.7 C) (Oral)   Resp 18   SpO2 100%   Physical Exam  Constitutional: No distress.  HENT:  Head: Normocephalic.  Eyes: Conjunctivae are normal.  Neck: Normal range of motion. Neck supple.  Cardiovascular: Normal rate,  regular rhythm, normal heart sounds and intact distal pulses. Exam reveals no gallop and no friction rub.  No murmur heard. Pulmonary/Chest: Effort normal and breath sounds normal. No stridor. No respiratory distress. She has no wheezes. She has no rales. She exhibits no tenderness.  Abdominal: Soft. Bowel sounds are normal. She exhibits no distension and no mass. There is tenderness. There is no rebound and no guarding. No hernia.  Tender to palpation to the left lower quadrant without rebound or guarding that tracks along the path of the left ureter, left flank, with left CVA tenderness.  The remainder of the abdominal exam is unremarkable.  No right CVA tenderness.  No peritoneal signs.  Musculoskeletal: Normal range of motion. She exhibits tenderness. She exhibits no edema or deformity.  Neurological: She is alert.  Skin: Skin is warm. Capillary refill takes less than 2 seconds. No rash noted.  Psychiatric: Her behavior is normal.  Nursing note and vitals reviewed.    ED Treatments / Results  Labs (all labs ordered are listed, but only abnormal results are displayed) Labs Reviewed  CBC - Abnormal; Notable for the following components:      Result Value   WBC 13.1 (*)    All other components within normal limits  COMPREHENSIVE METABOLIC PANEL - Abnormal; Notable for the following components:   Creatinine, Ser 1.30 (*)    Total Protein 6.4 (*)    ALT 11 (*)    Total Bilirubin 1.4 (*)    GFR calc non Af Amer 56 (*)    All other components within normal limits  URINALYSIS, ROUTINE W REFLEX MICROSCOPIC - Abnormal; Notable for the following components:   Color, Urine AMBER (*)    APPearance CLOUDY (*)    Hgb urine dipstick LARGE (*)    Ketones, ur 5 (*)    Protein, ur 30 (*)    Leukocytes, UA SMALL (*)    Bacteria, UA RARE (*)    Squamous Epithelial / LPF TOO NUMEROUS TO COUNT (*)    All other components within normal limits  CREATININE, SERUM - Abnormal; Notable for the following  components:   Creatinine, Ser 1.13 (*)    All other components within normal limits  I-STAT CG4 LACTIC ACID, ED  I-STAT BETA HCG BLOOD, ED ([facility], [facility], [facility] ONLY)    EKG  EKG Interpretation None       Radiology US Renal  Result Date: 03/22/2017 CLINICAL DATA:  Left flank pain x2 weeks. History of nephrolithiasis. EXAM: RENAL / URINARY TRACT ULTRASOUND COMPLETE COMPARISON:  CT 03/10/2017 FINDINGS: Right kidney: Length: 12.4 cm. Echogenicity within normal limits. No mass or hydronephrosis visualized. Left Kidney: Length: 13.1 cm. Echogenicity within normal limits. Mild hydronephrosis. No mass visualized. Bladder: Appears normal for degree of bladder distention. A right ureteral jet is documented. Patient declined postvoid imaging. IMPRESSION: 1. Persistent mild left hydronephrosis. 2. No acute findings. Electronically Signed   By: Corlis Leak M.D.   On: 03/22/2017 18:50    Procedures Procedures (including critical care time)  Medications Ordered in ED Medications  HYDROmorphone (DILAUDID) injection 1 mg (1 mg Intravenous Given 03/22/17 1910)  metoCLOPramide (REGLAN) injection 10 mg (10 mg Intravenous Given 03/22/17 1906)  sodium chloride 0.9 % bolus 1,000 mL (0 mLs Intravenous Stopped 03/22/17 2203)  ketorolac (TORADOL) 30 MG/ML injection 30 mg (30 mg Intravenous Given 03/22/17 2214)     Initial Impression / Assessment and Plan / ED Course  I have reviewed the triage vital signs and the nursing notes.  Pertinent labs & imaging results that were available during my care of the patient were reviewed by me and considered in my medical decision making (see chart for details).     27 year old female with a history of ankylosing spondylitis, ovarian cysts, asthma, nephrolithiasis, and generalized anxiety disorder presenting with intractable vomiting after taking her home Zofran, nausea, and uncontrollable left flank pain despite taking home Percocet.  She has known kidney stones in the left  ureter and at the pole of the left kidney.  She states that she has passed 1 of the stones since the original CT on 2/10.  Her appointment with urology as in 3 days.  Symptoms are consistent with ureteral colic.  Urosepsis.  Cytosis 13.1, improved from yesterday.  She  has a new AKI, Cr up 0.30 from yesterday, likely secondary to decreased p.o. Intake.  Bladder scan with 99 ccs. UA with calcium oxalate crystals and many RBCs and a large amount of hemoglobin.  Renal ultrasound with persistent mild left hydronephrosis.  Spoke with Dr. Budzyn who recommended controlling the patient's pain and discharging her with home to attSherryl Bartersempt to pass the stones, since the largest was 4 mm, and to follow-up with urology at her appointment in 3 days.  Recommended [facility] if her pain became uncontrollable.  The patient was discussed and evaluated with Dr. Rush Landmarkegeler, attending physician.  On reevaluation, she reports that her pain is returning.  Repeat CR 1.13 with improved AKI. Pain resolved with IV Toradol.  Will discharge the patient to home with p.o. Toradol and Reglan.  Recommended continuing Bactrim at home. Strict return precautions for uncontrolled emesis or uncontrolled pain.  She is hemodynamically stable and in no acute distress.  Patient safe for discharge at this time.  Final Clinical Impressions(s) / ED Diagnoses   Final diagnoses:  Ureteral stone with hydronephrosis  Ureteral colic    ED Discharge Orders        Ordered    ketorolac (TORADOL) 10 MG tablet  Every 6 hours PRN     03/22/17 2234    metoCLOPramide (REGLAN) 10 MG tablet  Every 6 hours     03/22/17 2234       Frederik PearMcDonald, Edmon Magid A, PA-C 03/23/17 0148    Tegeler, Canary Brimhristopher J, MD 03/23/17 [phone]

## 2017-03-22 NOTE — ED Triage Notes (Signed)
Pt reports being diagnosed with 3 kidney stones recently, has been given flomax with no relief. Now has n/v and urinary retention.

## 2017-03-22 NOTE — ED Provider Notes (Signed)
Patient placed in Quick Look pathway, seen and evaluated   Chief Complaint: Persistent left flank pain with urinary symptoms and associated nausea and vomiting  HPI:   Patient reports ongoing left-sided flank pain and decreased urinary output with associated nausea and vomiting.  Patient has known 3 ureteral stones.  Patient was seen 13 days ago and diagnosed with ureteral stones.  She was given Bactrim, Phenergan, Flomax and pain medication.  States that the vomiting started yesterday.  She was seen back in the ER yesterday for same.  At that time she was given Keflex.  Patient states that she is unable to keep any medications down at home due to her persistent vomiting.  Reports ongoing pain.  Denies any associated fevers.  She has not followed up with urology.  ROS: Flank pain, abdominal pain, nausea, vomiting, decreased urinary output (one)  Physical Exam:   Gen: No distress  Neuro: Awake and Alert  Skin: Warm    Focused Exam: Left-sided CVA tenderness.  Abdominal exam is benign without any focal tenderness.  No signs of peritonitis.  Normal bowel sounds.   Initiation of care has begun. The patient has been counseled on the process, plan, and necessity for staying for the completion/evaluation, and the remainder of the medical screening examination   Discussed with the patient that exiting the department prior to completion of the work-up is AMA and there is no guarantee that there are no emergency medical conditions present.     Rise MuLeaphart, Kenneth T, PA-C 03/22/17 1430    Donnetta Hutchingook, Brian, MD 03/23/17 [phone]

## 2017-12-24 ENCOUNTER — Emergency Department (HOSPITAL_COMMUNITY)
Admission: EM | Admit: 2017-12-24 | Discharge: 2017-12-24 | Disposition: A | Discharge status: Home / Self Care | Payer: BC Managed Care – PPO | Attending: Emergency Medicine | Admitting: Emergency Medicine

## 2017-12-24 ENCOUNTER — Ambulatory Visit (HOSPITAL_COMMUNITY)
Admission: EM | Admit: 2017-12-24 | Discharge: 2017-12-24 | Disposition: A | Discharge status: Home / Self Care | Payer: BC Managed Care – PPO | Source: Home / Self Care

## 2017-12-24 ENCOUNTER — Encounter (HOSPITAL_COMMUNITY): Payer: Self-pay

## 2017-12-24 ENCOUNTER — Other Ambulatory Visit: Payer: Self-pay

## 2017-12-24 ENCOUNTER — Emergency Department (HOSPITAL_COMMUNITY): Payer: BC Managed Care – PPO

## 2017-12-24 DIAGNOSIS — Z79899 Other long term (current) drug therapy: Secondary | ICD-10-CM | POA: Diagnosis not present

## 2017-12-24 DIAGNOSIS — R51 Headache: Secondary | ICD-10-CM | POA: Insufficient documentation

## 2017-12-24 DIAGNOSIS — J45909 Unspecified asthma, uncomplicated: Secondary | ICD-10-CM | POA: Insufficient documentation

## 2017-12-24 DIAGNOSIS — R519 Headache, unspecified: Secondary | ICD-10-CM

## 2017-12-24 LAB — POC URINE PREG, ED: Preg Test, Ur: NEGATIVE

## 2017-12-24 MED ORDER — SUMATRIPTAN SUCCINATE 100 MG PO TABS
100.0000 mg | ORAL_TABLET | Freq: Once | ORAL | Status: AC
  Administered 2017-12-24: 100 mg via ORAL
  Filled 2017-12-24: qty 1

## 2017-12-24 NOTE — ED Provider Notes (Signed)
[facility] EMERGENCY DEPARTMENT Provider Note   CSN: [id number] Arrival date & time: 12/24/17  1037     History   Chief Complaint Chief Complaint  Patient presents with  . Headache    HPI Angelica Kim is a 27 y.o. female.  27 y.o female with a PMH of Migraines presents to the ED with a chief complaint of headache x couple of hours. Patient reports a previous history of migraine reports this is different than her usual migraines.  She describes intermittent sharp pain on the left side of her head along with lesion to the back of her skull.  She also reports some vision changes as she states her vision was blurry when she was trying to write down on her notebook at school.  Reports having to look across the room having to refocus her vision.  Patient was prescribed sumatriptan for her headaches but has not taken medication for relief today, she was told she only need to take it when she had a headache and she has not had 1 of these in a year and a half.  Denies any fever, chest pain, dizziness, photophobia, phonophobia or vomiting.      Past Medical History:  Diagnosis Date  . Ankylosing spondylitis (HCC)   . Asthma   . Fibromyalgia   . Kidney stones   . Migraine   . Ovarian cyst     Patient Active Problem List   Diagnosis Date Noted  . Generalized anxiety disorder 04/07/2014  . Migraines 12/21/2013  . Low back pain 12/21/2013  . Cognitive changes 12/21/2013    Past Surgical History:  Procedure Laterality Date  . COLONOSCOPY    . ESOPHAGOGASTRODUODENOSCOPY ENDOSCOPY    . spinal tap    . WISDOM TOOTH EXTRACTION       OB History    Gravida      Para      Term      Preterm      AB      Living  0     SAB      TAB      Ectopic      Multiple      Live Births               Home Medications    Prior to Admission medications   Medication Sig Start Date End Date Taking? Authorizing Provider  albuterol (PROVENTIL HFA;VENTOLIN  HFA) 108 (90 BASE) MCG/ACT inhaler Inhale 2 puffs into the lungs every 6 (six) hours as needed for wheezing or shortness of breath.    [provider]  ciprofloxacin (CIPRO) 500 MG tablet Take 1 tablet (500 mg total) by mouth 2 (two) times daily. Patient not taking: Reported on 03/21/2017 03/10/17   Devoria Albe, MD  fluticasone (FLOVENT DISKUS) 50 MCG/BLIST diskus inhaler Inhale 2 puffs into the lungs 2 (two) times daily.     [provider]  ibuprofen (ADVIL,MOTRIN) 200 MG tablet Take 800 mg by mouth every 6 (six) hours as needed for fever, headache, mild pain, moderate pain or cramping.    [provider]  ketorolac (TORADOL) 10 MG tablet Take 1 tablet (10 mg total) by mouth every 6 (six) hours as needed. 03/22/17   [name], Mia A, PA-C  metoCLOPramide (REGLAN) 10 MG tablet Take 1 tablet (10 mg total) by mouth every 6 (six) hours. 03/22/17   [name], Mia A, PA-C  mometasone (NASONEX) 50 MCG/ACT nasal spray Place 2 sprays into the  nose 2 (two) times daily.     [provider]  oxyCODONE-acetaminophen (PERCOCET/ROXICET) 5-325 MG tablet Take 2 tablets by mouth every 4 (four) hours as needed. 03/21/17   Rolland Porter, MD  promethazine (PHENERGAN) 25 MG tablet Take 1 tablet (25 mg total) by mouth every 6 (six) hours as needed for nausea or vomiting. 03/21/17   Rolland Porter, MD  sulfamethoxazole-trimethoprim (BACTRIM DS,SEPTRA DS) 800-160 MG tablet Take 2 tablets by mouth 2 (two) times daily. 03/21/17   Rolland Porter, MD  SUMAtriptan (IMITREX) 100 MG tablet May repeat in 2 hours if headache persists or recurs. 02/16/15   Anson Fret, MD  tamsulosin (FLOMAX) 0.4 MG CAPS capsule Take 1 capsule (0.4 mg total) by mouth daily. 03/21/17   Rolland Porter, MD    Family History Family History  Problem Relation Age of Onset  . Arthritis Mother   . Hypertension Maternal Grandmother   . Thyroid disease Maternal Grandmother   . Kidney Stones Maternal Grandfather     Social  History Social History   Tobacco Use  . Smoking status: Never Smoker  . Smokeless tobacco: Never Used  Substance Use Topics  . Alcohol use: Yes    Alcohol/week: 0.0 standard drinks    Comment: occassionally wine  every other weekend  . Drug use: No     Allergies   Amoxicillin and Zofran [ondansetron hcl]   Review of Systems Review of Systems  Constitutional: Negative for chills and fever.  Respiratory: Negative for shortness of breath.   Cardiovascular: Negative for chest pain.  Gastrointestinal: Negative for abdominal pain, diarrhea, nausea and vomiting.  Genitourinary: Negative for dysuria and flank pain.  Musculoskeletal: Negative for back pain.  Skin: Negative for pallor and wound.  Neurological: Positive for headaches. Negative for light-headedness.     Physical Exam Updated Vital Signs BP 120/84 (BP Location: Right Arm)   Pulse 96   Temp 98 F (36.7 C)   Resp 16   Ht 5' (1.524 m)   Wt 68 kg   LMP 11/29/2017 (Exact Date) Comment: Nuva ring  SpO2 100%   BMI 29.29 kg/m   Physical Exam  Constitutional: She is oriented to person, place, and time. She appears well-developed and well-nourished.  Non-toxic appearance. She does not appear ill.  Non-ill appearing texting on cell phone.   HENT:  Head: Normocephalic and atraumatic.  Neck: Normal range of motion. Neck supple.  Cardiovascular: Normal heart sounds.  Pulmonary/Chest: Effort normal.  Abdominal: Soft.  Neurological: She is alert and oriented to person, place, and time. She has normal strength. She displays a negative Romberg sign.  Alert, oriented, thought content appropriate. Speech fluent without evidence of aphasia. Able to follow 2 step commands without difficulty.  Cranial Nerves:  II:  Peripheral visual fields grossly normal, pupils, round, reactive to light III,IV, VI: ptosis not present, extra-ocular motions intact bilaterally  V,VII: smile symmetric, facial light touch sensation equal VIII:  hearing grossly normal bilaterally  IX,X: midline uvula rise  XI: bilateral shoulder shrug equal and strong XII: midline tongue extension  Motor:  5/5 in upper and lower extremities bilaterally including strong and equal grip strength and dorsiflexion/plantar flexion Sensory: light touch normal in all extremities.  Cerebellar: normal finger-to-nose with bilateral upper extremities, pronator drift negative   Skin: Skin is warm and dry.  Nursing note and vitals reviewed.    Visual Acuity  Right Eye Distance:   Left Eye Distance:   Bilateral Distance:    Right Eye Near:  R Near: 20/16 Left Eye Near:  L Near: 20/12.5 Bilateral Near:  20/10   ED Treatments / Results  Labs (all labs ordered are listed, but only abnormal results are displayed) Labs Reviewed  POC URINE PREG, ED    EKG None  Radiology Ct Head Wo Contrast  Result Date: 12/24/2017 CLINICAL DATA:  Acute headache dizziness EXAM: CT HEAD WITHOUT CONTRAST TECHNIQUE: Contiguous axial images were obtained from the base of the skull through the vertex without intravenous contrast. COMPARISON:  None. FINDINGS: Brain: No evidence of acute infarction, hemorrhage, hydrocephalus, extra-axial collection or mass lesion/mass effect. Vascular: Negative for hyperdense vessel Skull: Negative Sinuses/Orbits: Negative Other: None IMPRESSION: Negative CT head Electronically Signed   By: Marlan Palauharles  Clark M.D.   On: 12/24/2017 13:40    Procedures Procedures (including critical care time)  Medications Ordered in ED Medications  SUMAtriptan (IMITREX) tablet 100 mg (100 mg Oral Given 12/24/17 1215)     Initial Impression / Assessment and Plan / ED Course  I have reviewed the triage vital signs and the nursing notes.  Pertinent labs & imaging results that were available during my care of the patient were reviewed by me and considered in my medical decision making (see chart for details).    Sent with a previous history of migraine  presents to the ED with change to her migraine along with photophobia.  She reports some blurry vision and having to focus while riding today at school.  She states her vision then returned to focusing.  Currently takes sumatriptan for her head pain. CT head ordered to r/o any acute abnormality.  CT head w/o contrast showed no acute abnormality.  She does have an appoint with her PCP scheduled for today at 230 which she is going to miss will have her reschedule this appointment to further advice with them.  Visual acuity performed in the ED with no significant changes.  Return precautions provided.  Final Clinical Impressions(s) / ED Diagnoses   Final diagnoses:  Bad headache    ED Discharge Orders    None       Claude MangesSoto, Brennan Litzinger, PA-C 12/24/17 1408    Melene PlanFloyd, Dan, DO 12/24/17 1427

## 2017-12-24 NOTE — Discharge Instructions (Addendum)
Please follow-up with your primary care physician for further evaluation of your migraines.  Continue to take your sumatriptan for pain relief.  Experience any vomiting, weakness, dizziness you may return to the ED for reevaluation.

## 2017-12-24 NOTE — ED Triage Notes (Signed)
Pt with hx of migraines reports while at school this AM after breakfast she became dizzy, had sharp pain on left side of head and had vision changes, blurriness.  Pt reports this does not resemble her "normal" migraines

## 2017-12-24 NOTE — ED Notes (Signed)
Patient verbalizes understanding of discharge instructions. Opportunity for questioning and answers were provided. Armband removed by staff, pt discharged from ED ambulatory to home.  

## 2017-12-24 NOTE — ED Notes (Signed)
Pt to go to ED for further eval 

## 2018-01-28 ENCOUNTER — Other Ambulatory Visit: Payer: Self-pay | Admitting: Obstetrics and Gynecology

## 2018-01-28 ENCOUNTER — Other Ambulatory Visit (HOSPITAL_COMMUNITY): Payer: Self-pay | Admitting: Obstetrics and Gynecology

## 2018-01-28 DIAGNOSIS — N979 Female infertility, unspecified: Secondary | ICD-10-CM

## 2018-01-31 ENCOUNTER — Ambulatory Visit (HOSPITAL_COMMUNITY)
Admission: RE | Admit: 2018-01-31 | Discharge: 2018-01-31 | Disposition: A | Discharge status: Home / Self Care | Payer: BC Managed Care – PPO | Source: Ambulatory Visit | Attending: Obstetrics and Gynecology | Admitting: Obstetrics and Gynecology

## 2018-01-31 DIAGNOSIS — N979 Female infertility, unspecified: Secondary | ICD-10-CM | POA: Diagnosis not present

## 2018-01-31 MED ORDER — IOPAMIDOL (ISOVUE-300) INJECTION 61%
30.0000 mL | Freq: Once | INTRAVENOUS | Status: AC | PRN
  Administered 2018-01-31: 10 mL

## 2018-07-31 ENCOUNTER — Ambulatory Visit
Admission: EM | Admit: 2018-07-31 | Discharge: 2018-07-31 | Disposition: A | Discharge status: Home / Self Care | Payer: BC Managed Care – PPO | Attending: Physician Assistant | Admitting: Physician Assistant

## 2018-07-31 ENCOUNTER — Other Ambulatory Visit: Payer: Self-pay

## 2018-07-31 DIAGNOSIS — N200 Calculus of kidney: Secondary | ICD-10-CM | POA: Diagnosis not present

## 2018-07-31 LAB — POCT URINALYSIS DIP (MANUAL ENTRY)
Glucose, UA: 100 mg/dL — AB
Ketones, POC UA: NEGATIVE mg/dL
Leukocytes, UA: NEGATIVE
Nitrite, UA: NEGATIVE
Protein Ur, POC: >=300 mg/dL — AB
Spec Grav, UA: >=1.03 — AB (ref 1.010–1.025)
Urobilinogen, UA: 1 E.U./dL
pH, UA: 5.5 (ref 5.0–8.0)

## 2018-07-31 MED ORDER — KETOROLAC TROMETHAMINE 10 MG PO TABS: 10.0000 mg | ORAL_TABLET | Freq: Four times a day (QID) | ORAL | 0 refills | Status: DC | PRN

## 2018-07-31 MED ORDER — KETOROLAC TROMETHAMINE 30 MG/ML IJ SOLN
30.0000 mg | Freq: Once | INTRAMUSCULAR | Status: AC
  Administered 2018-07-31: 30 mg via INTRAMUSCULAR

## 2018-07-31 MED ORDER — PROMETHAZINE HCL 25 MG PO TABS: 25.0000 mg | ORAL_TABLET | Freq: Four times a day (QID) | ORAL | 0 refills | Status: DC | PRN

## 2018-07-31 MED ORDER — HYDROCODONE-ACETAMINOPHEN 5-325 MG PO TABS: 1.0000 | ORAL_TABLET | Freq: Four times a day (QID) | ORAL | 0 refills | Status: DC | PRN

## 2018-07-31 MED ORDER — METOCLOPRAMIDE HCL 5 MG/ML IJ SOLN: 10.0000 mg | Freq: Once | INTRAMUSCULAR | Status: DC

## 2018-07-31 NOTE — ED Provider Notes (Signed)
[name]    CSN: [id number] Arrival date & time: 07/31/18  1924     History   Chief Complaint Chief Complaint  Patient presents with  . Flank Pain    HPI Angelica Kim is a 28 y.o. female.   28 year old female comes in for 2 day history of left flank pain. States significant worsening today with vomiting during significant pain. Denies fever, chills, night sweats. Left flank pain can radiate to LLQ/suprapubic abdominal. Denies frequency, dysuria, hematuria. Denies vaginal discharge, itching, pain. LMP 07/21/2018. History of kidney stones, feels like the same.      Past Medical History:  Diagnosis Date  . Ankylosing spondylitis (HCC)   . Asthma   . Fibromyalgia   . Kidney stones   . Migraine   . Ovarian cyst     Patient Active Problem List   Diagnosis Date Noted  . Generalized anxiety disorder 04/07/2014  . Migraines 12/21/2013  . Low back pain 12/21/2013  . Cognitive changes 12/21/2013    Past Surgical History:  Procedure Laterality Date  . COLONOSCOPY    . ESOPHAGOGASTRODUODENOSCOPY ENDOSCOPY    . spinal tap    . WISDOM TOOTH EXTRACTION      OB History    Gravida      Para      Term      Preterm      AB      Living  0     SAB      TAB      Ectopic      Multiple      Live Births               Home Medications    Prior to Admission medications   Medication Sig Start Date End Date Taking? Authorizing Provider  albuterol (PROVENTIL HFA;VENTOLIN HFA) 108 (90 BASE) MCG/ACT inhaler Inhale 2 puffs into the lungs every 6 (six) hours as needed for wheezing or shortness of breath.    [provider]  ciprofloxacin (CIPRO) 500 MG tablet Take 1 tablet (500 mg total) by mouth 2 (two) times daily. Patient not taking: Reported on 03/21/2017 03/10/17   [name], [name], MD  fluticasone (FLOVENT DISKUS) 50 MCG/BLIST diskus inhaler Inhale 2 puffs into the lungs 2 (two) times daily.     [provider]   HYDROcodone-acetaminophen (NORCO/VICODIN) 5-325 MG tablet Take 1 tablet by mouth every 6 (six) hours as needed for severe pain. 07/31/18   [name],  V, PA-C  ibuprofen (ADVIL,MOTRIN) 200 MG tablet Take 800 mg by mouth every 6 (six) hours as needed for fever, headache, mild pain, moderate pain or cramping.    [provider]  ketorolac (TORADOL) 10 MG tablet Take 1 tablet (10 mg total) by mouth every 6 (six) hours as needed for severe pain. Max 40mg /day. Had toradol injection 30mg  today. 07/31/18   [name],  V, PA-C  mometasone (NASONEX) 50 MCG/ACT nasal spray Place 2 sprays into the nose 2 (two) times daily.     [provider]  oxyCODONE-acetaminophen (PERCOCET/ROXICET) 5-325 MG tablet Take 2 tablets by mouth every 4 (four) hours as needed. 03/21/17   [name], [name], MD  promethazine (PHENERGAN) 25 MG tablet Take 1 tablet (25 mg total) by mouth every 6 (six) hours as needed for nausea or vomiting. 07/31/18   [name],  V, PA-C  sulfamethoxazole-trimethoprim (BACTRIM DS,SEPTRA DS) 800-160 MG tablet Take 2 tablets by mouth 2 (two) times daily. 03/21/17   [name], [name], MD  SUMAtriptan (IMITREX) 100 MG tablet May repeat in 2 hours if headache persists or recurs. 02/16/15   [name], MD  tamsulosin (FLOMAX) 0.4 MG CAPS capsule Take 1 capsule (0.4 mg total) by mouth daily. 03/21/17   [name], MD  metoCLOPramide (REGLAN) 10 MG tablet Take 1 tablet (10 mg total) by mouth every 6 (six) hours. 03/22/17 07/31/18  [name], [name], PA-C    Family History Family History  Problem Relation Age of Onset  . Arthritis Mother   . Hypertension Maternal Grandmother   . Thyroid disease Maternal Grandmother   . Kidney Stones Maternal Grandfather     Social History Social History   Tobacco Use  . Smoking status: Never Smoker  . Smokeless tobacco: Never Used  Substance Use Topics  . Alcohol use: Yes    Alcohol/week: 0.0 standard drinks    Comment: occassionally wine  every other weekend  . Drug use: No      Allergies   Amoxicillin and Zofran [ondansetron hcl]   Review of Systems Review of Systems   Physical Exam Triage Vital Signs ED Triage Vitals  Enc Vitals Group     BP      Pulse      Resp      Temp      Temp src      SpO2      Weight      Height      Head Circumference      Peak Flow      Pain Score      Pain Loc      Pain Edu?      Excl. in [place]?    No data found.  Updated Vital Signs BP 117/84 (BP Location: Left Arm)   Pulse 99   Temp 98.6 F (37 C) (Oral)   Resp 18   LMP 07/22/2018   SpO2 96%   Physical Exam Constitutional:      Appearance: She is well-developed. She is not ill-appearing, toxic-appearing or diaphoretic.     Comments: Patient actively vomiting, squatting due to pain.   HENT:     Head: Normocephalic and atraumatic.  Eyes:     Conjunctiva/sclera: Conjunctivae normal.     Pupils: Pupils are equal, round, and reactive to light.  Cardiovascular:     Rate and Rhythm: Normal rate and regular rhythm.     Heart sounds: Normal heart sounds. No murmur. No friction rub. No gallop.   Pulmonary:     Effort: Pulmonary effort is normal.     Breath sounds: Normal breath sounds. No wheezing or rales.  Abdominal:     General: Bowel sounds are normal.     Palpations: Abdomen is soft.     Tenderness: There is no abdominal tenderness. There is left CVA tenderness. There is no right CVA tenderness, guarding or rebound.  Skin:    General: Skin is warm and dry.  Neurological:     Mental Status: She is alert and oriented to person, place, and time.  Psychiatric:        Behavior: Behavior normal.        Judgment: Judgment normal.      UC Treatments / Results  Labs (all labs ordered are listed, but only abnormal results are displayed) Labs Reviewed  POCT URINALYSIS DIP (MANUAL ENTRY) - Abnormal; Notable for the following components:      Result Value   Color, UA brown (*)    Clarity, UA cloudy (*)  Glucose, UA =100 (*)    Bilirubin, UA small  (*)    Spec Grav, UA >=1.030 (*)    Blood, UA large (*)    Protein Ur, POC >=300 (*)    All other components within normal limits    EKG   Radiology No results found.  Procedures Procedures (including critical care time)  Medications Ordered in UC Medications  ketorolac (TORADOL) 30 MG/ML injection 30 mg (30 mg Intramuscular Given 07/31/18 1940)    Initial Impression / Assessment and Plan / UC Course  I have reviewed the triage vital signs and the nursing notes.  Pertinent labs & imaging results that were available during my care of the patient were reviewed by me and considered in my medical decision making (see chart for details).    Patient in pain, holding left flank and squatting. Actively vomiting due to pain. Has had toradol injection in the past with good relief, will provide toradol prior to exam.   Urine without infection. Patient's symptoms much relieved by toradol injection. Discussed most likely symptoms due to renal stone. Patient has residual flomax at home from prior stones, took one today, will have patient continue. Patient states toradol works best for pain vs narcotics. Will call in toradol PO for patient. Phenergan for nausea/vomiting. Will provide norco as needed. Given patient with history of kidney stones with resulting hydronephritis, if symptoms not improving/worsening, to go to the ED for further evaluation needed.   Final Clinical Impressions(s) / UC Diagnoses   Final diagnoses:  Renal stone   ED Prescriptions    Medication Sig Dispense Auth. Provider   ketorolac (TORADOL) 10 MG tablet Take 1 tablet (10 mg total) by mouth every 6 (six) hours as needed for severe pain. Max 40mg /day. Had toradol injection 30mg  today. 20 tablet ,  V, PA-C   promethazine (PHENERGAN) 25 MG tablet Take 1 tablet (25 mg total) by mouth every 6 (six) hours as needed for nausea or vomiting. 15 tablet ,  V, PA-C   HYDROcodone-acetaminophen (NORCO/VICODIN) 5-325 MG  tablet Take 1 tablet by mouth every 6 (six) hours as needed for severe pain. 10 tablet Belinda Fisher,  V, PA-C     Controlled Substance Prescriptions [place] Controlled Substance Registry consulted? Yes, I have consulted the [place] Controlled Substances Registry for this patient, and feel the risk/benefit ratio today is favorable for proceeding with this prescription for a controlled substance.   Belinda Fisher,  V, PA-C 07/31/18 2034

## 2018-07-31 NOTE — Discharge Instructions (Signed)
Urine negative for infection. Continue flomax. Ketorolac tablets as needed. Phenergan as needed. Keep hydrated, urine should be clear to pale yellow in color. Follow up with ED for further evaluation if symptoms not improving.

## 2018-07-31 NOTE — ED Triage Notes (Signed)
Pt c/o lt flank pain, hx of kidney stones and states feels the same. C/o n/v

## 2018-12-17 ENCOUNTER — Ambulatory Visit
Admission: EM | Admit: 2018-12-17 | Discharge: 2018-12-17 | Disposition: A | Discharge status: Home / Self Care | Payer: BC Managed Care – PPO | Source: Home / Self Care

## 2018-12-17 ENCOUNTER — Inpatient Hospital Stay (HOSPITAL_COMMUNITY): Payer: BC Managed Care – PPO

## 2018-12-17 ENCOUNTER — Inpatient Hospital Stay (HOSPITAL_COMMUNITY)
Admission: AD | Admit: 2018-12-17 | Discharge: 2018-12-17 | Disposition: A | Discharge status: Home / Self Care | Payer: BC Managed Care – PPO | Attending: Obstetrics and Gynecology | Admitting: Obstetrics and Gynecology

## 2018-12-17 ENCOUNTER — Other Ambulatory Visit: Payer: Self-pay

## 2018-12-17 ENCOUNTER — Encounter (HOSPITAL_COMMUNITY): Payer: Self-pay | Admitting: *Deleted

## 2018-12-17 DIAGNOSIS — Z79899 Other long term (current) drug therapy: Secondary | ICD-10-CM | POA: Insufficient documentation

## 2018-12-17 DIAGNOSIS — R1012 Left upper quadrant pain: Secondary | ICD-10-CM

## 2018-12-17 DIAGNOSIS — O99891 Other specified diseases and conditions complicating pregnancy: Secondary | ICD-10-CM | POA: Insufficient documentation

## 2018-12-17 DIAGNOSIS — R102 Pelvic and perineal pain: Secondary | ICD-10-CM | POA: Diagnosis not present

## 2018-12-17 DIAGNOSIS — R1032 Left lower quadrant pain: Secondary | ICD-10-CM | POA: Diagnosis not present

## 2018-12-17 DIAGNOSIS — D72829 Elevated white blood cell count, unspecified: Secondary | ICD-10-CM | POA: Insufficient documentation

## 2018-12-17 DIAGNOSIS — Z3201 Encounter for pregnancy test, result positive: Secondary | ICD-10-CM

## 2018-12-17 DIAGNOSIS — O99511 Diseases of the respiratory system complicating pregnancy, first trimester: Secondary | ICD-10-CM | POA: Insufficient documentation

## 2018-12-17 DIAGNOSIS — Z87442 Personal history of urinary calculi: Secondary | ICD-10-CM | POA: Diagnosis not present

## 2018-12-17 DIAGNOSIS — O26891 Other specified pregnancy related conditions, first trimester: Secondary | ICD-10-CM | POA: Diagnosis not present

## 2018-12-17 DIAGNOSIS — Z3A01 Less than 8 weeks gestation of pregnancy: Secondary | ICD-10-CM | POA: Diagnosis not present

## 2018-12-17 DIAGNOSIS — O99111 Other diseases of the blood and blood-forming organs and certain disorders involving the immune mechanism complicating pregnancy, first trimester: Secondary | ICD-10-CM | POA: Insufficient documentation

## 2018-12-17 DIAGNOSIS — O26899 Other specified pregnancy related conditions, unspecified trimester: Secondary | ICD-10-CM

## 2018-12-17 DIAGNOSIS — O3680X0 Pregnancy with inconclusive fetal viability, not applicable or unspecified: Secondary | ICD-10-CM | POA: Diagnosis present

## 2018-12-17 DIAGNOSIS — Z888 Allergy status to other drugs, medicaments and biological substances status: Secondary | ICD-10-CM | POA: Insufficient documentation

## 2018-12-17 DIAGNOSIS — J45909 Unspecified asthma, uncomplicated: Secondary | ICD-10-CM | POA: Insufficient documentation

## 2018-12-17 DIAGNOSIS — Z880 Allergy status to penicillin: Secondary | ICD-10-CM | POA: Diagnosis not present

## 2018-12-17 DIAGNOSIS — Z7951 Long term (current) use of inhaled steroids: Secondary | ICD-10-CM | POA: Insufficient documentation

## 2018-12-17 DIAGNOSIS — Z862 Personal history of diseases of the blood and blood-forming organs and certain disorders involving the immune mechanism: Secondary | ICD-10-CM

## 2018-12-17 DIAGNOSIS — R109 Unspecified abdominal pain: Secondary | ICD-10-CM

## 2018-12-17 LAB — POCT URINALYSIS DIP (MANUAL ENTRY)
Bilirubin, UA: NEGATIVE
Blood, UA: NEGATIVE
Glucose, UA: NEGATIVE mg/dL
Ketones, POC UA: NEGATIVE mg/dL
Leukocytes, UA: NEGATIVE
Nitrite, UA: NEGATIVE
Protein Ur, POC: NEGATIVE mg/dL
Spec Grav, UA: >=1.03 — AB (ref 1.010–1.025)
Urobilinogen, UA: 1 E.U./dL
pH, UA: 6.5 (ref 5.0–8.0)

## 2018-12-17 LAB — CBC
HCT: 39.1 % (ref 36.0–46.0)
Hemoglobin: 13.9 g/dL (ref 12.0–15.0)
MCH: 30.6 pg (ref 26.0–34.0)
MCHC: 35.5 g/dL (ref 30.0–36.0)
MCV: 86.1 fL (ref 80.0–100.0)
Platelets: 258 10*3/uL (ref 150–400)
RBC: 4.54 MIL/uL (ref 3.87–5.11)
RDW: 12.6 % (ref 11.5–15.5)
WBC: 16.9 10*3/uL — ABNORMAL HIGH (ref 4.0–10.5)
nRBC: 0 % (ref 0.0–0.2)

## 2018-12-17 LAB — WET PREP, GENITAL
Clue Cells Wet Prep HPF POC: NONE SEEN
Sperm: NONE SEEN
Trich, Wet Prep: NONE SEEN
Yeast Wet Prep HPF POC: NONE SEEN

## 2018-12-17 LAB — HCG, QUANTITATIVE, PREGNANCY: hCG, Beta Chain, Quant, S: 386 m[IU]/mL — ABNORMAL HIGH (ref <5)

## 2018-12-17 LAB — HIV ANTIBODY (ROUTINE TESTING W REFLEX): HIV Screen 4th Generation wRfx: NONREACTIVE

## 2018-12-17 NOTE — ED Provider Notes (Signed)
28 year old female who is 3-[redacted] weeks pregnant comes in with acute onset of left lower quadrant pain, left upper quadrant pain, left flank pain.  Has cramping sensation to the abdomen with sharp pain to the flank area.  Denies fever, chills, body aches.  Denies nausea, vomiting, diarrhea.  Denies vaginal bleeding, vaginal discharge, urinary difficulty.  Last BM yesterday.  She has history of kidney stones, and feels similiar.  Take Tylenol without relief.  On exam, patient looks uncomfortable, tachycardic.  Lungs clear to auscultation bilaterally without adventitious lung sounds.  Abdomen soft, +BS, tenderness to palpation of LLQ and LUQ without guarding or rebound. + Left CVA tenderness.   Urine dipstick without blood. Given patient is pregnant with abdominal pain/flank pain, discharged to [facility] ED for further evaluation needed.  Patient with HCG measurement for confirmation of pregnancy. 12/12/2018 HCG 55, 12/15/2018, HCG 122.    Ok Edwards, PA-C 12/17/18 [phone]

## 2018-12-17 NOTE — MAU Note (Signed)
Found out a wk ago I am pregnant. LMP 11/16/18. Started having pain L flank about 1700. Pain then started coming around to L upper quad of abdomen. Hx kidney stone and this feels like how they start and wanted to catch it early. Denies vag bleeding or d/c. No ob u/s yet.

## 2018-12-17 NOTE — MAU Provider Note (Signed)
Chief Complaint: Flank Pain and Abdominal Pain   First Provider Initiated Contact with Patient 12/17/18 2048        SUBJECTIVE HPI: Angelica HiresMaria Kim is a 28 y.o. G1P0 at 433w3d by LMP who presents to maternity admissions reporting pain in left flank which radiates to front.  Started at 5pm  No bleeding. . She denies vaginal bleeding, vaginal itching/burning, urinary symptoms, h/a, dizziness, n/v, or fever/chills.    Has been getting HCG levels followed at [facility] due to having PCOS.   12/12/18:  55 12/15/18:  122  Flank Pain This is a new problem. The current episode started today. The problem is unchanged. The quality of the pain is described as aching and cramping. Radiates to: upper Left abdomen. The pain is moderate. The pain is the same all the time. Stiffness is present all day. Associated symptoms include abdominal pain. Pertinent negatives include no chest pain, dysuria, fever, headaches, paresis, pelvic pain or weakness. She has tried nothing for the symptoms.  Abdominal Pain This is a new problem. The current episode started today. The problem occurs constantly. The problem has been unchanged. The pain is located in the left flank and LUQ. The pain is moderate. The quality of the pain is cramping and aching. Pertinent negatives include no constipation, diarrhea, dysuria, fever, headaches, myalgias, nausea or vomiting. Nothing aggravates the pain. The pain is relieved by nothing. She has tried nothing for the symptoms.   RN Note: Found out a wk ago I am pregnant. LMP 11/16/18. Started having pain L flank about 1700. Pain then started coming around to L upper quad of abdomen. Hx kidney stone and this feels like how they start and wanted to catch it early. Denies vag bleeding or d/c. No ob u/s yet  Past Medical History:  Diagnosis Date  . Ankylosing spondylitis (HCC)   . Asthma   . Fibromyalgia   . Kidney stones   . Migraine   . Ovarian cyst    Past Surgical History:  Procedure Laterality  Date  . COLONOSCOPY    . ESOPHAGOGASTRODUODENOSCOPY ENDOSCOPY    . spinal tap    . WISDOM TOOTH EXTRACTION     Social History   Socioeconomic History  . Marital status: Single    Spouse name: Not on file  . Number of children: 0  . Years of education: Masters  . Highest education level: Not on file  Occupational History    [employer] [employer]  Social Needs  . Financial resource strain: Not on file  . Food insecurity    Worry: Not on file    Inability: Not on file  . Transportation needs    Medical: Not on file    Non-medical: Not on file  Tobacco Use  . Smoking status: Never Smoker  . Smokeless tobacco: Never Used  Substance and Sexual Activity  . Alcohol use: Yes    Alcohol/week: 0.0 standard drinks    Comment: occassionally wine  every other weekend  . Drug use: No  . Sexual activity: Not Currently  Lifestyle  . Physical activity    Days per week: Not on file    Minutes per session: Not on file  . Stress: Not on file  Relationships  . Social Musicianconnections    Talks on phone: Not on file    Gets together: Not on file    Attends religious service: Not on file    Active member of club or organization: Not on file    Attends  meetings of clubs or organizations: Not on file    Relationship status: Not on file  . Intimate partner violence    Fear of current or ex partner: Not on file    Emotionally abused: Not on file    Physically abused: Not on file    Forced sexual activity: Not on file  Other Topics Concern  . Not on file  Social History Narrative   Patient drinks caffeine moderate    Patient is single   Patient lives alone    Patient has no children    Patient has a Master's degree    Caffeine: soda (3 12oz per day)   No current facility-administered medications on file prior to encounter.    Current Outpatient Medications on File Prior to Encounter  Medication Sig Dispense Refill  . albuterol (PROVENTIL HFA;VENTOLIN HFA) 108 (90 BASE) MCG/ACT  inhaler Inhale 2 puffs into the lungs every 6 (six) hours as needed for wheezing or shortness of breath.    . fluticasone (FLOVENT DISKUS) 50 MCG/BLIST diskus inhaler Inhale 2 puffs into the lungs 2 (two) times daily.     . mometasone (NASONEX) 50 MCG/ACT nasal spray Place 2 sprays into the nose 2 (two) times daily.     . [DISCONTINUED] metoCLOPramide (REGLAN) 10 MG tablet Take 1 tablet (10 mg total) by mouth every 6 (six) hours. 30 tablet 0   Allergies  Allergen Reactions  . Amoxicillin Hives, Shortness Of Breath and Swelling    Swelling of hands, face, and lip Has patient had a PCN reaction causing immediate rash, facial/tongue/throat swelling, SOB or lightheadedness with hypotension: Unknown Has patient had a PCN reaction causing severe rash involving mucus membranes or skin necrosis: Yes Has patient had a PCN reaction that required hospitalization: No Has patient had a PCN reaction occurring within the last 10 years: No If all of the above answers are "NO", then may proceed with Cephalosporin use.   Marland Kitchen Zofran [Ondansetron Hcl] Nausea Only    I have reviewed patient's Past Medical Hx, Surgical Hx, Family Hx, Social Hx, medications and allergies.   ROS:  Review of Systems  Constitutional: Negative for fever.  Cardiovascular: Negative for chest pain.  Gastrointestinal: Positive for abdominal pain. Negative for constipation, diarrhea, nausea and vomiting.  Genitourinary: Positive for flank pain. Negative for dysuria and pelvic pain.  Musculoskeletal: Negative for myalgias.  Neurological: Negative for weakness and headaches.   Review of Systems  Other systems negative   Physical Exam  Physical Exam Patient Vitals for the past 24 hrs:  BP Temp Pulse Resp Height Weight  12/17/18 2036 130/73 98.3 F (36.8 C) (!) 119 18 5' (1.524 m) 73.9 kg   Constitutional: Well-developed, well-nourished female in no acute distress.  Cardiovascular: normal rate Respiratory: normal effort GI: Abd  soft, non-tender. Pos BS x 4 MS: Extremities nontender, no edema, normal ROM Neurologic: Alert and oriented x 4.  GU: Neg CVAT.  PELVIC EXAM:  Bimanual exam: Cervix 0/long/high, firm, anterior, neg CMT, uterus nontender, nonenlarged, adnexa without tenderness, enlargement, or mass  LAB RESULTS Results for orders placed or performed during the hospital encounter of 12/17/18 (from the past 24 hour(s))  CBC     Status: Abnormal   Collection Time: 12/17/18  8:51 PM  Result Value Ref Range   WBC 16.9 (H) 4.0 - 10.5 K/uL   RBC 4.54 3.87 - 5.11 MIL/uL   Hemoglobin 13.9 12.0 - 15.0 g/dL   HCT 39.1 36.0 - 46.0 %   MCV  86.1 80.0 - 100.0 fL   MCH 30.6 26.0 - 34.0 pg   MCHC 35.5 30.0 - 36.0 g/dL   RDW 40.9 73.5 - 32.9 %   Platelets 258 150 - 400 K/uL   nRBC 0.0 0.0 - 0.2 %  hCG, quantitative, pregnancy     Status: Abnormal   Collection Time: 12/17/18  8:51 PM  Result Value Ref Range   hCG, Beta Chain, Quant, S 386 (H) <5 mIU/mL  HIV Antibody (routine testing w rflx)     Status: None   Collection Time: 12/17/18  8:51 PM  Result Value Ref Range   HIV Screen 4th Generation wRfx NON REACTIVE NON REACTIVE  Wet prep, genital     Status: Abnormal   Collection Time: 12/17/18  9:29 PM   Specimen: PATH Cytology Cervicovaginal Ancillary Only  Result Value Ref Range   Yeast Wet Prep HPF POC NONE SEEN NONE SEEN   Trich, Wet Prep NONE SEEN NONE SEEN   Clue Cells Wet Prep HPF POC NONE SEEN NONE SEEN   WBC, Wet Prep HPF POC FEW (A) NONE SEEN   Sperm NONE SEEN     IMAGING US Renal  Result Date: 12/17/2018 CLINICAL DATA:  Left flank pain EXAM: RENAL / URINARY TRACT ULTRASOUND COMPLETE COMPARISON:  None. FINDINGS: Right Kidney: Renal measurements: 11.3 x 3.0 x 5.4 cm = volume: 158 mL . Echogenicity within normal limits. No mass or hydronephrosis visualized. Left Kidney: Renal measurements: 11.3 x 4.8 x 3.8 cm = volume: 106 mL. Echogenicity within normal limits. No mass or hydronephrosis visualized.  Bladder: Appears normal for degree of bladder distention. Other: None. IMPRESSION: Normal study.  No visible stones or hydronephrosis. Electronically Signed   By: Charlett Nose M.D.   On: 12/17/2018 22:18   US Ob Less Than 14 Weeks With Ob Transvaginal  Result Date: 12/17/2018 CLINICAL DATA:  Left flank pain for 3 hours EXAM: OBSTETRIC <14 WK [place] AND TRANSVAGINAL OB US TECHNIQUE: Both transabdominal and transvaginal ultrasound examinations were performed for complete evaluation of the gestation as well as the maternal uterus, adnexal regions, and pelvic cul-de-sac. Transvaginal technique was performed to assess early pregnancy. COMPARISON:  CT urogram 08/04/2018 FINDINGS: LMP: 11/16/2018 GA by LMP: 4 w  3 d Intrauterine gestational sac: None Maternal uterus/adnexae: Thick-walled, centrally hypoechoic/anechoic cystic structures present in both ovaries with peripheral color Doppler flow compatible with bilateral corpus luteum cysts measuring 1.9 cm in the right ovary and 1.4 cm in the left ovary. No concerning adnexal lesions. Appearance of the maternal uterus is unremarkable. IMPRESSION: No intrauterine pregnancy visualized. Differential considerations would include early intrauterine pregnancy too early to visualize, spontaneous abortion, or occult ectopic pregnancy. Recommend close clinical followup and serial quantitative beta HCGs and ultrasounds. Electronically Signed   By: Kreg Shropshire M.D.   On: 12/17/2018 22:22     MAU Management/MDM: Ordered usual first trimester r/o ectopic labs.   Pelvic exam and cultures done Will check baseline Ultrasound to rule out ectopic. Reviewed results Quants have doubled nicely, but pregnancy is too early to see.  Cannot rule out ectopic Pt to contact office in am to see if they want her to repeat HCG here Friday night or there in office  Renal US is normal  Discussed with neg [place] and no hematuria, this may not be stones Discussed elevated WBC.  Patient states has  had elevated WBCs for many years, unknown reason.  Urine is clear, no indication of infection.   This bleeding/pain can  represent a normal pregnancy with bleeding, spontaneous abortion or even an ectopic which can be life-threatening.  The process as listed above helps to determine which of these is present.    ASSESSMENT Pregnancy at [redacted]w[redacted]d by LMP Pregnancy of unknown location with appropriate rise in HCG Left flank pain History of kidney stones Chronic leukocytosis Clear urine  PLAN Discharge home Will repeat  Ultrasound as per scheduled at [facility] (pt to call them in am to notify she was here)  Ectopic precautions Tylenol for pain  Pt stable at time of discharge. Encouraged to return here or to other Urgent Care/ED if she develops worsening of symptoms, increase in pain, fever, or other concerning symptoms.    Wynelle Bourgeois CNM, MSN Certified Nurse-Midwife 12/17/2018  8:48 PM

## 2018-12-17 NOTE — Discharge Instructions (Signed)
Abdominal Pain During Pregnancy ° °Abdominal pain is common during pregnancy, and has many possible causes. Some causes are more serious than others, and sometimes the cause is not known. Abdominal pain can be a sign that labor is starting. It can also be caused by normal growth and stretching of muscles and ligaments during pregnancy. Always tell your health care provider if you have any abdominal pain. °Follow these instructions at home: °· Do not have sex or put anything in your vagina until your pain goes away completely. °· Get plenty of rest until your pain improves. °· Drink enough fluid to keep your urine pale yellow. °· Take over-the-counter and prescription medicines only as told by your health care provider. °· Keep all follow-up visits as told by your health care provider. This is important. °Contact a health care provider if: °· Your pain continues or gets worse after resting. °· You have lower abdominal pain that: °? Comes and goes at regular intervals. °? Spreads to your back. °? Is similar to menstrual cramps. °· You have pain or burning when you urinate. °Get help right away if: °· You have a fever or chills. °· You have vaginal bleeding. °· You are leaking fluid from your vagina. °· You are passing tissue from your vagina. °· You have vomiting or diarrhea that lasts for more than 24 hours. °· Your baby is moving less than usual. °· You feel very weak or faint. °· You have shortness of breath. °· You develop severe pain in your upper abdomen. °Summary °· Abdominal pain is common during pregnancy, and has many possible causes. °· If you experience abdominal pain during pregnancy, tell your health care provider right away. °· Follow your health care provider's home care instructions and keep all follow-up visits as directed. °This information is not intended to replace advice given to you by your health care provider. Make sure you discuss any questions you have with your health care  provider. °Document Released: 01/15/2005 Document Revised: 05/05/2018 Document Reviewed: 04/19/2016 °Elsevier Patient Education © 2020 [employer] ° °

## 2018-12-17 NOTE — ED Triage Notes (Signed)
Pt c/o lt flank pain radiating down to LLQ for the past few hours. Pt states hx of kidney stone and this feels the same. Pt states she is [redacted]wks pregnant, denies vaginal discharge or urinary difficulty.

## 2018-12-17 NOTE — Discharge Instructions (Signed)
Given pregnancy, with flank pain, abdominal pain, patient discharged to the [facility] ED for further evaluation needed.   No vaginal bleeding, nausea/vomiting.

## 2018-12-19 LAB — GC/CHLAMYDIA PROBE AMP (~~LOC~~) NOT AT ARMC
Chlamydia: NEGATIVE
Comment: NEGATIVE
Comment: NORMAL
Neisseria Gonorrhea: NEGATIVE

## 2019-01-05 ENCOUNTER — Encounter (HOSPITAL_COMMUNITY): Payer: Self-pay

## 2019-01-05 LAB — OB RESULTS CONSOLE ABO/RH: RH Type: POSITIVE

## 2019-01-05 LAB — OB RESULTS CONSOLE RPR: RPR: NONREACTIVE

## 2019-01-05 LAB — OB RESULTS CONSOLE GC/CHLAMYDIA
Chlamydia: NEGATIVE
Gonorrhea: NEGATIVE

## 2019-01-05 LAB — OB RESULTS CONSOLE ANTIBODY SCREEN: Antibody Screen: NEGATIVE

## 2019-01-05 LAB — OB RESULTS CONSOLE HIV ANTIBODY (ROUTINE TESTING): HIV: NONREACTIVE

## 2019-01-05 LAB — OB RESULTS CONSOLE RUBELLA ANTIBODY, IGM: Rubella: IMMUNE

## 2019-01-05 LAB — OB RESULTS CONSOLE HEPATITIS B SURFACE ANTIGEN: Hepatitis B Surface Ag: NEGATIVE

## 2019-01-12 ENCOUNTER — Other Ambulatory Visit: Payer: Self-pay

## 2019-01-12 ENCOUNTER — Encounter (HOSPITAL_COMMUNITY): Payer: Self-pay | Admitting: Obstetrics and Gynecology

## 2019-01-12 ENCOUNTER — Inpatient Hospital Stay (HOSPITAL_COMMUNITY)
Admission: AD | Admit: 2019-01-12 | Discharge: 2019-01-12 | Disposition: A | Discharge status: Home / Self Care | Payer: BC Managed Care – PPO | Attending: Obstetrics and Gynecology | Admitting: Obstetrics and Gynecology

## 2019-01-12 DIAGNOSIS — Z79899 Other long term (current) drug therapy: Secondary | ICD-10-CM | POA: Diagnosis not present

## 2019-01-12 DIAGNOSIS — O26891 Other specified pregnancy related conditions, first trimester: Secondary | ICD-10-CM | POA: Diagnosis not present

## 2019-01-12 DIAGNOSIS — O99511 Diseases of the respiratory system complicating pregnancy, first trimester: Secondary | ICD-10-CM | POA: Diagnosis not present

## 2019-01-12 DIAGNOSIS — J45909 Unspecified asthma, uncomplicated: Secondary | ICD-10-CM | POA: Insufficient documentation

## 2019-01-12 DIAGNOSIS — R109 Unspecified abdominal pain: Secondary | ICD-10-CM | POA: Diagnosis not present

## 2019-01-12 DIAGNOSIS — M797 Fibromyalgia: Secondary | ICD-10-CM | POA: Diagnosis not present

## 2019-01-12 DIAGNOSIS — Z3A08 8 weeks gestation of pregnancy: Secondary | ICD-10-CM | POA: Diagnosis not present

## 2019-01-12 LAB — URINALYSIS, ROUTINE W REFLEX MICROSCOPIC
Bilirubin Urine: NEGATIVE
Glucose, UA: NEGATIVE mg/dL
Hgb urine dipstick: NEGATIVE
Ketones, ur: NEGATIVE mg/dL
Leukocytes,Ua: NEGATIVE
Nitrite: NEGATIVE
Protein, ur: NEGATIVE mg/dL
Specific Gravity, Urine: 1.021 (ref 1.005–1.030)
pH: 6 (ref 5.0–8.0)

## 2019-01-12 NOTE — MAU Note (Addendum)
Pt reporting sharp pain on lower left  that woke her up last night. Was very intermittent at first, but now more often. Denies VB, discharge, nausea or vomiting. Had appointment last week w/OB and has confirmed IUP. Pt tried Tylenol but it did not help.

## 2019-01-12 NOTE — Discharge Instructions (Signed)
Abdominal Pain During Pregnancy ° °Belly (abdominal) pain is common during pregnancy. There are many possible causes. Most of the time, it is not a serious problem. Other times, it can be a sign that something is wrong with the pregnancy. Always tell your doctor if you have belly pain. °Follow these instructions at home: °· Do not have sex or put anything in your vagina until your pain goes away completely. °· Get plenty of rest until your pain gets better. °· Drink enough fluid to keep your pee (urine) pale yellow. °· Take over-the-counter and prescription medicines only as told by your doctor. °· Keep all follow-up visits as told by your doctor. This is important. °Contact a doctor if: °· Your pain continues or gets worse after resting. °· You have lower belly pain that: °? Comes and goes at regular times. °? Spreads to your back. °? Feels like menstrual cramps. °· You have pain or burning when you pee (urinate). °Get help right away if: °· You have a fever or chills. °· You have vaginal bleeding. °· You are leaking fluid from your vagina. °· You are passing tissue from your vagina. °· You throw up (vomit) for more than 24 hours. °· You have watery poop (diarrhea) for more than 24 hours. °· Your baby is moving less than usual. °· You feel very weak or faint. °· You have shortness of breath. °· You have very bad pain in your upper belly. °Summary °· Belly (abdominal) pain is common during pregnancy. There are many possible causes. °· If you have belly pain during pregnancy, tell your doctor right away. °· Keep all follow-up visits as told by your doctor. This is important. °This information is not intended to replace advice given to you by your health care provider. Make sure you discuss any questions you have with your health care provider. °Document Released: 01/03/2009 Document Revised: 05/05/2018 Document Reviewed: 04/19/2016 °Elsevier Patient Education © 2020 [employer] ° °

## 2019-01-12 NOTE — MAU Provider Note (Signed)
Chief Complaint: Abdominal Pain   First Provider Initiated Contact with Patient 01/12/19 1840     SUBJECTIVE HPI: Angelica Kim is a 28 y.o. G1P0 at [redacted]w[redacted]d who presents to Maternity Admissions reporting abdominal pain. Symptoms started this morning around 3 am while she was sleeping. Reports intermittent sharp pains in her LLQ. Pain occurs about 4 times per hour and lasts about 30 seconds at a time. Denies fever/chills, n/v/d, constipation, dysuria, vaginal discharge, or vaginal bleeding. Had an ultrasound in the office ([facility]) last week that confirmed IUP.   Location: abdomen Quality: sharp Severity: 7/10 on pain scale Duration: 1 day Timing: 3-4 times per hour Modifying factors: none Associated signs and symptoms: none  Past Medical History:  Diagnosis Date  . Ankylosing spondylitis ([place])   . Asthma   . Fibromyalgia   . Kidney stones   . Migraine   . Ovarian cyst    OB History  Gravida Para Term Preterm AB Living  1         0  SAB TAB Ectopic Multiple Live Births               # Outcome Date GA Lbr Len/2nd Weight Sex Delivery Anes PTL Lv  1 Current            Past Surgical History:  Procedure Laterality Date  . COLONOSCOPY    . ESOPHAGOGASTRODUODENOSCOPY ENDOSCOPY    . spinal tap    . WISDOM TOOTH EXTRACTION     Social History   Socioeconomic History  . Marital status: Single    Spouse name: Not on file  . Number of children: 0  . Years of education: Masters  . Highest education level: Not on file  Occupational History    Employer: [employer]  Tobacco Use  . Smoking status: Never Smoker  . Smokeless tobacco: Never Used  Substance and Sexual Activity  . Alcohol use: Yes    Alcohol/week: 0.0 standard drinks    Comment: occassionally wine  every other weekend  . Drug use: No  . Sexual activity: Not Currently  Other Topics Concern  . Not on file  Social History Narrative   Patient drinks caffeine moderate    Patient is single   Patient lives alone     Patient has no children    Patient has a Master's degree    Caffeine: soda (3 12oz per day)   Social Determinants of Health   Financial Resource Strain:   . Difficulty of Paying Living Expenses: Not on file  Food Insecurity:   . Worried About Charity fundraiser in the Last Year: Not on file  . Ran Out of Food in the Last Year: Not on file  Transportation Needs:   . Lack of Transportation (Medical): Not on file  . Lack of Transportation (Non-Medical): Not on file  Physical Activity:   . Days of Exercise per Week: Not on file  . Minutes of Exercise per Session: Not on file  Stress:   . Feeling of Stress : Not on file  Social Connections:   . Frequency of Communication with Friends and Family: Not on file  . Frequency of Social Gatherings with Friends and Family: Not on file  . Attends Religious Services: Not on file  . Active Member of Clubs or Organizations: Not on file  . Attends Archivist Meetings: Not on file  . Marital Status: Not on file  Intimate Partner Violence:   . Fear of Current or  Ex-Partner: Not on file  . Emotionally Abused: Not on file  . Physically Abused: Not on file  . Sexually Abused: Not on file   Family History  Problem Relation Age of Onset  . Arthritis Mother   . Hypertension Maternal Grandmother   . Thyroid disease Maternal Grandmother   . Kidney Stones Maternal Grandfather    No current facility-administered medications on file prior to encounter.   Current Outpatient Medications on File Prior to Encounter  Medication Sig Dispense Refill  . acetaminophen (TYLENOL) 500 MG tablet Take 500 mg by mouth every 6 (six) hours as needed for moderate pain.    . Prenatal Vit-Fe Fumarate-FA (PRENATAL MULTIVITAMIN) TABS tablet Take 1 tablet by mouth daily at 12 noon.    Marland Kitchen albuterol (PROVENTIL HFA;VENTOLIN HFA) 108 (90 BASE) MCG/ACT inhaler Inhale 2 puffs into the lungs every 6 (six) hours as needed for wheezing or shortness of breath.    .  fluticasone (FLOVENT DISKUS) 50 MCG/BLIST diskus inhaler Inhale 2 puffs into the lungs 2 (two) times daily.     . mometasone (NASONEX) 50 MCG/ACT nasal spray Place 2 sprays into the nose 2 (two) times daily.     . [DISCONTINUED] metoCLOPramide (REGLAN) 10 MG tablet Take 1 tablet (10 mg total) by mouth every 6 (six) hours. 30 tablet 0   Allergies  Allergen Reactions  . Amoxicillin Hives, Shortness Of Breath and Swelling    Swelling of hands, face, and lip Has patient had a PCN reaction causing immediate rash, facial/tongue/throat swelling, SOB or lightheadedness with hypotension: Unknown Has patient had a PCN reaction causing severe rash involving mucus membranes or skin necrosis: Yes Has patient had a PCN reaction that required hospitalization: No Has patient had a PCN reaction occurring within the last 10 years: No If all of the above answers are "NO", then may proceed with Cephalosporin use.   Marland Kitchen Zofran [Ondansetron Hcl] Nausea Only    I have reviewed patient's Past Medical Hx, Surgical Hx, Family Hx, Social Hx, medications and allergies.   Review of Systems  Constitutional: Negative.   Gastrointestinal: Positive for abdominal pain. Negative for constipation, diarrhea, nausea and vomiting.  Genitourinary: Negative.     OBJECTIVE Patient Vitals for the past 24 hrs:  BP Temp Temp src Pulse Resp SpO2 Height Weight  01/12/19 1900 110/61 -- -- 87 18 -- -- --  01/12/19 1724 123/61 99 F (37.2 C) Oral (!) 103 18 100 % 5\' 4"  (1.626 m) 74.6 kg   Constitutional: Well-developed, well-nourished female in no acute distress.  Cardiovascular: normal rate & rhythm, no murmur Respiratory: normal rate and effort. Lung sounds clear throughout GI: Abd soft, non-tender, Pos BS x 4. No guarding or rebound tenderness MS: Extremities nontender, no edema, normal ROM Neurologic: Alert and oriented x 4.  GU: cervix closed   LAB RESULTS Results for orders placed or performed during the hospital  encounter of 01/12/19 (from the past 24 hour(s))  Urinalysis, Routine w reflex microscopic     Status: None   Collection Time: 01/12/19  5:18 PM  Result Value Ref Range   Color, Urine YELLOW YELLOW   APPearance CLEAR CLEAR   Specific Gravity, Urine 1.021 1.005 - 1.030   pH 6.0 5.0 - 8.0   Glucose, UA NEGATIVE NEGATIVE mg/dL   Hgb urine dipstick NEGATIVE NEGATIVE   Bilirubin Urine NEGATIVE NEGATIVE   Ketones, ur NEGATIVE NEGATIVE mg/dL   Protein, ur NEGATIVE NEGATIVE mg/dL   Nitrite NEGATIVE NEGATIVE   Leukocytes,Ua NEGATIVE  NEGATIVE    IMAGING No results found.  MAU COURSE Orders Placed This Encounter  Procedures  . Urinalysis, Routine w reflex microscopic  . Discharge patient   No orders of the defined types were placed in this encounter.   MDM VSS Cervix closed U/a without signs of infection  Pt informed that the ultrasound is considered a limited OB ultrasound and is not intended to be a complete ultrasound exam.  Patient also informed that the ultrasound is not being completed with the intent of assessing for fetal or placental anomalies or any pelvic abnormalities.  Explained that the purpose of today's ultrasound is to assess for  viability.  Patient acknowledges the purpose of the exam and the limitations of the study.  Live IUP with FHR 171 bpm   ASSESSMENT 1. Abdominal pain during pregnancy in first trimester   2. [redacted] weeks gestation of pregnancy     PLAN Discharge home in stable condition. SAB precautions F/u with ob as scheduled  Follow-up Information    [facility] Follow up.   Specialty: Obstetrics and Gynecology Why: keep scheduled appointment or call office as needed Contact information: [address] [place] [phone]       [facility] Follow up.   Specialty: Obstetrics and Gynecology Why: return for worsening symptoms Contact  information: [address] [id number] [place] [phone]         Allergies as of 01/12/2019      Reactions   Amoxicillin Hives, Shortness Of Breath, Swelling   Swelling of hands, face, and lip Has patient had a PCN reaction causing immediate rash, facial/tongue/throat swelling, SOB or lightheadedness with hypotension: Unknown Has patient had a PCN reaction causing severe rash involving mucus membranes or skin necrosis: Yes Has patient had a PCN reaction that required hospitalization: No Has patient had a PCN reaction occurring within the last 10 years: No If all of the above answers are "NO", then may proceed with Cephalosporin use.   Zofran [ondansetron Hcl] Nausea Only      Medication List    TAKE these medications   acetaminophen 500 MG tablet Commonly known as: TYLENOL Take 500 mg by mouth every 6 (six) hours as needed for moderate pain.   albuterol 108 (90 Base) MCG/ACT inhaler Commonly known as: VENTOLIN HFA Inhale 2 puffs into the lungs every 6 (six) hours as needed for wheezing or shortness of breath.   fluticasone 50 MCG/BLIST diskus inhaler Commonly known as: FLOVENT DISKUS Inhale 2 puffs into the lungs 2 (two) times daily.   mometasone 50 MCG/ACT nasal spray Commonly known as: NASONEX Place 2 sprays into the nose 2 (two) times daily.   prenatal multivitamin Tabs tablet Take 1 tablet by mouth daily at 12 noon.        Judeth HornLawrence, Nhu Glasby, NP 01/12/2019  7:56 PM

## 2019-01-30 NOTE — L&D Delivery Note (Signed)
Delivery Note   Patient Name: Angelica Kim DOB: [birth year] MRN: [id number]  Date of admission: 07/30/2019 Delivering MD: Dale Orland  Date of delivery: 07/31/19 Type of delivery: SVD  Newborn Data: Live born female  Birth Weight:   APGAR: 8, 9  Newborn Delivery   Birth date/time: 07/31/2019 15:24:00 Delivery type: Vaginal, Spontaneous    [name], 29 y.o., @ [redacted]w[redacted]d,  G1P0101, who was admitted for IOL with Preeclampsia with severe feature, mid range BP upon admission, had HA and PCR of 0.31 and was placed on magnesium, pt progressed through labor wit IP foley bulb, then AROM, then Pitocin, got to 26 of pitocin, and was noted to be completed, remained stable without HA, Vision changes or RUQ pain. Pt was given BMZ for Preterm induction @ 36.5 weeks on 7/1-7/2. I was called to the room when she progressed 2+ station in the second stage of labor.  She pushed for 45/min.  She delivered a viable infant, cephalic and restituted to the LOA position over an intact perineum.  A tight nuchal cord   was identified and unable to reduce, infant was summersault through. The baby was placed on maternal abdomen while initial step of NRP were perfmored (Dry, Stimulated, and warmed). Hat placed on baby for thermoregulation. Delayed cord clamping was performed for 2 minutes.  Cord double clamped and cut.  Cord cut by father. Apgar scores were 8 and 9. Prophylactic Pitocin was started in the third stage of labor for active management. The placenta delivered spontaneously, shultz, with a 3 vessel cord and was sent to LD.  Inspection revealed 2nd degree. An examination of the vaginal vault and cervix was free from lacerations. The uterus was firm, bleeding stable.  The repair was done under epidural.   Placenta was sent to LD, for culture as well as cultures were sen for sensitivity to GBS. ABx was switched to vancymycin but pt did not receive dose prior to delivery.  Pt was GBS+ and treated with 2 doses of clindamycin  but no sensitivity was performed. And umbilical artery blood gas were not sent.  There were no complications during the procedure.  Mom and baby skin to skin following delivery. Left in stable condition. Pt will remain on 2gm magnesium for 24 hours PP on ante until, nero checks, Q4H BP, Strict IO, LR of 37ml/hr. Plan to recheck labs in the morning.   BP 119/71   Pulse (!) 115   Temp 98.5 F (36.9 C) (Oral)   Resp 20   Ht 5' (1.524 m)   Wt 78.3 kg   LMP 11/16/2018   SpO2 99%   Breastfeeding Unknown   BMI 33.73 kg/m    Maternal Info: Anesthesia: Epidural Episiotomy: No Lacerations:  2nd Suture Repair: 3.0 vicryl CT Est. Blood Loss (mL):   Newborn Info:  Baby Sex: female Circumcision: N/A Babies Name: Angelica Kim  APGAR (1 MIN): 8   APGAR (5 MINS): 9   APGAR (10 MINS):     Mom to postpartum.  Baby to Couplet care / Skin to Skin.  Dr Normand Sloop aware of delivery.   [place], [place], NP-C 07/31/19 4:15 PM

## 2019-02-18 ENCOUNTER — Other Ambulatory Visit: Payer: BC Managed Care – PPO

## 2019-02-19 ENCOUNTER — Ambulatory Visit: Payer: BC Managed Care – PPO | Attending: Internal Medicine

## 2019-02-19 DIAGNOSIS — Z20822 Contact with and (suspected) exposure to covid-19: Secondary | ICD-10-CM

## 2019-02-20 LAB — NOVEL CORONAVIRUS, NAA: SARS-CoV-2, NAA: NOT DETECTED

## 2019-02-25 ENCOUNTER — Ambulatory Visit: Payer: BC Managed Care – PPO | Attending: Internal Medicine

## 2019-02-25 DIAGNOSIS — Z20822 Contact with and (suspected) exposure to covid-19: Secondary | ICD-10-CM

## 2019-02-26 LAB — NOVEL CORONAVIRUS, NAA: SARS-CoV-2, NAA: NOT DETECTED

## 2019-03-03 ENCOUNTER — Other Ambulatory Visit (HOSPITAL_COMMUNITY): Payer: Self-pay | Admitting: Obstetrics and Gynecology

## 2019-03-03 DIAGNOSIS — Z3689 Encounter for other specified antenatal screening: Secondary | ICD-10-CM

## 2019-03-03 DIAGNOSIS — Z3A19 19 weeks gestation of pregnancy: Secondary | ICD-10-CM

## 2019-03-23 ENCOUNTER — Other Ambulatory Visit: Payer: Self-pay

## 2019-03-23 ENCOUNTER — Ambulatory Visit
Admission: EM | Admit: 2019-03-23 | Discharge: 2019-03-23 | Disposition: A | Discharge status: Home / Self Care | Payer: BC Managed Care – PPO | Attending: Emergency Medicine | Admitting: Emergency Medicine

## 2019-03-23 ENCOUNTER — Encounter: Payer: Self-pay | Admitting: Emergency Medicine

## 2019-03-23 DIAGNOSIS — W540XXA Bitten by dog, initial encounter: Secondary | ICD-10-CM

## 2019-03-23 DIAGNOSIS — S61411A Laceration without foreign body of right hand, initial encounter: Secondary | ICD-10-CM | POA: Diagnosis not present

## 2019-03-23 DIAGNOSIS — T148XXA Other injury of unspecified body region, initial encounter: Secondary | ICD-10-CM

## 2019-03-23 MED ORDER — CEPHALEXIN 500 MG PO CAPS: 500.0000 mg | ORAL_CAPSULE | Freq: Two times a day (BID) | ORAL | 0 refills | Status: AC

## 2019-03-23 MED ORDER — TETANUS-DIPHTH-ACELL PERTUSSIS 5-2.5-18.5 LF-MCG/0.5 IM SUSP
0.5000 mL | Freq: Once | INTRAMUSCULAR | Status: AC
  Administered 2019-03-23: 0.5 mL via INTRAMUSCULAR

## 2019-03-23 NOTE — ED Triage Notes (Addendum)
Pt presents to [facility] for assessment of dog bites to right hand occurring last night by her own dog who was fighting with her other dog and she tried to break them up.  Last tetanus unknown, animals up to date on rabies.  Several puncture wounds to right hand noted.

## 2019-03-23 NOTE — ED Notes (Signed)
Patient able to ambulate independently  

## 2019-03-23 NOTE — Discharge Instructions (Addendum)
Keep area(s) clean and dry. Take antibiotic as prescribed with food - important to complete course. Return for worsening pain, redness, swelling, discharge, fever. 

## 2019-03-23 NOTE — ED Provider Notes (Addendum)
[name]    CSN: [id number] Arrival date & time: 03/23/19  7564      History   Chief Complaint Chief Complaint  Patient presents with  . Animal Bite    HPI Angelica Kim is a 29 y.o. female presenting for dog bite to her right hand that occurred last night.  States bleeding was controlled with hemostasis PTA.  Unknown when her last tetanus shot was.  Animals are up-to-date on rabies vaccination.  Patient dressing some pain that is worse with movement or pressure, though denies decreased range of motion, weakness, sensory changes.  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    Past Medical History:  Diagnosis Date  . Ankylosing spondylitis ([facility])   . Asthma   . Fibromyalgia   . Kidney stones   . Migraine   . Ovarian cyst     Patient Active Problem List   Diagnosis Date Noted  . Generalized anxiety disorder 04/07/2014  . Migraines 12/21/2013  . Low back pain 12/21/2013  . Cognitive changes 12/21/2013    Past Surgical History:  Procedure Laterality Date  . COLONOSCOPY    . ESOPHAGOGASTRODUODENOSCOPY ENDOSCOPY    . spinal tap    . WISDOM TOOTH EXTRACTION      OB History    Gravida  1   Para      Term      Preterm      AB      Living  0     SAB      TAB      Ectopic      Multiple      Live Births               Home Medications    Prior to Admission medications   Medication Sig Start Date End Date Taking? Authorizing Provider  acetaminophen (TYLENOL) 500 MG tablet Take 500 mg by mouth every 6 (six) hours as needed for moderate pain.    [provider]  albuterol (PROVENTIL HFA;VENTOLIN HFA) 108 (90 BASE) MCG/ACT inhaler Inhale 2 puffs into the lungs every 6 (six) hours as needed for wheezing or shortness of breath.    [provider]  cephALEXin (KEFLEX) 500 MG capsule Take 1 capsule (500 mg total) by mouth 2 (two) times daily for 5 days. 03/23/19  03/28/19  Hall-Potvin, [place], PA-C  fluticasone (FLOVENT DISKUS) 50 MCG/BLIST diskus inhaler Inhale 2 puffs into the lungs 2 (two) times daily.     [provider]  mometasone (NASONEX) 50 MCG/ACT nasal spray Place 2 sprays into the nose 2 (two) times daily.     [provider]  Prenatal Vit-Fe Fumarate-FA (PRENATAL MULTIVITAMIN) TABS tablet Take 1 tablet by mouth daily at 12 noon.    [provider]  metoCLOPramide (REGLAN) 10 MG tablet Take 1 tablet (10 mg total) by mouth every 6 (six) hours. 03/22/17 07/31/18  [name], Coral Else, PA-C    Family History Family History  Problem Relation Age of Onset  . Arthritis Mother   . Hypertension Maternal Grandmother   . Thyroid disease Maternal Grandmother   . Kidney Stones Maternal Grandfather     Social History Social History   Tobacco Use  . Smoking status: Never Smoker  . Smokeless tobacco: Never Used  Substance Use Topics  . Alcohol use: Yes    Alcohol/week: 0.0 standard drinks    Comment: occassionally wine  every other  weekend  . Drug use: No     Allergies   Amoxicillin and Zofran [ondansetron hcl]   Review of Systems As per HPI   Physical Exam Triage Vital Signs ED Triage Vitals  Enc Vitals Group     BP 03/23/19 0922 110/69     Pulse Rate 03/23/19 0922 (!) 112     Resp 03/23/19 0922 18     Temp 03/23/19 0922 (!) 97.5 F (36.4 C)     Temp Source 03/23/19 0922 Temporal     SpO2 03/23/19 0922 99 %     Weight --      Height --      Head Circumference --      Peak Flow --      Pain Score 03/23/19 0923 6     Pain Loc --      Pain Edu? --      Excl. in [place]? --    No data found.  Updated Vital Signs BP 110/69 (BP Location: Left Arm)   Pulse (!) 112   Temp (!) 97.5 F (36.4 C) (Temporal)   Resp 18   LMP 11/16/2018   SpO2 99%   Visual Acuity Right Eye Distance:   Left Eye Distance:   Bilateral Distance:    Right Eye Near:   Left Eye Near:    Bilateral Near:     Physical  Exam Constitutional:      General: She is not in acute distress. HENT:     Head: Normocephalic and atraumatic.  Eyes:     General: No scleral icterus.    Pupils: Pupils are equal, round, and reactive to light.  Cardiovascular:     Rate and Rhythm: Regular rhythm. Tachycardia present.     Comments: HR 105-109 at bedside Pulmonary:     Effort: Pulmonary effort is normal. No respiratory distress.     Breath sounds: No wheezing.  Musculoskeletal:        General: Normal range of motion.     Comments: Right hand mildly swollen as compared to left surrounding puncture wounds.  Mild TTP no crepitus or purulence excised.  Wrists and hands neurovascularly intact bilaterally  Skin:    Capillary Refill: Capillary refill takes less than 2 seconds.     Coloration: Skin is not jaundiced or pale.     Comments: Several superficial punctate wounds over dorsal lateral aspect of hand.  Largest wound (0.5 cm) without foreign body.  Neurological:     Mental Status: She is alert and oriented to person, place, and time.      UC Treatments / Results  Labs (all labs ordered are listed, but only abnormal results are displayed) Labs Reviewed - No data to display  EKG   Radiology No results found.  Procedures Laceration Repair  Date/Time: 03/23/2019 11:49 AM Performed by: Quincy Sheehan, PA-C Authorized by: Quincy Sheehan, PA-C   Consent:    Consent obtained:  Verbal   Consent given by:  Patient   Risks discussed:  Infection, need for additional repair, pain, poor cosmetic result and poor wound healing   Alternatives discussed:  No treatment and delayed treatment Universal protocol:    Patient identity confirmed:  Verbally with patient Anesthesia (see MAR for exact dosages):    Anesthesia method:  None Laceration details:    Location:  Hand   Hand location:  R hand, dorsum   Length (cm):  0.5   Depth (mm):  3 Repair type:    Repair type:  Simple Pre-procedure details:     Preparation:  Patient was prepped and draped in usual sterile fashion Exploration:    Hemostasis achieved with:  Direct pressure   Wound exploration: wound explored through full range of motion     Wound extent: no areolar tissue violation noted, no fascia violation noted and no foreign bodies/material noted     Contaminated: no   Treatment:    Area cleansed with:  Soap and water   Amount of cleaning:  Extensive   Irrigation solution:  Tap water   Irrigation volume:  2 minutes   Irrigation method:  Tap Skin repair:    Repair method:  Steri-Strips   Number of Steri-Strips:  2 Approximation:    Approximation:  Close Post-procedure details:    Dressing:  Open (no dressing)   Patient tolerance of procedure:  Tolerated well, no immediate complications   (including critical care time)  Medications Ordered in UC Medications  Tdap (BOOSTRIX) injection 0.5 mL (0.5 mLs Intramuscular Given 03/23/19 1019)    Initial Impression / Assessment and Plan / UC Course  I have reviewed the triage vital signs and the nursing notes.  Pertinent labs & imaging results that were available during my care of the patient were reviewed by me and considered in my medical decision making (see chart for details).     Patient afebrile, nontoxic in office today.  Tetanus updated in office which patient tolerated well.  Rabies up-to-date.  Wound irrigated in office, Steri-Strips applied patient tolerated well.  Low concern for fracture at this time, though discussed that should swelling persist over the next 5 to 7 days, we would consider radiography at that time-patient verbalized understanding, agreeable to this.  Will provide short-term course of antibiotics given animal bite/puncture wound.  Keflex agreed upon as patient is [redacted] weeks gestation, and amoxicillin allergy.  Patient states she is tolerated Rocephin well in the past.  Return precautions discussed, patient verbalized understanding and is agreeable to  plan. Final Clinical Impressions(s) / UC Diagnoses   Final diagnoses:  Animal bite  Laceration of right hand without foreign body, initial encounter     Discharge Instructions     Keep area(s) clean and dry. Take antibiotic as prescribed with food - important to complete course. Return for worsening pain, redness, swelling, discharge, fever.    ED Prescriptions    Medication Sig Dispense Auth. Provider   cephALEXin (KEFLEX) 500 MG capsule Take 1 capsule (500 mg total) by mouth 2 (two) times daily for 5 days. 10 capsule Hall-Potvin, [place], PA-C     PDMP not reviewed this encounter.   Hall-Potvin, [place], PA-C 03/23/19 1149    Hall-Potvin, [place], [place] 03/23/19 1151

## 2019-03-30 ENCOUNTER — Ambulatory Visit (HOSPITAL_COMMUNITY)
Admission: RE | Admit: 2019-03-30 | Discharge: 2019-03-30 | Disposition: A | Discharge status: Home / Self Care | Payer: BC Managed Care – PPO | Source: Ambulatory Visit | Attending: Obstetrics and Gynecology | Admitting: Obstetrics and Gynecology

## 2019-03-30 ENCOUNTER — Other Ambulatory Visit (HOSPITAL_COMMUNITY): Payer: Self-pay | Admitting: *Deleted

## 2019-03-30 ENCOUNTER — Other Ambulatory Visit: Payer: Self-pay

## 2019-03-30 DIAGNOSIS — Z3A19 19 weeks gestation of pregnancy: Secondary | ICD-10-CM

## 2019-03-30 DIAGNOSIS — Z3689 Encounter for other specified antenatal screening: Secondary | ICD-10-CM | POA: Diagnosis present

## 2019-03-30 DIAGNOSIS — Z363 Encounter for antenatal screening for malformations: Secondary | ICD-10-CM

## 2019-03-30 DIAGNOSIS — Z362 Encounter for other antenatal screening follow-up: Secondary | ICD-10-CM

## 2019-04-28 ENCOUNTER — Other Ambulatory Visit: Payer: Self-pay

## 2019-04-28 ENCOUNTER — Ambulatory Visit (HOSPITAL_COMMUNITY)
Admission: RE | Admit: 2019-04-28 | Discharge: 2019-04-28 | Disposition: A | Discharge status: Home / Self Care | Payer: BC Managed Care – PPO | Source: Ambulatory Visit | Attending: Obstetrics and Gynecology | Admitting: Obstetrics and Gynecology

## 2019-04-28 DIAGNOSIS — J45909 Unspecified asthma, uncomplicated: Secondary | ICD-10-CM | POA: Diagnosis not present

## 2019-04-28 DIAGNOSIS — O99891 Other specified diseases and conditions complicating pregnancy: Secondary | ICD-10-CM

## 2019-04-28 DIAGNOSIS — Z362 Encounter for other antenatal screening follow-up: Secondary | ICD-10-CM | POA: Diagnosis present

## 2019-04-28 DIAGNOSIS — Z3A23 23 weeks gestation of pregnancy: Secondary | ICD-10-CM

## 2019-07-19 ENCOUNTER — Other Ambulatory Visit: Payer: Self-pay

## 2019-07-19 ENCOUNTER — Encounter (HOSPITAL_COMMUNITY): Payer: Self-pay | Admitting: Obstetrics and Gynecology

## 2019-07-19 ENCOUNTER — Inpatient Hospital Stay (HOSPITAL_COMMUNITY)
Admission: AD | Admit: 2019-07-19 | Discharge: 2019-07-20 | Disposition: A | Discharge status: Home / Self Care | Payer: Managed Care, Other (non HMO) | Attending: Obstetrics & Gynecology | Admitting: Obstetrics & Gynecology

## 2019-07-19 DIAGNOSIS — O1490 Unspecified pre-eclampsia, unspecified trimester: Secondary | ICD-10-CM | POA: Diagnosis present

## 2019-07-19 DIAGNOSIS — O1493 Unspecified pre-eclampsia, third trimester: Secondary | ICD-10-CM | POA: Insufficient documentation

## 2019-07-19 DIAGNOSIS — B373 Candidiasis of vulva and vagina: Secondary | ICD-10-CM | POA: Diagnosis not present

## 2019-07-19 DIAGNOSIS — O26893 Other specified pregnancy related conditions, third trimester: Secondary | ICD-10-CM | POA: Insufficient documentation

## 2019-07-19 DIAGNOSIS — Z87891 Personal history of nicotine dependence: Secondary | ICD-10-CM | POA: Insufficient documentation

## 2019-07-19 DIAGNOSIS — J45909 Unspecified asthma, uncomplicated: Secondary | ICD-10-CM | POA: Diagnosis not present

## 2019-07-19 DIAGNOSIS — O99513 Diseases of the respiratory system complicating pregnancy, third trimester: Secondary | ICD-10-CM | POA: Diagnosis not present

## 2019-07-19 DIAGNOSIS — Z3A35 35 weeks gestation of pregnancy: Secondary | ICD-10-CM | POA: Diagnosis not present

## 2019-07-19 DIAGNOSIS — M797 Fibromyalgia: Secondary | ICD-10-CM | POA: Insufficient documentation

## 2019-07-19 DIAGNOSIS — Z79899 Other long term (current) drug therapy: Secondary | ICD-10-CM | POA: Insufficient documentation

## 2019-07-19 DIAGNOSIS — O98813 Other maternal infectious and parasitic diseases complicating pregnancy, third trimester: Secondary | ICD-10-CM | POA: Insufficient documentation

## 2019-07-19 DIAGNOSIS — O99891 Other specified diseases and conditions complicating pregnancy: Secondary | ICD-10-CM | POA: Diagnosis not present

## 2019-07-19 DIAGNOSIS — B3731 Acute candidiasis of vulva and vagina: Secondary | ICD-10-CM

## 2019-07-19 DIAGNOSIS — N760 Acute vaginitis: Secondary | ICD-10-CM | POA: Diagnosis not present

## 2019-07-19 DIAGNOSIS — R42 Dizziness and giddiness: Secondary | ICD-10-CM | POA: Insufficient documentation

## 2019-07-19 LAB — COMPREHENSIVE METABOLIC PANEL
ALT: 10 U/L (ref 0–44)
AST: 14 U/L — ABNORMAL LOW (ref 15–41)
Albumin: 2.5 g/dL — ABNORMAL LOW (ref 3.5–5.0)
Alkaline Phosphatase: 156 U/L — ABNORMAL HIGH (ref 38–126)
Anion gap: 8 (ref 5–15)
BUN: <5 mg/dL — ABNORMAL LOW (ref 6–20)
CO2: 20 mmol/L — ABNORMAL LOW (ref 22–32)
Calcium: 8.8 mg/dL — ABNORMAL LOW (ref 8.9–10.3)
Chloride: 110 mmol/L (ref 98–111)
Creatinine, Ser: 0.65 mg/dL (ref 0.44–1.00)
GFR calc Af Amer: >60 mL/min (ref >60)
GFR calc non Af Amer: >60 mL/min (ref >60)
Glucose, Bld: 86 mg/dL (ref 70–99)
Potassium: 3.1 mmol/L — ABNORMAL LOW (ref 3.5–5.1)
Sodium: 138 mmol/L (ref 135–145)
Total Bilirubin: 0.6 mg/dL (ref 0.3–1.2)
Total Protein: 5.4 g/dL — ABNORMAL LOW (ref 6.5–8.1)

## 2019-07-19 LAB — URINALYSIS, ROUTINE W REFLEX MICROSCOPIC
Bilirubin Urine: NEGATIVE
Glucose, UA: NEGATIVE mg/dL
Hgb urine dipstick: NEGATIVE
Ketones, ur: NEGATIVE mg/dL
Nitrite: NEGATIVE
Protein, ur: NEGATIVE mg/dL
Specific Gravity, Urine: 1.01 (ref 1.005–1.030)
pH: 7 (ref 5.0–8.0)

## 2019-07-19 LAB — CBC
HCT: 28.4 % — ABNORMAL LOW (ref 36.0–46.0)
Hemoglobin: 10 g/dL — ABNORMAL LOW (ref 12.0–15.0)
MCH: 30.2 pg (ref 26.0–34.0)
MCHC: 35.2 g/dL (ref 30.0–36.0)
MCV: 85.8 fL (ref 80.0–100.0)
Platelets: 212 10*3/uL (ref 150–400)
RBC: 3.31 MIL/uL — ABNORMAL LOW (ref 3.87–5.11)
RDW: 13.2 % (ref 11.5–15.5)
WBC: 18.5 10*3/uL — ABNORMAL HIGH (ref 4.0–10.5)
nRBC: 0 % (ref 0.0–0.2)

## 2019-07-19 LAB — WET PREP, GENITAL
Clue Cells Wet Prep HPF POC: NONE SEEN
Sperm: NONE SEEN
Trich, Wet Prep: NONE SEEN

## 2019-07-19 NOTE — MAU Note (Signed)
Pt reports she has been having some nausea and vomiting in the morning if she drinks fluids before she eats she vomits but if she eats and then drinks she is fine.  Also c/o increased white thick vaginal discharge that is itchy thinks it may be a yeast infection.  also c/o feeling dizzy a few times today and lower back pain and cramping on and off.   Good fetal movement reported.

## 2019-07-19 NOTE — [facility]U Provider Note (Signed)
Chief Complaint:  Abdominal Cramping, Vaginal Discharge, and Dizziness   First Provider Initiated Contact with Patient 07/19/19 2249     HPI: Angelica Kim is a 29 y.o. G1P0 at [redacted]w[redacted]d who presents to maternity admissions reporting vaginal discharge, abdominal cramping, and dizziness.  Reports vaginal discharge since this morning.  Is associated with a thick white discharge.  Has not treated her symptoms.  Feels like she has a yeast infection. Has also reported lower abdominal cramping over the weekend that is constant.  Denies leaking of fluid, or vaginal bleeding. Denies dysuria. Good fetal movement. Has had some intermittent dizziness that she has spoken with her OB/GYN about.  Occurs if she gets up too quickly or changes positions too quickly.  Was reassured by her OB regarding the symptoms last week.  Symptoms have not changed. Denies any history of hypertension.  Denies visual disturbance, epigastric pain, or headache.  Location: abdomen, back Quality: cramping Severity: 4/10 in pain scale Duration: 2 days Timing: intermittent Modifying factors: none Associated signs and symptoms: none  Pregnancy Course: [facility]. Denies complications with pregnancy. Next appointment 7/1  Past Medical History:  Diagnosis Date  . Ankylosing spondylitis (HCC)   . Asthma   . Fibromyalgia   . Kidney stones   . Migraine   . Ovarian cyst    OB History  Gravida Para Term Preterm AB Living  1         0  SAB TAB Ectopic Multiple Live Births               # Outcome Date GA Lbr Len/2nd Weight Sex Delivery Anes PTL Lv  1 Current            Past Surgical History:  Procedure Laterality Date  . COLONOSCOPY    . ESOPHAGOGASTRODUODENOSCOPY ENDOSCOPY    . spinal tap    . WISDOM TOOTH EXTRACTION    . WISDOM TOOTH EXTRACTION  2010   Family History  Problem Relation Age of Onset  . Arthritis Mother   . Hypertension Maternal Grandmother   . Thyroid disease Maternal Grandmother   . Kidney Stones Maternal  Grandfather    Social History   Tobacco Use  . Smoking status: Former Smoker    Quit date: 2014    Years since quitting: 7.4  . Smokeless tobacco: Never Used  Vaping Use  . Vaping Use: Never used  Substance Use Topics  . Alcohol use: Yes    Alcohol/week: 0.0 standard drinks    Comment: occassionally wine  every other weekend  . Drug use: No   Allergies  Allergen Reactions  . Amoxicillin Hives, Shortness Of Breath and Swelling    Swelling of hands, face, and lip Has patient had a PCN reaction causing immediate rash, facial/tongue/throat swelling, SOB or lightheadedness with hypotension: Unknown Has patient had a PCN reaction causing severe rash involving mucus membranes or skin necrosis: Yes Has patient had a PCN reaction that required hospitalization: No Has patient had a PCN reaction occurring within the last 10 years: No If all of the above answers are "NO", then may proceed with Cephalosporin use.   Marland Kitchen Zofran [Ondansetron Hcl] Nausea Only   No medications prior to admission.    I have reviewed patient's Past Medical Hx, Surgical Hx, Family Hx, Social Hx, medications and allergies.   ROS:  Review of Systems  Constitutional: Negative.   Eyes: Negative for visual disturbance.  Gastrointestinal: Positive for abdominal pain and nausea. Negative for constipation, diarrhea and vomiting.  Genitourinary: Positive for vaginal discharge. Negative for dysuria, genital sores and vaginal bleeding.  Musculoskeletal: Positive for back pain.  Neurological: Negative for headaches.    Physical Exam   Patient Vitals for the past 24 hrs:  BP Temp Temp src Pulse Resp SpO2 Height Weight  07/20/19 0050 (!) 142/91 98.1 F (36.7 C) Oral 92 18 97 % -- --  07/19/19 2345 133/86 -- -- 88 -- 100 % -- --  07/19/19 2330 (!) 137/95 -- -- 96 -- -- -- --  07/19/19 2315 137/89 -- -- 91 -- -- -- --  07/19/19 2245 (!) 147/96 -- -- 97 -- 99 % -- --  07/19/19 2230 (!) 129/96 -- -- (!) 101 -- 99 % --  --  07/19/19 2225 (!) 143/106 -- -- 95 -- 99 % -- --  07/19/19 2205 (!) 143/92 -- -- (!) 102 -- 99 % -- --  07/19/19 2149 (!) 143/88 98.4 F (36.9 C) -- 97 18 -- 5' (1.524 m) 79.8 kg    Constitutional: Well-developed, well-nourished female in no acute distress.  Cardiovascular: normal rate & rhythm, no murmur Respiratory: normal effort, lung sounds clear throughout GI: Abd soft, non-tender, gravid appropriate for gestational age. Pos BS x 4 MS: Extremities nontender, no edema, normal ROM Neurologic: Alert and oriented x 4.  GU:      Pelvic: NEFG, physiologic discharge, no blood, cervix clean.   Dilation: Fingertip Effacement (%): 50 Cervical Position: Posterior Station: Ballotable Presentation: Vertex Exam by:: Jorje Guild NP  Fetal Tracing:  Baseline: 145 Variability: moderate Accelerations: 15x15 Decelerations: none  Toco: irregular    Labs: Results for orders placed or performed during the hospital encounter of 07/19/19 (from the past 24 hour(s))  Urinalysis, Routine w reflex microscopic     Status: Abnormal   Collection Time: 07/19/19 10:07 PM  Result Value Ref Range   Color, Urine YELLOW YELLOW   APPearance HAZY (A) CLEAR   Specific Gravity, Urine 1.010 1.005 - 1.030   pH 7.0 5.0 - 8.0   Glucose, UA NEGATIVE NEGATIVE mg/dL   Hgb urine dipstick NEGATIVE NEGATIVE   Bilirubin Urine NEGATIVE NEGATIVE   Ketones, ur NEGATIVE NEGATIVE mg/dL   Protein, ur NEGATIVE NEGATIVE mg/dL   Nitrite NEGATIVE NEGATIVE   Leukocytes,Ua TRACE (A) NEGATIVE   RBC / HPF 6-10 0 - 5 RBC/hpf   WBC, UA 6-10 0 - 5 WBC/hpf   Bacteria, UA RARE (A) NONE SEEN   Squamous Epithelial / LPF 0-5 0 - 5   Mucus PRESENT   Protein / creatinine ratio, urine     Status: Abnormal   Collection Time: 07/19/19 10:07 PM  Result Value Ref Range   Creatinine, Urine 99.71 mg/dL   Total Protein, Urine 33 mg/dL   Protein Creatinine Ratio 0.33 (H) 0.00 - 0.15 mg/mg[Cre]  Wet prep, genital     Status:  Abnormal   Collection Time: 07/19/19 11:11 PM  Result Value Ref Range   Yeast Wet Prep HPF POC PRESENT (A) NONE SEEN   Trich, Wet Prep NONE SEEN NONE SEEN   Clue Cells Wet Prep HPF POC NONE SEEN NONE SEEN   WBC, Wet Prep HPF POC MODERATE (A) NONE SEEN   Sperm NONE SEEN   CBC     Status: Abnormal   Collection Time: 07/19/19 11:13 PM  Result Value Ref Range   WBC 18.5 (H) 4.0 - 10.5 K/uL   RBC 3.31 (L) 3.87 - 5.11 MIL/uL   Hemoglobin 10.0 (L) 12.0 - 15.0 g/dL  HCT 28.4 (L) 36 - 46 %   MCV 85.8 80.0 - 100.0 fL   MCH 30.2 26.0 - 34.0 pg   MCHC 35.2 30.0 - 36.0 g/dL   RDW 96.7 59.1 - 63.8 %   Platelets 212 150 - 400 K/uL   nRBC 0.0 0.0 - 0.2 %  Comprehensive metabolic panel     Status: Abnormal   Collection Time: 07/19/19 11:13 PM  Result Value Ref Range   Sodium 138 135 - 145 mmol/L   Potassium 3.1 (L) 3.5 - 5.1 mmol/L   Chloride 110 98 - 111 mmol/L   CO2 20 (L) 22 - 32 mmol/L   Glucose, Bld 86 70 - 99 mg/dL   BUN <5 (L) 6 - 20 mg/dL   Creatinine, Ser 4.66 0.44 - 1.00 mg/dL   Calcium 8.8 (L) 8.9 - 10.3 mg/dL   Total Protein 5.4 (L) 6.5 - 8.1 g/dL   Albumin 2.5 (L) 3.5 - 5.0 g/dL   AST 14 (L) 15 - 41 U/L   ALT 10 0 - 44 U/L   Alkaline Phosphatase 156 (H) 38 - 126 U/L   Total Bilirubin 0.6 0.3 - 1.2 mg/dL   GFR calc non Af Amer >60 >60 mL/min   GFR calc Af Amer >60 >60 mL/min   Anion gap 8 5 - 15    Imaging:  No results found.  MAU Course: Orders Placed This Encounter  Procedures  . Culture, OB Urine  . Wet prep, genital  . Urinalysis, Routine w reflex microscopic  . CBC  . Comprehensive metabolic panel  . Protein / creatinine ratio, urine  . Discharge patient   Meds ordered this encounter  Medications  . terconazole (TERAZOL 7) 0.4 % vaginal cream    Sig: Place 1 applicator vaginally at bedtime. Use for seven days    Dispense:  45 g    Refill:  0    Order Specific Question:   Supervising Provider    Answer:   Alysia Penna, MICHAEL L [[id number]]    MDM: Complaining  of thick discharge & vaginal irritation. Wet prep positive for yeast. Will prescribe terazol.   Also reports some abdominal cramping. No regular contractions on monitor. Cervix 0.5/40/ballotable & unchanged after 1+ hour of monitoring. Pt reports improvement in symptoms since resting in MAU.   New onset elevated BPs. Pt without symptoms & normal labs. Protein creatinine ratio elevated at 0.33. Will give diagnosis of preeclampsia & have patient f/u in office soon.   Assessment: 1. Pre-eclampsia in third trimester   2. [redacted] weeks gestation of pregnancy   3. Yeast vaginitis     Plan: Discharge home in stable condition.  Rx terazol S/w [facility] CNM Rhea Pink), informed of preeclampsia diagnosis & need for close f/u this week in office Reviewed reasons to return to [facility]U including s/s preeclampsia    Follow-up Information    [facility] Follow up.   Specialty: Obstetrics and Gynecology Why: the office will call you for your follow up appointment. You should be seen in the office this week.  Contact information: [address] [address] [place] [address] [phone]              Allergies as of 07/20/2019      Reactions   Amoxicillin Hives, Shortness Of Breath, Swelling   Swelling of hands, face, and lip Has patient had a PCN reaction causing immediate rash, facial/tongue/throat swelling, SOB or lightheadedness with hypotension: Unknown Has patient had  a PCN reaction causing severe rash involving mucus membranes or skin necrosis: Yes Has patient had a PCN reaction that required hospitalization: No Has patient had a PCN reaction occurring within the last 10 years: No If all of the above answers are "NO", then may proceed with Cephalosporin use.   Zofran [ondansetron Hcl] Nausea Only      Medication List    TAKE these medications   acetaminophen 500 MG tablet Commonly known as: TYLENOL Take 500 mg by mouth every 6 (six) hours  as needed for moderate pain.   albuterol 108 (90 Base) MCG/ACT inhaler Commonly known as: VENTOLIN HFA Inhale 2 puffs into the lungs every 6 (six) hours as needed for wheezing or shortness of breath.   fluticasone 50 MCG/BLIST diskus inhaler Commonly known as: FLOVENT DISKUS Inhale 2 puffs into the lungs 2 (two) times daily.   mometasone 50 MCG/ACT nasal spray Commonly known as: NASONEX Place 2 sprays into the nose 2 (two) times daily.   prenatal multivitamin Tabs tablet Take 1 tablet by mouth daily at 12 noon.   terconazole 0.4 % vaginal cream Commonly known as: TERAZOL 7 Place 1 applicator vaginally at bedtime. Use for seven days       Judeth Horn, NP 07/20/2019 1:37 AM

## 2019-07-20 DIAGNOSIS — O1493 Unspecified pre-eclampsia, third trimester: Secondary | ICD-10-CM

## 2019-07-20 DIAGNOSIS — N760 Acute vaginitis: Secondary | ICD-10-CM

## 2019-07-20 DIAGNOSIS — O99891 Other specified diseases and conditions complicating pregnancy: Secondary | ICD-10-CM

## 2019-07-20 DIAGNOSIS — Z3A35 35 weeks gestation of pregnancy: Secondary | ICD-10-CM

## 2019-07-20 DIAGNOSIS — B9689 Other specified bacterial agents as the cause of diseases classified elsewhere: Secondary | ICD-10-CM

## 2019-07-20 LAB — PROTEIN / CREATININE RATIO, URINE
Creatinine, Urine: 99.71 mg/dL
Protein Creatinine Ratio: 0.33 mg/mg{Cre} — ABNORMAL HIGH (ref 0.00–0.15)
Total Protein, Urine: 33 mg/dL

## 2019-07-20 MED ORDER — TERCONAZOLE 0.4 % VA CREA: 1.0000 | TOPICAL_CREAM | Freq: Every day | VAGINAL | 0 refills | Status: DC

## 2019-07-20 NOTE — Discharge Instructions (Signed)
Hypertension During Pregnancy High blood pressure (hypertension) is when the force of blood pumping through the arteries is too strong. Arteries are blood vessels that carry blood from the heart throughout the body. Hypertension during pregnancy can be mild or severe. Severe hypertension during pregnancy (preeclampsia) is a medical emergency that requires prompt evaluation and treatment. Different types of hypertension can happen during pregnancy. These include:  Chronic hypertension. This happens when you had high blood pressure before you became pregnant, and it continues during the pregnancy. Hypertension that develops before you are [redacted] weeks pregnant and continues during the pregnancy is also called chronic hypertension. If you have chronic hypertension, it will not go away after you have your baby. You will need follow-up visits with your health care provider after you have your baby. Your doctor may want you to keep taking medicine for your blood pressure.  Gestational hypertension. This is hypertension that develops after the 20th week of pregnancy. Gestational hypertension usually goes away after you have your baby, but your health care provider will need to monitor your blood pressure to make sure that it is getting better.  Preeclampsia. This is severe hypertension during pregnancy. This can cause serious complications for you and your baby and can also cause complications for you after the delivery of your baby.  Postpartum preeclampsia. You may develop severe hypertension after giving birth. This usually occurs within 48 hours after childbirth but may occur up to 6 weeks after giving birth. This is rare. How does this affect me? Women who have hypertension during pregnancy have a greater chance of developing hypertension later in life or during future pregnancies. In some cases, hypertension during pregnancy can cause serious complications, such as:  Stroke.  Heart attack.  Injury to  other organs, such as kidneys, lungs, or liver.  Preeclampsia.  Convulsions or seizures.  Placental abruption. How does this affect my baby? Hypertension during pregnancy can affect your baby. Your baby may:  Be born early (prematurely).  Not weigh as much as he or she should at birth (low birth weight).  Not tolerate labor well, leading to an unplanned cesarean delivery. What are the risks? There are certain factors that make it more likely for you to develop hypertension during pregnancy. These include:  Having hypertension during a previous pregnancy.  Being overweight.  Being age 35 or older.  Being pregnant for the first time.  Being pregnant with more than one baby.  Becoming pregnant using fertilization methods, such as IVF (in vitro fertilization).  Having other medical problems, such as diabetes, kidney disease, or lupus.  Having a family history of hypertension. What can I do to lower my risk? The exact cause of hypertension during pregnancy is not known. You may be able to lower your risk by:  Maintaining a healthy weight.  Eating a healthy and balanced diet.  Following your health care provider's instructions about treating any long-term conditions that you had before becoming pregnant. It is very important to keep all of your prenatal care appointments. Your health care provider will check your blood pressure and make sure that your pregnancy is progressing as expected. If a problem is found, early treatment can prevent complications. How is this treated? Treatment for hypertension during pregnancy varies depending on the type of hypertension you have and how serious it is.  If you were taking medicine for high blood pressure before you became pregnant, talk with your health care provider. You may need to change medicine during pregnancy because   some medicines, like ACE inhibitors, may not be considered safe for your baby.  If you have gestational  hypertension, your health care provider may order medicine to treat this during pregnancy.  If you are at risk for preeclampsia, your health care provider may recommend that you take a low-dose aspirin during your pregnancy.  If you have severe hypertension, you may need to be hospitalized so you and your baby can be monitored closely. You may also need to be given medicine to lower your blood pressure. This medicine may be given by mouth or through an IV.  In some cases, if your condition gets worse, you may need to deliver your baby early. Follow these instructions at home: Eating and drinking   Drink enough fluid to keep your urine pale yellow.  Avoid caffeine. Lifestyle  Do not use any products that contain nicotine or tobacco, such as cigarettes, e-cigarettes, and chewing tobacco. If you need help quitting, ask your health care provider.  Do not use alcohol or drugs.  Avoid stress as much as possible.  Rest and get plenty of sleep.  Regular exercise can help to reduce your blood pressure. Ask your health care provider what kinds of exercise are best for you. General instructions  Take over-the-counter and prescription medicines only as told by your health care provider.  Keep all prenatal and follow-up visits as told by your health care provider. This is important. Contact a health care provider if:  You have symptoms that your health care provider told you may require more treatment or monitoring, such as: ? Headaches. ? Nausea or vomiting. ? Abdominal pain. ? Dizziness. ? Light-headedness. Get help right away if:  You have: ? Severe abdominal pain that does not get better with treatment. ? A severe headache that does not get better. ? Vomiting that does not get better. ? Sudden, rapid weight gain. ? Sudden swelling in your hands, ankles, or face. ? Vaginal bleeding. ? Blood in your urine. ? Blurred or double vision. ? Shortness of breath or chest  pain. ? Weakness on one side of your body. ? Difficulty speaking.  Your baby is not moving as much as usual. Summary  High blood pressure (hypertension) is when the force of blood pumping through the arteries is too strong.  Hypertension during pregnancy can cause problems for you and your baby.  Treatment for hypertension during pregnancy varies depending on the type of hypertension you have and how serious it is.  Keep all prenatal and follow-up visits as told by your health care provider. This is important. This information is not intended to replace advice given to you by your health care provider. Make sure you discuss any questions you have with your health care provider. Document Revised: 05/08/2018 Document Reviewed: 02/11/2018 Elsevier Patient Education  2020 [employer]      Vaginal Yeast Infection, Adult  Vaginal yeast infection is a condition that causes vaginal discharge as well as soreness, swelling, and redness (inflammation) of the vagina. This is a common condition. Some women get this infection frequently. What are the causes? This condition is caused by a change in the normal balance of the yeast (candida) and bacteria that live in the vagina. This change causes an overgrowth of yeast, which causes the inflammation. What increases the risk? The condition is more likely to develop in women who:  Take antibiotic medicines.  Have diabetes.  Take birth control pills.  Are pregnant.  Douche often.  Have a weak body  defense system (immune system).  Have been taking steroid medicines for a long time.  Frequently wear tight clothing. What are the signs or symptoms? Symptoms of this condition include:  White, thick, creamy vaginal discharge.  Swelling, itching, redness, and irritation of the vagina. The lips of the vagina (vulva) may be affected as well.  Pain or a burning feeling while urinating.  Pain during sex. How is this diagnosed? This  condition is diagnosed based on:  Your medical history.  A physical exam.  A pelvic exam. Your health care provider will examine a sample of your vaginal discharge under a microscope. Your health care provider may send this sample for testing to confirm the diagnosis. How is this treated? This condition is treated with medicine. Medicines may be over-the-counter or prescription. You may be told to use one or more of the following:  Medicine that is taken by mouth (orally).  Medicine that is applied as a cream (topically).  Medicine that is inserted directly into the vagina (suppository). Follow these instructions at home:  Lifestyle  Do not have sex until your health care provider approves. Tell your sex partner that you have a yeast infection. That person should go to his or her health care provider and ask if they should also be treated.  Do not wear tight clothes, such as pantyhose or tight pants.  Wear breathable cotton underwear. General instructions  Take or apply over-the-counter and prescription medicines only as told by your health care provider.  Eat more yogurt. This may help to keep your yeast infection from returning.  Do not use tampons until your health care provider approves.  Try taking a sitz bath to help with discomfort. This is a warm water bath that is taken while you are sitting down. The water should only come up to your hips and should cover your buttocks. Do this 3-4 times per day or as told by your health care provider.  Do not douche.  If you have diabetes, keep your blood sugar levels under control.  Keep all follow-up visits as told by your health care provider. This is important. Contact a health care provider if:  You have a fever.  Your symptoms go away and then return.  Your symptoms do not get better with treatment.  Your symptoms get worse.  You have new symptoms.  You develop blisters in or around your vagina.  You have blood  coming from your vagina and it is not your menstrual period.  You develop pain in your abdomen. Summary  Vaginal yeast infection is a condition that causes discharge as well as soreness, swelling, and redness (inflammation) of the vagina.  This condition is treated with medicine. Medicines may be over-the-counter or prescription.  Take or apply over-the-counter and prescription medicines only as told by your health care provider.  Do not douche. Do not have sex or use tampons until your health care provider approves.  Contact a health care provider if your symptoms do not get better with treatment or your symptoms go away and then return. This information is not intended to replace advice given to you by your health care provider. Make sure you discuss any questions you have with your health care provider. Document Revised: 08/15/2018 Document Reviewed: 06/03/2017 Elsevier Patient Education  [place].

## 2019-07-21 LAB — CULTURE, OB URINE: Culture: 10000 — AB

## 2019-07-27 ENCOUNTER — Telehealth (HOSPITAL_COMMUNITY): Payer: Self-pay | Admitting: *Deleted

## 2019-07-27 ENCOUNTER — Encounter (HOSPITAL_COMMUNITY): Payer: Self-pay | Admitting: *Deleted

## 2019-07-27 NOTE — Telephone Encounter (Signed)
Preadmission screen  

## 2019-07-28 ENCOUNTER — Other Ambulatory Visit: Payer: Self-pay | Admitting: Obstetrics and Gynecology

## 2019-07-30 ENCOUNTER — Encounter (HOSPITAL_COMMUNITY): Payer: Self-pay | Admitting: Obstetrics and Gynecology

## 2019-07-30 ENCOUNTER — Inpatient Hospital Stay (HOSPITAL_COMMUNITY)
Admission: AD | Admit: 2019-07-30 | Discharge: 2019-08-02 | DRG: 807 | Disposition: A | Discharge status: Home / Self Care | Payer: Managed Care, Other (non HMO) | Attending: Obstetrics and Gynecology | Admitting: Obstetrics and Gynecology

## 2019-07-30 ENCOUNTER — Other Ambulatory Visit: Payer: Self-pay

## 2019-07-30 DIAGNOSIS — Z3A36 36 weeks gestation of pregnancy: Secondary | ICD-10-CM

## 2019-07-30 DIAGNOSIS — Z20822 Contact with and (suspected) exposure to covid-19: Secondary | ICD-10-CM | POA: Diagnosis present

## 2019-07-30 DIAGNOSIS — Z8759 Personal history of other complications of pregnancy, childbirth and the puerperium: Secondary | ICD-10-CM

## 2019-07-30 DIAGNOSIS — O99824 Streptococcus B carrier state complicating childbirth: Secondary | ICD-10-CM | POA: Diagnosis present

## 2019-07-30 DIAGNOSIS — Z87891 Personal history of nicotine dependence: Secondary | ICD-10-CM

## 2019-07-30 DIAGNOSIS — Z880 Allergy status to penicillin: Secondary | ICD-10-CM

## 2019-07-30 DIAGNOSIS — O1414 Severe pre-eclampsia complicating childbirth: Principal | ICD-10-CM | POA: Diagnosis present

## 2019-07-30 DIAGNOSIS — O1413 Severe pre-eclampsia, third trimester: Secondary | ICD-10-CM | POA: Diagnosis not present

## 2019-07-30 DIAGNOSIS — O288 Other abnormal findings on antenatal screening of mother: Secondary | ICD-10-CM

## 2019-07-30 DIAGNOSIS — O1490 Unspecified pre-eclampsia, unspecified trimester: Secondary | ICD-10-CM | POA: Diagnosis present

## 2019-07-30 HISTORY — DX: Personal history of other complications of pregnancy, childbirth and the puerperium: Z87.59

## 2019-07-30 LAB — URINALYSIS, ROUTINE W REFLEX MICROSCOPIC
Bilirubin Urine: NEGATIVE
Glucose, UA: NEGATIVE mg/dL
Hgb urine dipstick: NEGATIVE
Ketones, ur: NEGATIVE mg/dL
Nitrite: NEGATIVE
Protein, ur: 30 mg/dL — AB
Specific Gravity, Urine: 1.013 (ref 1.005–1.030)
pH: 6 (ref 5.0–8.0)

## 2019-07-30 LAB — COMPREHENSIVE METABOLIC PANEL
ALT: 9 U/L (ref 0–44)
AST: 16 U/L (ref 15–41)
Albumin: 2.5 g/dL — ABNORMAL LOW (ref 3.5–5.0)
Alkaline Phosphatase: 149 U/L — ABNORMAL HIGH (ref 38–126)
Anion gap: 7 (ref 5–15)
BUN: <5 mg/dL — ABNORMAL LOW (ref 6–20)
CO2: 21 mmol/L — ABNORMAL LOW (ref 22–32)
Calcium: 8.2 mg/dL — ABNORMAL LOW (ref 8.9–10.3)
Chloride: 110 mmol/L (ref 98–111)
Creatinine, Ser: 0.72 mg/dL (ref 0.44–1.00)
GFR calc Af Amer: >60 mL/min (ref >60)
GFR calc non Af Amer: >60 mL/min (ref >60)
Glucose, Bld: 84 mg/dL (ref 70–99)
Potassium: 2.9 mmol/L — ABNORMAL LOW (ref 3.5–5.1)
Sodium: 138 mmol/L (ref 135–145)
Total Bilirubin: 0.6 mg/dL (ref 0.3–1.2)
Total Protein: 5.5 g/dL — ABNORMAL LOW (ref 6.5–8.1)

## 2019-07-30 LAB — URIC ACID: Uric Acid, Serum: 5 mg/dL (ref 2.5–7.1)

## 2019-07-30 LAB — CBC
HCT: 28.9 % — ABNORMAL LOW (ref 36.0–46.0)
Hemoglobin: 10.1 g/dL — ABNORMAL LOW (ref 12.0–15.0)
MCH: 30.2 pg (ref 26.0–34.0)
MCHC: 34.9 g/dL (ref 30.0–36.0)
MCV: 86.5 fL (ref 80.0–100.0)
Platelets: 200 10*3/uL (ref 150–400)
RBC: 3.34 MIL/uL — ABNORMAL LOW (ref 3.87–5.11)
RDW: 13.3 % (ref 11.5–15.5)
WBC: 15.2 10*3/uL — ABNORMAL HIGH (ref 4.0–10.5)
nRBC: 0 % (ref 0.0–0.2)

## 2019-07-30 LAB — TYPE AND SCREEN
ABO/RH(D): B POS
Antibody Screen: NEGATIVE

## 2019-07-30 LAB — ABO/RH: ABO/RH(D): B POS

## 2019-07-30 LAB — PROTEIN / CREATININE RATIO, URINE
Creatinine, Urine: 182.12 mg/dL
Protein Creatinine Ratio: 0.31 mg/mg{Cre} — ABNORMAL HIGH (ref 0.00–0.15)
Total Protein, Urine: 57 mg/dL

## 2019-07-30 LAB — SARS CORONAVIRUS 2 BY RT PCR (HOSPITAL ORDER, PERFORMED IN ~~LOC~~ HOSPITAL LAB): SARS Coronavirus 2: NEGATIVE

## 2019-07-30 MED ORDER — OXYTOCIN BOLUS FROM INFUSION: 333.0000 mL | Freq: Once | INTRAVENOUS | Status: DC

## 2019-07-30 MED ORDER — EPHEDRINE 5 MG/ML INJ: 10.0000 mg | INTRAVENOUS | Status: DC | PRN

## 2019-07-30 MED ORDER — LIDOCAINE HCL (PF) 1 % IJ SOLN: 30.0000 mL | INTRAMUSCULAR | Status: DC | PRN

## 2019-07-30 MED ORDER — LABETALOL HCL 5 MG/ML IV SOLN: 40.0000 mg | INTRAVENOUS | Status: DC | PRN

## 2019-07-30 MED ORDER — LACTATED RINGERS IV SOLN: 500.0000 mL | INTRAVENOUS | Status: DC | PRN

## 2019-07-30 MED ORDER — LABETALOL HCL 5 MG/ML IV SOLN: 20.0000 mg | INTRAVENOUS | Status: DC | PRN

## 2019-07-30 MED ORDER — PHENYLEPHRINE 40 MCG/ML (10ML) SYRINGE FOR IV PUSH (FOR BLOOD PRESSURE SUPPORT)
80.0000 ug | PREFILLED_SYRINGE | INTRAVENOUS | Status: DC | PRN
  Filled 2019-07-30: qty 10

## 2019-07-30 MED ORDER — CEFAZOLIN SODIUM-DEXTROSE 2-4 GM/100ML-% IV SOLN: 2.0000 g | Freq: Once | INTRAVENOUS | Status: DC

## 2019-07-30 MED ORDER — CEFAZOLIN SODIUM-DEXTROSE 1-4 GM/50ML-% IV SOLN: 1.0000 g | Freq: Three times a day (TID) | INTRAVENOUS | Status: DC

## 2019-07-30 MED ORDER — DIPHENHYDRAMINE HCL 50 MG/ML IJ SOLN
12.5000 mg | INTRAMUSCULAR | Status: DC | PRN
  Administered 2019-07-31: 12.5 mg via INTRAVENOUS
  Filled 2019-07-30: qty 1

## 2019-07-30 MED ORDER — ONDANSETRON HCL 4 MG/2ML IJ SOLN: 4.0000 mg | Freq: Four times a day (QID) | INTRAMUSCULAR | Status: DC | PRN

## 2019-07-30 MED ORDER — BETAMETHASONE SOD PHOS & ACET 6 (3-3) MG/ML IJ SUSP
12.0000 mg | Freq: Two times a day (BID) | INTRAMUSCULAR | Status: DC
  Administered 2019-07-30 – 2019-07-31 (×2): 12 mg via INTRAMUSCULAR
  Filled 2019-07-30: qty 5

## 2019-07-30 MED ORDER — LACTATED RINGERS IV SOLN: 500.0000 mL | Freq: Once | INTRAVENOUS | Status: DC

## 2019-07-30 MED ORDER — LACTATED RINGERS IV SOLN: INTRAVENOUS | Status: DC

## 2019-07-30 MED ORDER — SOD CITRATE-CITRIC ACID 500-334 MG/5ML PO SOLN: 30.0000 mL | ORAL | Status: DC | PRN

## 2019-07-30 MED ORDER — BUTALBITAL-APAP-CAFFEINE 50-325-40 MG PO TABS
1.0000 | ORAL_TABLET | Freq: Once | ORAL | Status: AC
  Administered 2019-07-30: 1 via ORAL
  Filled 2019-07-30: qty 1

## 2019-07-30 MED ORDER — FLEET ENEMA 7-19 GM/118ML RE ENEM: 1.0000 | ENEMA | RECTAL | Status: DC | PRN

## 2019-07-30 MED ORDER — LABETALOL HCL 5 MG/ML IV SOLN: 80.0000 mg | INTRAVENOUS | Status: DC | PRN

## 2019-07-30 MED ORDER — BUTALBITAL-APAP-CAFFEINE 50-325-40 MG PO TABS
2.0000 | ORAL_TABLET | Freq: Once | ORAL | Status: AC
  Administered 2019-07-30: 2 via ORAL
  Filled 2019-07-30: qty 2

## 2019-07-30 MED ORDER — MAGNESIUM SULFATE 40 GM/1000ML IV SOLN
2.0000 g/h | INTRAVENOUS | Status: DC
  Administered 2019-07-31: 2 g/h via INTRAVENOUS
  Filled 2019-07-30 (×2): qty 1000

## 2019-07-30 MED ORDER — MAGNESIUM SULFATE BOLUS VIA INFUSION
4.0000 g | Freq: Once | INTRAVENOUS | Status: AC
  Administered 2019-07-30: 4 g via INTRAVENOUS
  Filled 2019-07-30: qty 1000

## 2019-07-30 MED ORDER — FENTANYL-BUPIVACAINE-NACL 0.5-0.125-0.9 MG/250ML-% EP SOLN
12.0000 mL/h | EPIDURAL | Status: DC | PRN
  Filled 2019-07-30: qty 250

## 2019-07-30 MED ORDER — OXYTOCIN-SODIUM CHLORIDE 30-0.9 UT/500ML-% IV SOLN
2.5000 [IU]/h | INTRAVENOUS | Status: DC
  Administered 2019-07-31: 2.5 [IU]/h via INTRAVENOUS
  Filled 2019-07-30 (×2): qty 500

## 2019-07-30 MED ORDER — ACETAMINOPHEN 325 MG PO TABS: 650.0000 mg | ORAL_TABLET | ORAL | Status: DC | PRN

## 2019-07-30 MED ORDER — CLINDAMYCIN PHOSPHATE 900 MG/50ML IV SOLN
900.0000 mg | Freq: Three times a day (TID) | INTRAVENOUS | Status: DC
  Administered 2019-07-30 – 2019-07-31 (×2): 900 mg via INTRAVENOUS
  Filled 2019-07-30 (×2): qty 50

## 2019-07-30 MED ORDER — PHENYLEPHRINE 40 MCG/ML (10ML) SYRINGE FOR IV PUSH (FOR BLOOD PRESSURE SUPPORT): 80.0000 ug | PREFILLED_SYRINGE | INTRAVENOUS | Status: DC | PRN

## 2019-07-30 MED ORDER — FENTANYL CITRATE (PF) 100 MCG/2ML IJ SOLN
50.0000 ug | INTRAMUSCULAR | Status: DC | PRN
  Administered 2019-07-31: 50 ug via INTRAVENOUS
  Filled 2019-07-30: qty 2

## 2019-07-30 NOTE — MAU Provider Note (Signed)
History     CSN: [id number]  Arrival date and time: 07/30/19 1523   First Provider Initiated Contact with Patient 07/30/19 1623      Chief Complaint  Patient presents with  . Headache   29 y.o. G1 @36 .4 wks with recent dx of PEC presenting with HA. HA started earlier today. Located temporal and occipital. Rates pain 5/10. She took Tylenol but it didn't help. Denies visual disturbances, RUQ pain, CP, and SOB. Reports good FM. No VB, LOF, or ctx. Her pregnancy has also been complicated by GBS bacterieuria.  OB History    Gravida  1   Para      Term      Preterm      AB      Living  0     SAB      TAB      Ectopic      Multiple      Live Births              Past Medical History:  Diagnosis Date  . Ankylosing spondylitis (HCC)   . Asthma   . Fibromyalgia   . Kidney stones   . Migraine   . Ovarian cyst   . Pregnancy induced hypertension     Past Surgical History:  Procedure Laterality Date  . COLONOSCOPY    . ESOPHAGOGASTRODUODENOSCOPY ENDOSCOPY    . spinal tap    . WISDOM TOOTH EXTRACTION    . WISDOM TOOTH EXTRACTION  2010    Family History  Problem Relation Age of Onset  . Arthritis Mother   . Hypertension Maternal Grandmother   . Thyroid disease Maternal Grandmother   . Kidney Stones Maternal Grandfather     Social History   Tobacco Use  . Smoking status: Former Smoker    Quit date: 2014    Years since quitting: 7.5  . Smokeless tobacco: Never Used  Vaping Use  . Vaping Use: Never used  Substance Use Topics  . Alcohol use: Yes    Alcohol/week: 0.0 standard drinks    Comment: occassionally wine  every other weekend  . Drug use: No    Allergies:  Allergies  Allergen Reactions  . Amoxicillin Hives, Shortness Of Breath and Swelling    Swelling of hands, face, and lip Has patient had a PCN reaction causing immediate rash, facial/tongue/throat swelling, SOB or lightheadedness with hypotension: Unknown Has patient had a PCN reaction  causing severe rash involving mucus membranes or skin necrosis: Yes Has patient had a PCN reaction that required hospitalization: No Has patient had a PCN reaction occurring within the last 10 years: No If all of the above answers are "NO", then may proceed with Cephalosporin use.   2015 Zofran [Ondansetron Hcl] Nausea Only    Medications Prior to Admission  Medication Sig Dispense Refill Last Dose  . acetaminophen (TYLENOL) 500 MG tablet Take 500 mg by mouth every 6 (six) hours as needed for moderate pain.   07/30/2019 at Unknown time  . albuterol (PROVENTIL HFA;VENTOLIN HFA) 108 (90 BASE) MCG/ACT inhaler Inhale 2 puffs into the lungs every 6 (six) hours as needed for wheezing or shortness of breath.   Past Month at Unknown time  . fluticasone (FLOVENT DISKUS) 50 MCG/BLIST diskus inhaler Inhale 2 puffs into the lungs 2 (two) times daily.    07/30/2019 at Unknown time  . mometasone (NASONEX) 50 MCG/ACT nasal spray Place 2 sprays into the nose 2 (two) times daily.    07/30/2019 at  Unknown time  . Prenatal Vit-Fe Fumarate-FA (PRENATAL MULTIVITAMIN) TABS tablet Take 1 tablet by mouth daily at 12 noon.   07/30/2019 at Unknown time  . terconazole (TERAZOL 7) 0.4 % vaginal cream Place 1 applicator vaginally at bedtime. Use for seven days 45 g 0     Review of Systems  Eyes: Negative for visual disturbance.  Respiratory: Negative for shortness of breath.   Cardiovascular: Negative for chest pain.  Gastrointestinal: Negative for abdominal pain.  Neurological: Positive for headaches.   Physical Exam   Blood pressure (!) 141/97, pulse (!) 103, temperature 98.4 F (36.9 C), temperature source Oral, resp. rate 18, height 5' (1.524 m), weight 78.3 kg, last menstrual period 11/16/2018, SpO2 99 %. Patient Vitals for the past 24 hrs:  BP Temp Temp src Pulse Resp SpO2 Height Weight  07/30/19 1645 (!) 146/90 -- -- 99 -- 99 % -- --  07/30/19 1631 (!) 143/94 -- -- 97 -- -- -- --  07/30/19 1616 (!) 141/97 -- -- (!)  103 -- -- -- --  07/30/19 1615 -- -- -- -- -- 99 % -- --  07/30/19 1600 (!) 143/94 -- -- 100 -- 99 % -- --  07/30/19 1545 (!) 140/95 98.4 F (36.9 C) Oral (!) 104 18 99 % -- --  07/30/19 1539 -- -- -- -- -- -- 5' (1.524 m) 78.3 kg   Physical Exam Vitals and nursing note reviewed.  Constitutional:      General: She is not in acute distress.    Appearance: She is well-developed.  HENT:     Head: Normocephalic.  Pulmonary:     Effort: Pulmonary effort is normal. No respiratory distress.  Musculoskeletal:        General: Normal range of motion.  Neurological:     Mental Status: She is alert.  Psychiatric:        Mood and Affect: Mood normal.   EFM: 150 bpm, mod variability, + accels, no decels Toco: none  No results found for this or any previous visit (from the past 24 hour(s)).  MAU Course  Procedures Fioricet  MDM Labs ordered. Recommend admission and IOL d/t PEC now with SF (HA). Plan for admit. [place], CNM and Dr. Normand Sloop notified.   Assessment and Plan   1. Severe pre-eclampsia in third trimester   2. [redacted] weeks gestation of pregnancy   3. NST (non-stress test) nonreactive    Admit to LD Mngt per primary OB  Donette Larry, CNM 07/30/2019, 4:35 PM

## 2019-07-30 NOTE — MAU Note (Signed)
Presents with c/o H/A, unrelieved with Tylenol, last took Tylenol @ 1115.   Denies visual disturbances or epigastric pain.  Endorses +FM.

## 2019-07-30 NOTE — H&P (Addendum)
OB ADMISSION/ HISTORY & PHYSICAL:  Admission Date: 07/30/2019  3:23 PM  Admit Diagnosis: Pre eclampsia with severe features  Angelica Kim is a 29 y.o. female G1P0 [redacted]w[redacted]d presenting for HA not relieved w/ Tylenol. Endorses active FM, denies LOF and vaginal bleeding. Denies visual changes and epigastric pain. Dx w/ pre eclampsia 6/20. Treated w/ Fioricet in MAU for HA 6/10 and reduced to 3/10, reports a gradual increase in stabbing pain 1 hour after Fioricet.    History of current pregnancy: G1P0   Patient entered care with [facility] at 11+1 wks.   EDC of 08/23/19 was established by LMP and congruent w/ 18 wk U/S.   Last evaluation: 35+3wks VTX, POSTERIOR PLACENTA, AFI 13.7, EFW 5+8, 29%ILE, LAGGING HC 10%ILE, FL 12%ILE, BPP 8/8  Significant prenatal events:  Patient Active Problem List   Diagnosis Date Noted  . Generalized anxiety disorder 04/07/2014  . Migraines 12/21/2013  . Low back pain 12/21/2013  . Cognitive changes 12/21/2013    Prenatal Labs: ABO, Rh: B/Positive/-- (12/07 0000) Antibody: Negative (12/07 0000) Rubella: Immune (12/07 0000)  RPR: Nonreactive (12/07 0000)  HBsAg: Negative (12/07 0000)  HIV: Non-reactive (12/07 0000)  GTT: passed 1 hr GBS:   positive GC/CHL: PENDING Genetics: Declined     OB History  Gravida Para Term Preterm AB Living  1         0  SAB TAB Ectopic Multiple Live Births               # Outcome Date GA Lbr Len/2nd Weight Sex Delivery Anes PTL Lv  1 Current             Medical / Surgical History: Past medical history:  Past Medical History:  Diagnosis Date  . Ankylosing spondylitis (HCC)   . Asthma   . Fibromyalgia   . Kidney stones   . Migraine   . Ovarian cyst   . Pregnancy induced hypertension     Past surgical history:  Past Surgical History:  Procedure Laterality Date  . COLONOSCOPY    . ESOPHAGOGASTRODUODENOSCOPY ENDOSCOPY    . spinal tap    . WISDOM TOOTH EXTRACTION    . WISDOM TOOTH EXTRACTION  2010   Family History:   Family History  Problem Relation Age of Onset  . Arthritis Mother   . Hypertension Maternal Grandmother   . Thyroid disease Maternal Grandmother   . Kidney Stones Maternal Grandfather     Social History:  reports that she quit smoking about 7 years ago. She has never used smokeless tobacco. She reports current alcohol use. She reports that she does not use drugs.  Allergies: Amoxicillin and Zofran [ondansetron hcl]   Current Medications at time of admission:  Prior to Admission medications   Medication Sig Start Date End Date Taking? Authorizing Provider  acetaminophen (TYLENOL) 500 MG tablet Take 500 mg by mouth every 6 (six) hours as needed for moderate pain.   Yes [provider]  albuterol (PROVENTIL HFA;VENTOLIN HFA) 108 (90 BASE) MCG/ACT inhaler Inhale 2 puffs into the lungs every 6 (six) hours as needed for wheezing or shortness of breath.   Yes [provider]  fluticasone (FLOVENT DISKUS) 50 MCG/BLIST diskus inhaler Inhale 2 puffs into the lungs 2 (two) times daily.    Yes [provider]  mometasone (NASONEX) 50 MCG/ACT nasal spray Place 2 sprays into the nose 2 (two) times daily.    Yes [provider]  Prenatal Vit-Fe Fumarate-FA (PRENATAL MULTIVITAMIN) TABS tablet Take  1 tablet by mouth daily at 12 noon.   Yes [provider]  terconazole (TERAZOL 7) 0.4 % vaginal cream Place 1 applicator vaginally at bedtime. Use for seven days 07/20/19   Judeth Horn, NP  metoCLOPramide (REGLAN) 10 MG tablet Take 1 tablet (10 mg total) by mouth every 6 (six) hours. 03/22/17 07/31/18  [name], Mia A, PA-C    Review of Systems: Constitutional: Negative   HENT: Negative   Eyes: Negative   Respiratory: Negative   Cardiovascular: Negative   Gastrointestinal: Negative  Genitourinary: neg for bloody show, neg for LOF   Musculoskeletal: Negative   Skin: Negative   Neurological: Negative   Endo/Heme/Allergies: Negative   Psychiatric/Behavioral:  Negative    Physical Exam: VS: Blood pressure (!) 140/93, pulse 97, temperature 98.4 F (36.9 C), temperature source Oral, resp. rate 18, height 5' (1.524 m), weight 78.3 kg, last menstrual period 11/16/2018, SpO2 100 %. AAO x3, no signs of distress Cardiovascular: RRR Respiratory: Lung fields clear to ausculation GU/GI: Abdomen gravid, non-tender, non-distended, active FM, vertex Extremities: 1+ non-pitting edema, negative for pain, tenderness, and cords  Cervical exam:Dilation: 2 Effacement (%): 50 Station: -3 Exam by:: Lizbeth Bark, CNM FHR: baseline rate 135 / variability moderate / accelerations present / absent decelerations TOCO: irreg   Prenatal Transfer Tool  Maternal Diabetes: No Genetic Screening: Declined Maternal Ultrasounds/Referrals: Normal Fetal Ultrasounds or other Referrals:  None Maternal Substance Abuse:  No Significant Maternal Medications:  None Significant Maternal Lab Results: Group B Strep positive    Assessment: 29 y.o. G1P0 [redacted]w[redacted]d IOL for pre eclampsia w/ severe features  FHR category 1 GBS positive Pain management plan: epidural   Plan:  Admit to L&D Routine admission orders Epidural PRN Betamethasone 12 mg now and repeat in 12 hours Clindamycin for GBS prophylaxis MgSO4 4 g bolus then 2G/hr maintenance  Dr Su Hilt aware of admission and plan of care  [name] MSN, CNM 07/30/2019 5:54 PM

## 2019-07-31 ENCOUNTER — Inpatient Hospital Stay (HOSPITAL_COMMUNITY): Payer: Managed Care, Other (non HMO) | Admitting: Anesthesiology

## 2019-07-31 ENCOUNTER — Encounter (HOSPITAL_COMMUNITY): Payer: Self-pay | Admitting: Obstetrics and Gynecology

## 2019-07-31 ENCOUNTER — Other Ambulatory Visit (HOSPITAL_COMMUNITY): Payer: Managed Care, Other (non HMO)

## 2019-07-31 LAB — CBC
HCT: 32.6 % — ABNORMAL LOW (ref 36.0–46.0)
Hemoglobin: 11.4 g/dL — ABNORMAL LOW (ref 12.0–15.0)
MCH: 30.2 pg (ref 26.0–34.0)
MCHC: 35 g/dL (ref 30.0–36.0)
MCV: 86.5 fL (ref 80.0–100.0)
Platelets: 247 10*3/uL (ref 150–400)
RBC: 3.77 MIL/uL — ABNORMAL LOW (ref 3.87–5.11)
RDW: 13.5 % (ref 11.5–15.5)
WBC: 37.3 10*3/uL — ABNORMAL HIGH (ref 4.0–10.5)
nRBC: 0 % (ref 0.0–0.2)

## 2019-07-31 LAB — GC/CHLAMYDIA PROBE AMP (~~LOC~~) NOT AT ARMC
Chlamydia: NEGATIVE
Comment: NEGATIVE
Comment: NORMAL
Neisseria Gonorrhea: NEGATIVE

## 2019-07-31 LAB — CBC WITH DIFFERENTIAL/PLATELET
Abs Immature Granulocytes: 0.14 10*3/uL — ABNORMAL HIGH (ref 0.00–0.07)
Basophils Absolute: 0.1 10*3/uL (ref 0.0–0.1)
Basophils Relative: 0 %
Eosinophils Absolute: 0 10*3/uL (ref 0.0–0.5)
Eosinophils Relative: 0 %
HCT: 31.9 % — ABNORMAL LOW (ref 36.0–46.0)
Hemoglobin: 11.3 g/dL — ABNORMAL LOW (ref 12.0–15.0)
Immature Granulocytes: 1 %
Lymphocytes Relative: 8 %
Lymphs Abs: 1.5 10*3/uL (ref 0.7–4.0)
MCH: 30.9 pg (ref 26.0–34.0)
MCHC: 35.4 g/dL (ref 30.0–36.0)
MCV: 87.2 fL (ref 80.0–100.0)
Monocytes Absolute: 0.3 10*3/uL (ref 0.1–1.0)
Monocytes Relative: 2 %
Neutro Abs: 17.4 10*3/uL — ABNORMAL HIGH (ref 1.7–7.7)
Neutrophils Relative %: 89 %
Platelets: 241 10*3/uL (ref 150–400)
RBC: 3.66 MIL/uL — ABNORMAL LOW (ref 3.87–5.11)
RDW: 13.5 % (ref 11.5–15.5)
WBC: 19.4 10*3/uL — ABNORMAL HIGH (ref 4.0–10.5)
nRBC: 0 % (ref 0.0–0.2)

## 2019-07-31 LAB — COMPREHENSIVE METABOLIC PANEL
ALT: 11 U/L (ref 0–44)
AST: 18 U/L (ref 15–41)
Albumin: 2.9 g/dL — ABNORMAL LOW (ref 3.5–5.0)
Alkaline Phosphatase: 188 U/L — ABNORMAL HIGH (ref 38–126)
Anion gap: 8 (ref 5–15)
BUN: <5 mg/dL — ABNORMAL LOW (ref 6–20)
CO2: 21 mmol/L — ABNORMAL LOW (ref 22–32)
Calcium: 8 mg/dL — ABNORMAL LOW (ref 8.9–10.3)
Chloride: 108 mmol/L (ref 98–111)
Creatinine, Ser: 0.67 mg/dL (ref 0.44–1.00)
GFR calc Af Amer: >60 mL/min (ref >60)
GFR calc non Af Amer: >60 mL/min (ref >60)
Glucose, Bld: 131 mg/dL — ABNORMAL HIGH (ref 70–99)
Potassium: 3.2 mmol/L — ABNORMAL LOW (ref 3.5–5.1)
Sodium: 137 mmol/L (ref 135–145)
Total Bilirubin: 0.9 mg/dL (ref 0.3–1.2)
Total Protein: 6.1 g/dL — ABNORMAL LOW (ref 6.5–8.1)

## 2019-07-31 LAB — RPR: RPR Ser Ql: NONREACTIVE

## 2019-07-31 MED ORDER — DIBUCAINE (PERIANAL) 1 % EX OINT: 1.0000 "application " | TOPICAL_OINTMENT | CUTANEOUS | Status: DC | PRN

## 2019-07-31 MED ORDER — PRENATAL MULTIVITAMIN CH
1.0000 | ORAL_TABLET | Freq: Every day | ORAL | Status: DC
  Administered 2019-08-01: 1 via ORAL

## 2019-07-31 MED ORDER — WITCH HAZEL-GLYCERIN EX PADS: 1.0000 "application " | MEDICATED_PAD | CUTANEOUS | Status: DC | PRN

## 2019-07-31 MED ORDER — DIPHENHYDRAMINE HCL 25 MG PO CAPS: 25.0000 mg | ORAL_CAPSULE | Freq: Four times a day (QID) | ORAL | Status: DC | PRN

## 2019-07-31 MED ORDER — SIMETHICONE 80 MG PO CHEW: 80.0000 mg | CHEWABLE_TABLET | ORAL | Status: DC | PRN

## 2019-07-31 MED ORDER — TETANUS-DIPHTH-ACELL PERTUSSIS 5-2.5-18.5 LF-MCG/0.5 IM SUSP: 0.5000 mL | Freq: Once | INTRAMUSCULAR | Status: DC

## 2019-07-31 MED ORDER — COCONUT OIL OIL: 1.0000 "application " | TOPICAL_OIL | Status: DC | PRN

## 2019-07-31 MED ORDER — HYDRALAZINE HCL 20 MG/ML IJ SOLN: 10.0000 mg | INTRAMUSCULAR | Status: DC | PRN

## 2019-07-31 MED ORDER — IBUPROFEN 600 MG PO TABS
600.0000 mg | ORAL_TABLET | Freq: Four times a day (QID) | ORAL | Status: DC
  Administered 2019-08-01 – 2019-08-02 (×6): 600 mg via ORAL
  Filled 2019-07-31 (×6): qty 1

## 2019-07-31 MED ORDER — HYDRALAZINE HCL 20 MG/ML IJ SOLN: 5.0000 mg | INTRAMUSCULAR | Status: DC | PRN

## 2019-07-31 MED ORDER — ACETAMINOPHEN 325 MG PO TABS: 650.0000 mg | ORAL_TABLET | ORAL | Status: DC | PRN

## 2019-07-31 MED ORDER — OXYTOCIN-SODIUM CHLORIDE 30-0.9 UT/500ML-% IV SOLN
1.0000 m[IU]/min | INTRAVENOUS | Status: DC
  Administered 2019-07-31: 2 m[IU]/min via INTRAVENOUS

## 2019-07-31 MED ORDER — LABETALOL HCL 5 MG/ML IV SOLN: 40.0000 mg | INTRAVENOUS | Status: DC | PRN

## 2019-07-31 MED ORDER — TERBUTALINE SULFATE 1 MG/ML IJ SOLN: 0.2500 mg | Freq: Once | INTRAMUSCULAR | Status: DC | PRN

## 2019-07-31 MED ORDER — ONDANSETRON HCL 4 MG/2ML IJ SOLN: 4.0000 mg | INTRAMUSCULAR | Status: DC | PRN

## 2019-07-31 MED ORDER — MAGNESIUM SULFATE 40 GM/1000ML IV SOLN
2.0000 g/h | INTRAVENOUS | Status: AC
  Administered 2019-08-01: 2 g/h via INTRAVENOUS
  Filled 2019-07-31: qty 1000

## 2019-07-31 MED ORDER — ZOLPIDEM TARTRATE 5 MG PO TABS: 5.0000 mg | ORAL_TABLET | Freq: Every evening | ORAL | Status: DC | PRN

## 2019-07-31 MED ORDER — SODIUM CHLORIDE (PF) 0.9 % IJ SOLN
INTRAMUSCULAR | Status: DC | PRN
  Administered 2019-07-31: 12 mL/h via EPIDURAL

## 2019-07-31 MED ORDER — ONDANSETRON HCL 4 MG PO TABS: 4.0000 mg | ORAL_TABLET | ORAL | Status: DC | PRN

## 2019-07-31 MED ORDER — SENNOSIDES-DOCUSATE SODIUM 8.6-50 MG PO TABS
2.0000 | ORAL_TABLET | ORAL | Status: DC
  Administered 2019-08-01 – 2019-08-02 (×2): 2 via ORAL
  Filled 2019-07-31 (×2): qty 2

## 2019-07-31 MED ORDER — LACTATED RINGERS IV SOLN: INTRAVENOUS | Status: DC

## 2019-07-31 MED ORDER — BENZOCAINE-MENTHOL 20-0.5 % EX AERO: 1.0000 "application " | INHALATION_SPRAY | CUTANEOUS | Status: DC | PRN

## 2019-07-31 MED ORDER — LABETALOL HCL 5 MG/ML IV SOLN: 20.0000 mg | INTRAVENOUS | Status: DC | PRN

## 2019-07-31 MED ORDER — LIDOCAINE HCL (PF) 1 % IJ SOLN
INTRAMUSCULAR | Status: DC | PRN
  Administered 2019-07-31: 7 mL via EPIDURAL
  Administered 2019-07-31: 3 mL via EPIDURAL

## 2019-07-31 NOTE — Anesthesia Preprocedure Evaluation (Signed)
Anesthesia Evaluation  Patient identified by MRN, date of birth, ID band Patient awake    Reviewed: Allergy & Precautions, H&P , NPO status , Patient's Chart, lab work & pertinent test results  History of Anesthesia Complications Negative for: history of anesthetic complications  Airway Mallampati: II  TM Distance: >3 FB Neck ROM: full    Dental no notable dental hx.    Pulmonary asthma , former smoker,    Pulmonary exam normal        Cardiovascular hypertension (pre-e on mag), Normal cardiovascular exam Rhythm:regular Rate:Normal     Neuro/Psych negative neurological ROS  negative psych ROS   GI/Hepatic negative GI ROS, Neg liver ROS,   Endo/Other  negative endocrine ROS  Renal/GU negative Renal ROS  negative genitourinary   Musculoskeletal  (+) Arthritis  (ankylosing spondylitis), Fibromyalgia -  Abdominal   Peds  Hematology negative hematology ROS (+)   Anesthesia Other Findings   Reproductive/Obstetrics (+) Pregnancy                             Anesthesia Physical Anesthesia Plan  ASA: II  Anesthesia Plan: Epidural   Post-op Pain Management:    Induction:   PONV Risk Score and Plan:   Airway Management Planned:   Additional Equipment:   Intra-op Plan:   Post-operative Plan:   Informed Consent: I have reviewed the patients History and Physical, chart, labs and discussed the procedure including the risks, benefits and alternatives for the proposed anesthesia with the patient or authorized representative who has indicated his/her understanding and acceptance.       Plan Discussed with:   Anesthesia Plan Comments:         Anesthesia Quick Evaluation

## 2019-07-31 NOTE — Progress Notes (Signed)
Subjective:    Cervical balloon spont expelled.   Objective:    VS: BP 134/82   Pulse 91   Temp 98.6 F (37 C) (Oral)   Resp 18   Ht 5' (1.524 m)   Wt 78.3 kg   LMP 11/16/2018   SpO2 99%   BMI 33.73 kg/m  FHR : baseline 135 / variability moderate / accelerations present / absent decelerations Toco: contractions every 2-4 minutes  Membranes: intact Dilation: 4.5 Effacement (%): 60 Station: -2 Presentation: Vertex Exam by:: K.Cowher RN  Assessment/Plan:   29 y.o. G1P0 [redacted]w[redacted]d Pre eclampsia w/ severe features  Labor: Cervical balloon spont expelled, increase Pitocin per protocol Preeclampsia:  on magnesium sulfate and no signs or symptoms of toxicity Fetal Wellbeing:  Category I Pain Control:  plans epidural I/D:  GBS neg Anticipated MOD:  NSVD  [name] MSN, CNM 07/31/2019 3:52 AM

## 2019-07-31 NOTE — Anesthesia Procedure Notes (Signed)
Epidural Patient location during procedure: OB Start time: 07/31/2019 3:25 AM End time: 07/31/2019 3:34 AM  Staffing Anesthesiologist: Lucretia Kern, MD Performed: anesthesiologist   Preanesthetic Checklist Completed: patient identified, IV checked, risks and benefits discussed, monitors and equipment checked, pre-op evaluation and timeout performed  Epidural Patient position: sitting Prep: DuraPrep Patient monitoring: heart rate, continuous pulse ox and blood pressure Approach: midline Location: L3-L4 Injection technique: LOR air  Needle:  Needle type: Tuohy  Needle gauge: 17 G Needle length: 9 cm Needle insertion depth: 7 cm Catheter type: closed end flexible Catheter size: 19 Gauge Catheter at skin depth: 12 cm Test dose: negative  Assessment Events: blood not aspirated, injection not painful, no injection resistance, no paresthesia and negative IV test  Additional Notes Reason for block:procedure for pain

## 2019-07-31 NOTE — Progress Notes (Addendum)
Subjective:    Discussed plan to insert cervical balloon and pt agrees. Plans epidural for pain management.   Objective:    VS: BP 139/87   Pulse 92   Temp 98.6 F (37 C) (Oral)   Resp 18   Ht 5' (1.524 m)   Wt 78.3 kg   LMP 11/16/2018   SpO2 100%   BMI 33.73 kg/m  FHR : baseline 135 / variability moderate / accelerations present / absent decelerations Toco: contractions irreg Membranes: intact Dilation: 4.5 Effacement (%): 60 Station: -2 Presentation: Vertex Exam by:: K.Cowher RN  Assessment/Plan:   29 y.o. G1P0 [redacted]w[redacted]d Pre eclampsia w/ severe features  Labor: cervical balloon inserted w/o diff and pt tolerated well, will add low-dose Pitocin Preeclampsia:  on magnesium sulfate and labs stable Fetal Wellbeing:  Category I Pain Control:  plans epidural I/D:  GBS positive Anticipated MOD:  NSVD  [name] MSN, CNM 07/31/2019 3:46 AM

## 2019-07-31 NOTE — Progress Notes (Signed)
Labor Progress Note  Angelica Kim is a 29 y.o. female G1P0 [redacted]w[redacted]d on 7/1 presenting for HA not relieved w/ Tylenol. Treated w/ Fioricet in MAU for HA 6/10 and reduced to 3/10, reports a gradual increase in stabbing pain 1 hour after Fioricet. Endorses active FM, denies LOF and vaginal bleeding. Denies visual changes and epigastric pain.  Dx w/ pre eclampsia 6/20, PCR was 0.33, other labs unremarkable, received 4gm loading bolus of magnesium and continues with 2gm/hr.   History of current pregnancy: G1P0   Patient entered care with [facility] at 11+1 wks.  EDC of7/25/21was established by LMP and congruent w/ 18 wk U/S.  Last evaluation:35+3wksVTX, POSTERIOR PLACENTA, AFI 13.7, EFW 5+8, 29%ILE, LAGGING HC 10%ILE, FL 12%ILE, BPP 8/8  Subjective: Pt in bed comfortable post epidural placement. Pt teary eyed and talked about being exhausted, pt aggravated with length of induction process, discussed induction process, with pitocin, mag, and AROM, and IUPC, and epidural, and cervical check, pt endorses desirer to take a nap, anxiety medication was offered to pt, pt will call out if she feels she needs anything.  Patient Active Problem List   Diagnosis Date Noted  . Preeclampsia, severe 07/30/2019  . Generalized anxiety disorder 04/07/2014  . Migraines 12/21/2013  . Low back pain 12/21/2013  . Cognitive changes 12/21/2013   Objective: BP 130/89   Pulse 93   Temp 97.8 F (36.6 C) (Oral)   Resp 20   Ht 5' (1.524 m)   Wt 78.3 kg   LMP 11/16/2018   SpO2 99%   BMI 33.73 kg/m  I/O last 3 completed shifts: In: 6242.6 [P.O.:4550; I.V.:1692.6] Out: 5525 [Urine:5525] No intake/output data recorded. NST: FHR baseline 120 bpm, Variability: moderate, Accelerations:present, Decelerations:  Absent= Cat 1/Reactive CTX:  regular, every 2-3 minutes, lasting 60 seconds Uterus gravid, soft non tender, moderate to palpate with contractions.  SVE:  Dilation: 4 Effacement (%): 90 Station: -1 Exam by:: [employer], CNM Pitocin at (16) mUn/min IUPC in place.   Assessment:  Angelica Kim is a 29 y.o. female G1P0 [redacted]w[redacted]d on 7/1 presenting for HA not relieved w/ Tylenol. Treated w/ Fioricet in MAU for HA 6/10 and reduced to 3/10, reports a gradual increase in stabbing pain 1 hour after Fioricet. Endorses active FM, denies LOF and vaginal bleeding. Denies visual changes and epigastric pain.  Dx w/ pre eclampsia 6/20, PCR was 0.33, other labs unremarkable, received 4gm loading bolus of magnesium and continues with 2gm/hr. Currently progressing in latent labor, cervix feels tight. Labor just now adequate.   History of current pregnancy: G1P0   Patient entered care with [facility] at 11+1 wks.  EDC of7/25/21was established by LMP and congruent w/ 18 wk U/S.  Last evaluation:35+3wksVTX, POSTERIOR PLACENTA, AFI 13.7, EFW 5+8, 29%ILE, LAGGING HC 10%ILE, FL 12%ILE, BPP 8/8 Patient Active Problem List   Diagnosis Date Noted  . Preeclampsia, severe 07/30/2019  . Generalized anxiety disorder 04/07/2014  . Migraines 12/21/2013  . Low back pain 12/21/2013  . Cognitive changes 12/21/2013   NICHD: Category 1  Preterm Induction: BMZ given 7/1 & 7/2  PreE with SF: BP upon admission was 150-140s/80s,  currently 130/89, asymptomatic now, had HA upon  admission, 4gm bolus mag s/p on 7/1 @ 1837,  continues on 2gm/hr. PCR 0.33, labs unremarkable.   Membranes:  AROM, clear 7/2 @ 0500 x 4hrs, no s/s of  Infection  IUPC: MVUs 170-200s  Induction:    Cytotec x  Foley Bulb: inserted  7/2 @ 0346, out  7/2 @ 0222  Pitocin - 16  Pain management:               IV pain management: x fentanyl @ 0007             Epidural placement:  at 0330 on 7/2  GBS Positvie  Abx: clinda due to penicillin allergy @ 2056 on 7/1 &  0512 on 7/2, undetermined susceptibility noted, change  abx to vancomycin for coverage.  Plan: Continue labor plan GBS+: Give vancomycin, obtain placental cultures at birth.  PReE with AF: Continue on  mag 2gm/hr, neuro checks, redraw labs in morning, plan for 24 hours PP mag.  Continuous/intermittent monitoring Rest Ambulate Frequent position changes to facilitate fetal rotation and descent. Will reassess with cervical exam at 1330 or earlier if necessary Continue pitocin per protocol to target MVUs of 200-250 Anticipate labor progression and vaginal delivery.   Md Dillard aware of plan and verbalized agreement.   Dale Lemitar, NP-C, CNM, MSN 07/31/2019. 10:57 AM

## 2019-08-01 ENCOUNTER — Other Ambulatory Visit (HOSPITAL_COMMUNITY): Payer: Managed Care, Other (non HMO)

## 2019-08-01 LAB — COMPREHENSIVE METABOLIC PANEL
ALT: 12 U/L (ref 0–44)
AST: 27 U/L (ref 15–41)
Albumin: 2.4 g/dL — ABNORMAL LOW (ref 3.5–5.0)
Alkaline Phosphatase: 158 U/L — ABNORMAL HIGH (ref 38–126)
Anion gap: 11 (ref 5–15)
BUN: <5 mg/dL — ABNORMAL LOW (ref 6–20)
CO2: 21 mmol/L — ABNORMAL LOW (ref 22–32)
Calcium: 7.4 mg/dL — ABNORMAL LOW (ref 8.9–10.3)
Chloride: 105 mmol/L (ref 98–111)
Creatinine, Ser: 0.88 mg/dL (ref 0.44–1.00)
GFR calc Af Amer: >60 mL/min (ref >60)
GFR calc non Af Amer: >60 mL/min (ref >60)
Glucose, Bld: 162 mg/dL — ABNORMAL HIGH (ref 70–99)
Potassium: 2.8 mmol/L — ABNORMAL LOW (ref 3.5–5.1)
Sodium: 137 mmol/L (ref 135–145)
Total Bilirubin: 0.7 mg/dL (ref 0.3–1.2)
Total Protein: 5.1 g/dL — ABNORMAL LOW (ref 6.5–8.1)

## 2019-08-01 LAB — CBC
HCT: 29.7 % — ABNORMAL LOW (ref 36.0–46.0)
Hemoglobin: 10.4 g/dL — ABNORMAL LOW (ref 12.0–15.0)
MCH: 30.7 pg (ref 26.0–34.0)
MCHC: 35 g/dL (ref 30.0–36.0)
MCV: 87.6 fL (ref 80.0–100.0)
Platelets: 248 10*3/uL (ref 150–400)
RBC: 3.39 MIL/uL — ABNORMAL LOW (ref 3.87–5.11)
RDW: 13.7 % (ref 11.5–15.5)
WBC: 33.8 10*3/uL — ABNORMAL HIGH (ref 4.0–10.5)
nRBC: 0 % (ref 0.0–0.2)

## 2019-08-01 LAB — LACTATE DEHYDROGENASE: LDH: 197 U/L — ABNORMAL HIGH (ref 98–192)

## 2019-08-01 LAB — URIC ACID: Uric Acid, Serum: 4.9 mg/dL (ref 2.5–7.1)

## 2019-08-01 NOTE — Progress Notes (Signed)
PPD # 1 S/P NSVD IOL for PEC w/ SF, on Mag Sulfate x 24 Hrs PP, due for DC ar 1530 Live born female  Birth Weight: 5 lb 12.9 oz (2634 g) APGAR: 8, 9  Newborn Delivery   Birth date/time: 07/31/2019 15:24:00 Delivery type: Vaginal, Spontaneous     Baby name: [name] Delivering provider: [facility]  Episiotomy:None   Lacerations:2nd degree   Feeding: breast  Pain control at delivery: Epidural   S:  Reports feeling well but tired on Mag Sulfate             Tolerating po/ No nausea or vomiting             Bleeding is light             Pain controlled with ibuprofen (OTC)             Up ad lib / ambulatory / voiding without difficulties   O:  A & O x 3, in no apparent distress              VS:  Patient Vitals for the past 24 hrs:  BP Temp Temp src Pulse Resp SpO2  08/01/19 0845 129/82 98.1 F (36.7 C) Oral 80 18 100 %  08/01/19 0655 -- -- -- -- 17 --  08/01/19 0555 -- (!) 97.4 F (36.3 C) Oral -- 18 98 %  08/01/19 0500 -- -- -- -- 17 --  08/01/19 0400 -- -- -- -- 18 --  08/01/19 0300 -- -- -- -- 17 --  08/01/19 0214 139/74 -- -- 85 -- --  08/01/19 0213 -- -- -- -- 18 --  08/01/19 0101 123/65 -- -- 81 17 --  08/01/19 0012 (!) 141/83 97.9 F (36.6 C) Oral 86 18 98 %  07/31/19 2300 129/83 -- -- 89 17 --  07/31/19 2212 (!) 141/85 -- Oral 87 17 100 %  07/31/19 2100 (!) 147/86 -- -- 88 17 100 %  07/31/19 2038 (!) 149/82 -- -- 87 18 98 %  07/31/19 1925 (!) 149/88 98.3 F (36.8 C) Oral 80 17 98 %  07/31/19 1812 (!) 142/81 97.9 F (36.6 C) Oral 84 18 98 %  07/31/19 1654 (!) 110/91 -- -- 91 -- --  07/31/19 1645 (!) 141/122 -- -- 90 20 --  07/31/19 1630 138/83 -- -- 88 20 --  07/31/19 1617 (!) 149/86 -- -- 83 20 --  07/31/19 1600 140/82 -- -- 84 18 --  07/31/19 1546 137/78 -- -- 90 20 --  07/31/19 1541 135/81 -- -- 90 20 --  07/31/19 1540 130/70 -- -- 90 20 --  07/31/19 1531 101/78 -- -- 98 20 --  07/31/19 1501 119/71 -- -- (!) 115 20 --  07/31/19 1440 127/76 -- -- (!)  102 -- --  07/31/19 1430 -- -- -- 90 20 --  07/31/19 1403 129/74 -- -- 99 -- --  07/31/19 1400 138/89 -- -- 96 18 --  07/31/19 1330 (!) 144/91 -- -- 98 18 --  07/31/19 1301 139/83 98.5 F (36.9 C) Oral 95 20 --  07/31/19 1230 122/76 -- -- 97 20 --  07/31/19 1200 123/65 -- -- 94 18 --  07/31/19 1130 125/71 -- -- 91 18 --  07/31/19 1100 134/83 -- -- 90 20 --  07/31/19 1030 130/89 -- -- 93 20 --  07/31/19 1000 122/72 -- -- 90 18 --  07/31/19 0934 118/66 -- -- 88 -- --  07/31/19 0930 -- 97.8 F (  36.6 C) Oral -- 18 --   LABS:  Recent Labs    07/31/19 1720 08/01/19 0334  WBC 37.3* 33.8*  HGB 11.4* 10.4*  HCT 32.6* 29.7*  PLT 247 248   CMP Latest Ref Rng & Units 08/01/2019 07/31/2019 07/30/2019  Glucose 70 - 99 mg/dL 631(S) 970(Y) 84  BUN 6 - 20 mg/dL <6(V) <7(C) <5(Y)  Creatinine 0.44 - 1.00 mg/dL 8.50 2.77 4.12  Sodium 135 - 145 mmol/L 137 137 138  Potassium 3.5 - 5.1 mmol/L 2.8(L) 3.2(L) 2.9(L)  Chloride 98 - 111 mmol/L 105 108 110  CO2 22 - 32 mmol/L 21(L) 21(L) 21(L)  Calcium 8.9 - 10.3 mg/dL 7.4(L) 8.0(L) 8.2(L)  Total Protein 6.5 - 8.1 g/dL 5.1(L) 6.1(L) 5.5(L)  Total Bilirubin 0.3 - 1.2 mg/dL 0.7 0.9 0.6  Alkaline Phos 38 - 126 U/L 158(H) 188(H) 149(H)  AST 15 - 41 U/L 27 18 16   ALT 0 - 44 U/L 12 11 9     Blood type: --/--/B POS, B POS Performed at [facility], [address] [place], [address] [place]  [phone])  Rubella: Immune (12/07 0000)   I&O: I/O last 3 completed shifts: In: 11416.2 [P.O.:6736; I.V.:4578.2; Other:102] Out: (67/20 [Urine:9651; Emesis/NG output:200; Blood:155]          Total I/O In: -  Out: 600 [Urine:600]    Gen: AAO x 3, NAD  Abdomen: soft, non-tender, non-distended             Fundus: firm, non-tender, U-1  Perineum: repair intact  Lochia: small  Extremities: trace edema, no calf pain or tenderness    A/P: PPD # 1 29 y.o., G1P0101   Active Problems:   Preeclampsia, severe Mag Sulfate infusing, off at 1530 PB stable, no  neural sx, labs stable Continue closely monitoring, daily weights and strict I&O until DC  Doing well - stable status  Routine post partum orders  Anticipate discharge tomorrow    06-14-2005, MSN, CNM 08/01/2019, 9:15 AM

## 2019-08-01 NOTE — Anesthesia Postprocedure Evaluation (Signed)
Anesthesia Post Note  Patient: Angelica Kim  Procedure(s) Performed: AN AD HOC LABOR EPIDURAL     Patient location during evaluation: Mother Baby Anesthesia Type: Epidural Level of consciousness: awake and alert Pain management: pain level controlled Vital Signs Assessment: post-procedure vital signs reviewed and stable Respiratory status: spontaneous breathing, nonlabored ventilation and respiratory function stable Cardiovascular status: stable Postop Assessment: no headache, no backache and epidural receding Anesthetic complications: no   No complications documented.  Last Vitals:  Vitals:   08/01/19 1300 08/01/19 1656  BP:  (!) 148/88  Pulse:  76  Resp: 17 18  Temp:  36.8 C  SpO2:  99%    Last Pain:  Vitals:   08/01/19 1656  TempSrc: Oral  PainSc:    Pain Goal: Patients Stated Pain Goal: 0 (08/01/19 0009)                 Rica Records

## 2019-08-01 NOTE — Lactation Note (Signed)
This note was copied from a baby's chart. Lactation Consultation Note  Patient Name: Angelica Kim IRJJO'A Date: 08/01/2019 Reason for consult: Follow-up assessment  [phone] - 0821 - I conducted an initial visit with [name]. She was changing [name]'s diaper (wet and dirty) upon entry. She had just fed 8 mls of EBM by bottle prior to entry. She states that [name] has difficulty latching. She has been giving mostly bottles of formula and EBM, but she states that she'd like to breast feed. 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  She had a DEBP set up at the bedside. She is expressing 8-10 mls thus far. I praised her for pumping and stated that she was expressing well at 17 hours postpartum.  I reviewed the LPI guidelines due to baby's weight. I discouraged use of artificial nipples and pacifiers (pacifier at bedside) until breast feeding is well established, and I provided a foley feeding cup. I recommended that since [name] would like to learn how to breast feed that she offer the breast before giving supplement.  I also encouraged [name] to call lactation today for latching help. We attempted in football hold on the right breast, but baby was not hungry. [name] began to use her pump while in the room.  [name] states that she is receiving a Lansinoh pump via insurance, and it should arrive on Monday.  Breast feeding basics reviewed. I recommended breast feeding on demand 8-12 times a day, and wake baby to feed as needed.  Maternal Data Has patient been taught Hand Expression?: Yes Does the patient have breastfeeding experience prior to this delivery?: No  Feeding Feeding Type: Bottle Fed - Breast Milk (fed just prior to my entry) Nipple Type: Slow - flow  LATCH Score Latch: Too sleepy or reluctant, no latch achieved, no sucking elicited.  Audible Swallowing: None  Type of Nipple: Flat  Comfort  (Breast/Nipple): Soft / non-tender  Hold (Positioning): Assistance needed to correctly position infant at breast and maintain latch.  LATCH Score: 4  Interventions Interventions: Breast feeding basics reviewed;Assisted with latch;Hand express;DEBP;Support pillows  Lactation Tools Discussed/Used Pump Review: Setup, frequency, and cleaning;Milk Storage   Consult Status Consult Status: Follow-up Date: 08/01/19 Follow-up type: In-patient    Walker Shadow 08/01/2019, 8:27 AM

## 2019-08-01 NOTE — Lactation Note (Signed)
This note was copied from a baby's chart. Lactation Consultation Note  Patient Name: Angelica Kim Today's Date: 08/01/2019   Mom requesting help to breastfeed Angelica Kim. [name] Angelica Kim now 14 hours old. Mom trying to latch her on the left breast in cradle hold. LC assisted mom in breastfeeding on left breast in cross cradle hold. Once LC t cup nipple infant is able to maintain well and after a few sucks LC can switch hand to just gently holding breast.  Infant still comes  off and on some but overall breastfed well.  After she had been on left breast about 20 minutes, once she came off,  Switched her to right breast. Assisted breastfeeding on right breast in cross cradle hold as wwll.  Infant breastfed about anothwr 10 minutes and became very sleepy.  LC assisted mom in giving approximately 12 ml via cup past breastfeeding.Left mom and baby STS.  Urged mom to call lactation as needed.  Name on board Maternal Data    Feeding Feeding Type: Breast Milk  LATCH Score                   Interventions    Lactation Tools Discussed/Used     Consult Status      Jim Philemon Michaelle Copas 08/01/2019, 5:02 PM

## 2019-08-01 NOTE — Lactation Note (Signed)
This note was copied from a baby's chart. Lactation Consultation Note  Patient Name: [name] Date: 08/01/2019 Reason for consult: Follow-up assessment;Mother's request  [phone] - 0915 - LC paged to room to assist with latching baby. Baby [name] was fussing while Ms. Kitts was trying to latch in football hold on the right breast. I assisted and was abel to help baby latch briefly with a few suckles before she refused the breast and closed her eyes.  We had EBM ready to give, and I showed Ms. Phetteplace how to cup feed. We fed baby 6 mls of EBM, and then I placed her with Ms. Lukin on her chest.  I recommended to continue to try to put baby to breast first and practice latching baby. Baby everted nipple well. She did not have stamina at the breast however. A nipple shield may help baby with latch if not improving later today.  Maternal Data Has patient been taught Hand Expression?: Yes Does the patient have breastfeeding experience prior to this delivery?: No  Feeding Feeding Type: Bottle Fed - Breast Milk (fed just prior to my entry) Nipple Type: Slow - flow  LATCH Score Latch: Repeated attempts needed to sustain latch, nipple held in mouth throughout feeding, stimulation needed to elicit sucking reflex.  Audible Swallowing: None  Type of Nipple: Everted at rest and after stimulation  Comfort (Breast/Nipple): Soft / non-tender  Hold (Positioning): Assistance needed to correctly position infant at breast and maintain latch.  LATCH Score: 6  Interventions Interventions: Breast feeding basics reviewed;Assisted with latch;Expressed milk  Lactation Tools Discussed/Used Tools: Feeding cup Pump Review: Setup, frequency, and cleaning;Milk Storage   Consult Status Consult Status: Follow-up Date: 08/01/19 Follow-up type: In-patient    Walker Shadow 08/01/2019, 9:17 AM

## 2019-08-02 LAB — CULTURE, BETA STREP (GROUP B ONLY)

## 2019-08-02 MED ORDER — NIFEDIPINE ER 30 MG PO TB24: 30.0000 mg | ORAL_TABLET | Freq: Every day | ORAL | 0 refills | Status: DC

## 2019-08-02 MED ORDER — NIFEDIPINE ER OSMOTIC RELEASE 30 MG PO TB24
30.0000 mg | ORAL_TABLET | Freq: Every day | ORAL | Status: DC
  Administered 2019-08-02: 30 mg via ORAL
  Filled 2019-08-02: qty 1

## 2019-08-02 NOTE — Lactation Note (Signed)
This note was copied from a baby's chart. Lactation Consultation Note  Patient Name: Angelica Kim BHALP'F Date: 08/02/2019 Reason for consult: Follow-up assessment;1st time breastfeeding;Late-preterm 34-36.6wks;Infant < 6lbs Baby 41hrs old, 5% wt loss. Upon entering room baby resting in bassinet, dad asleep on couch, mom resting in bed, states baby due for feeding. Baby up to breast skin to skin, baby with repeated attempts to latch to breast 1-22mins, LC demonstrated compressing breast/ sandwiching at [place] and hold breast in baby's mouth duration of feeding. Baby latched, multiple audible swallows noted, mom reports good tugs at breast. Reinforced limit feeding to total, breast and bottle, pump after each feeding, offer volume back to baby. Mom pumping per feeding at this time. Mom reports Lansinoh DEBP will arrive tomorrow, declined offer for [facility] loaner pump upon discharge, will use hand pump until DEBP arrives. Mom states with pediatric appt 2 days after discharge, office has a Advertising copywriter. Advised mom to call for [facility] appt sooner if baby with difficulty waking for feeds, decrease in wet/ stool diapers, yellowing to skin or difficulty latching. Mom reports plans to return to work 6-8weeks, works 24hr shifts, [facility] brochure given, advised to call for [facility] advice/ outpatient appt as needed for milk supply maintenance or other questions. Mom voiced understanding and with no further concerns. Upon leaving room baby still latched at mark. BGilliam, RN, IBCLC  Maternal Data    Feeding Feeding Type: Breast Fed (cross cradle right ) Nipple Type: Slow - flow  LATCH Score Latch: Repeated attempts needed to sustain latch, nipple held in mouth throughout feeding, stimulation needed to elicit sucking reflex.  Audible Swallowing: Spontaneous and intermittent  Type of Nipple: Flat  Comfort (Breast/Nipple): Soft / non-tender  Hold (Positioning): Assistance needed to  correctly position infant at breast and maintain latch.  LATCH Score: 7  Interventions Interventions: Assisted with latch;Skin to skin;Breast compression;Adjust position  Lactation Tools Discussed/Used     Consult Status Consult Status: Complete Date: 08/02/19    Charlynn Court 08/02/2019, 8:45 AM

## 2019-08-02 NOTE — Discharge Summary (Signed)
SVD OB Discharge Summary     Patient Name: Angelica Kim DOB: [birth year] MRN: [id number]  Date of admission: 07/30/2019 Delivering MD: Angelica Kim  Date of delivery: 07/31/2019 Type of delivery: SVD  Newborn Data: Sex: Baby female Live born female  Birth Weight: 5 lb 12.9 oz (2634 g) APGAR: 8, 9  Newborn Delivery   Birth date/time: 07/31/2019 15:24:00 Delivery type: Vaginal, Spontaneous      Feeding: breast and bottle Infant being discharge to home with mother in stable condition.   Admitting diagnosis: Preeclampsia, severe, third trimester [O14.13] Intrauterine pregnancy: [redacted]w[redacted]d     Secondary diagnosis:  Principal Problem:   Postpartum care following vaginal delivery 7/2 Active Problems:   Preeclampsia, severe   SVD (spontaneous vaginal delivery)   Perineal laceration, second degree                                Complications: None                                                              Intrapartum Procedures: IOL preterm at 36.5 for PreE with SF, spontaneous vaginal delivery and GBS prophylaxis Postpartum Procedures: 24Hour postpartum magnesium Complications-Operative and Postpartum: 2nd degree perineal laceration Augmentation: AROM, Pitocin and IP Foley   History of Present Illness: Ms. Angelica Kim is a 29 y.o. female, G1P0101, who presents at [redacted]w[redacted]d weeks gestation. The patient has been followed at  [facility] and Gynecology  Her pregnancy has been complicated by:  Patient Active Problem List   Diagnosis Date Noted  . SVD (spontaneous vaginal delivery) 08/01/2019  . Postpartum care following vaginal delivery 7/2 08/01/2019  . Perineal laceration, second degree 08/01/2019  . Preeclampsia, severe 07/30/2019  . Generalized anxiety disorder 04/07/2014  . Migraines 12/21/2013  . Low back pain 12/21/2013  . Cognitive changes 12/21/2013    Hospital course:  Induction of Labor With Vaginal Delivery   30 y.o. yo G1P0101 at [redacted]w[redacted]d was admitted to  the hospital 07/30/2019 for induction of labor.  Indication for induction: Preeclampsia and Preterm at 36.5.  Patient had an uncomplicated labor course as follows: Membrane Rupture Time/Date: 4:59 AM ,07/31/2019   Delivery Method:Vaginal, Spontaneous  Episiotomy: None  Lacerations:  2nd degree  Details of delivery can be found in separate delivery note.  Patient had a routine postpartum course. Patient is discharged home 08/02/19.  Newborn Data: Birth date:07/31/2019  Birth time:3:24 PM  Gender:Female  Living status:Living  Apgars:8 ,9  LHTDSK:8768 g    7/2 @ 0930: Angelica Kim a 29 y.o.femaleG1P0 [redacted]w[redacted]d on 7/1 presenting for HA not relieved w/ Tylenol. Treated w/ Fioricet in MAU for HA 6/10 and reduced to 3/10, reports a gradual increase in stabbing pain 1 hour after Fioricet.Endorses active FM, denies LOF and vaginal bleeding.Denies visual changes and epigastric pain.  Dx w/ pre eclampsia 6/20, PCR was 0.33, other labs unremarkable, received 4gm loading bolus of magnesium and continues with 2gm/hr.  7/2 @ 1615: Angelica Kim, 29 y.o., @ [redacted]w[redacted]d,  G1P0101, who was admitted for IOL with Preeclampsia with severe feature, mid range BP upon admission, had HA and PCR of 0.31 and was placed on magnesium, pt progressed through labor wit IP foley bulb, then AROM,  then Pitocin, got to 26 of pitocin, and was noted to be completed, remained stable without HA, Vision changes or RUQ pain. Pt was given BMZ for Preterm induction @ 36.5 weeks on 7/1-7/2, had NSVD.   7/3: Continued on magnesium for 24 hours PP, d/c'ed on 7/3 @ 1600, had elevated BP without s/sx. Lab repeat were unremarkable. No meds at this time.   7/4: Postpartum Day # 2 : S/P NSVD due to IOL for PreE. Patient up ad lib, denies syncope or dizziness. Reports consuming regular diet without issues and denies N/V. Patient reports 0 bowel movement + passing flatus.  Denies issues with urination and reports bleeding is "lighter."  Patient is breast and  bottle feeding and reports going well.  Desires undecided for postpartum contraception.  Pain is being appropriately managed with use of po meds.  Started on procardia at 30mg  xl daily due to elevated Bps over night 150/80s, now 141/2, denies HA, RUq pain or vision changes, plans to f/u in one week for BP check. Will continue procardia at home, verbalized understanding.   Physical exam  Vitals:   08/02/19 0543 08/02/19 0546 08/02/19 0800 08/02/19 1116  BP:  135/89 (!) 141/72 (!) 142/84  Pulse:  62 62 69  Resp:  19 18   Temp:  98.5 F (36.9 C) 98.1 F (36.7 C)   TempSrc:  Oral Oral   SpO2:  100% 100%   Weight: 77.2 kg     Height:       General: alert, cooperative and no distress Lochia: appropriate Uterine Fundus: firm Perineum: approximate, no hematomas noted DVT Evaluation: No evidence of DVT seen on physical exam. Negative Homan's sign. No cords or calf tenderness. No significant calf/ankle edema.  Labs: Lab Results  Component Value Date   WBC 33.8 (H) 08/01/2019   HGB 10.4 (L) 08/01/2019   HCT 29.7 (L) 08/01/2019   MCV 87.6 08/01/2019   PLT 248 08/01/2019   CMP Latest Ref Rng & Units 08/01/2019  Glucose 70 - 99 mg/dL 10/02/2019)  BUN 6 - 20 mg/dL 154(M)  Creatinine <0(Q - 1.00 mg/dL 6.76  Sodium 1.95 - 093 mmol/L 137  Potassium 3.5 - 5.1 mmol/L 2.8(L)  Chloride 98 - 111 mmol/L 105  CO2 22 - 32 mmol/L 21(L)  Calcium 8.9 - 10.3 mg/dL 7.4(L)  Total Protein 6.5 - 8.1 g/dL 5.1(L)  Total Bilirubin 0.3 - 1.2 mg/dL 0.7  Alkaline Phos 38 - 126 U/L 158(H)  AST 15 - 41 U/L 27  ALT 0 - 44 U/L 12    Date of discharge: 08/02/2019 Discharge Diagnoses: Preelampsia and preter, delivery  Discharge instruction: per After Visit Summary and "Baby and Me Booklet".  After visit meds:   Activity:           unrestricted and pelvic rest Advance as tolerated. Pelvic rest for 6 weeks.  Diet:                routine Medications: PNV and tylenol  Postpartum contraception: Undecided Condition:   Pt discharge to home with baby in stable PreE with SF: Monitor BP, one week f/u BP check, report s/sx of preE, continue procardia.   Meds: Allergies as of 08/02/2019      Reactions   Amoxicillin Hives, Shortness Of Breath, Swelling   Swelling of hands, face, and lip Has patient had a PCN reaction causing immediate rash, facial/tongue/throat swelling, SOB or lightheadedness with hypotension: Unknown Has patient had a PCN reaction causing severe rash involving mucus  membranes or skin necrosis: Yes Has patient had a PCN reaction that required hospitalization: No Has patient had a PCN reaction occurring within the last 10 years: No If all of the above answers are "NO", then may proceed with Cephalosporin use.   Zofran [ondansetron Hcl] Nausea Only      Medication List    TAKE these medications   acetaminophen 500 MG tablet Commonly known as: TYLENOL Take 500 mg by mouth every 6 (six) hours as needed for moderate pain.   albuterol 108 (90 Base) MCG/ACT inhaler Commonly known as: VENTOLIN HFA Inhale 2 puffs into the lungs every 6 (six) hours as needed for wheezing or shortness of breath.   fluticasone 50 MCG/BLIST diskus inhaler Commonly known as: FLOVENT DISKUS Inhale 2 puffs into the lungs 2 (two) times daily.   mometasone 50 MCG/ACT nasal spray Commonly known as: NASONEX Place 2 sprays into the nose 2 (two) times daily.   NIFEdipine 30 MG 24 hr tablet Commonly known as: ADALAT CC Take 1 tablet (30 mg total) by mouth daily.   prenatal multivitamin Tabs tablet Take 1 tablet by mouth daily at 12 noon.   terconazole 0.4 % vaginal cream Commonly known as: TERAZOL 7 Place 1 applicator vaginally at bedtime. Use for seven days       Discharge Follow Up:   Follow-up Information    [facility] Follow up.   Specialty: Obstetrics and Gynecology Why: 1 week BP check and 6 week PPV Contact information: [address]  [place] [address] [phone]               Hardwick, NP-C, CNM 08/02/2019, 11:38 AM  Angelica Seabrook, FNP

## 2019-08-02 NOTE — Progress Notes (Signed)
Pt discharged after discharge instructions given. All questions answered. IV discontinued. Pt in stable condition and discharged with all belongings and her baby.

## 2019-08-03 ENCOUNTER — Inpatient Hospital Stay (HOSPITAL_COMMUNITY)
Admission: AD | Admit: 2019-08-03 | Payer: Managed Care, Other (non HMO) | Source: Home / Self Care | Admitting: Obstetrics & Gynecology

## 2019-08-03 ENCOUNTER — Inpatient Hospital Stay (HOSPITAL_COMMUNITY): Payer: Managed Care, Other (non HMO)

## 2019-08-04 LAB — SURGICAL PATHOLOGY

## 2019-10-13 ENCOUNTER — Other Ambulatory Visit: Payer: Self-pay

## 2019-10-13 ENCOUNTER — Ambulatory Visit
Admission: EM | Admit: 2019-10-13 | Discharge: 2019-10-13 | Disposition: A | Discharge status: Home / Self Care | Payer: Managed Care, Other (non HMO) | Attending: Family Medicine | Admitting: Family Medicine

## 2019-10-13 DIAGNOSIS — R519 Headache, unspecified: Secondary | ICD-10-CM

## 2019-10-13 MED ORDER — KETOROLAC TROMETHAMINE 30 MG/ML IJ SOLN
30.0000 mg | Freq: Once | INTRAMUSCULAR | Status: AC
  Administered 2019-10-13: 30 mg via INTRAMUSCULAR

## 2019-10-13 MED ORDER — HYDROXYZINE HCL 25 MG PO TABS: 25.0000 mg | ORAL_TABLET | Freq: Four times a day (QID) | ORAL | 0 refills | Status: DC

## 2019-10-13 MED ORDER — TRIAMTERENE-HCTZ 37.5-25 MG PO TABS
1.0000 | ORAL_TABLET | Freq: Every day | ORAL | 0 refills | Status: DC
Start: 2019-10-13 — End: 2020-01-03

## 2019-10-13 NOTE — Discharge Instructions (Addendum)
Continue to monitor blood pressur may use hydrochlorothiazide if pressures consistently above 130/80

## 2019-10-13 NOTE — ED Provider Notes (Signed)
[name]    CSN: [id number] Arrival date & time: 10/13/19  1238      History   Chief Complaint Chief Complaint  Patient presents with  . Headache    HPI Angelica Kim is a 29 y.o. female.   Patient complains of headache and hypertension.  She is 11 weeks postpartum.  She had was treated for blood pressure after her pregnancy.  During pregnancy she has some headaches and edema and may have been preeclamptic but apparently that was not diagnosed.  She has history of migraine.  She took a triptan but that did not affect headache.  There are no visual symptoms no nausea or vomiting.  I asked her blood pressure is been is 150/102  HPI  Past Medical History:  Diagnosis Date  . Ankylosing spondylitis (HCC)   . Asthma   . Fibromyalgia   . Kidney stones   . Migraine   . Ovarian cyst   . Pregnancy induced hypertension     Patient Active Problem List   Diagnosis Date Noted  . SVD (spontaneous vaginal delivery) 08/01/2019  . Postpartum care following vaginal delivery 7/2 08/01/2019  . Perineal laceration, second degree 08/01/2019  . Preeclampsia, severe 07/30/2019  . Generalized anxiety disorder 04/07/2014  . Migraines 12/21/2013  . Low back pain 12/21/2013  . Cognitive changes 12/21/2013    Past Surgical History:  Procedure Laterality Date  . COLONOSCOPY    . ESOPHAGOGASTRODUODENOSCOPY ENDOSCOPY    . spinal tap    . WISDOM TOOTH EXTRACTION    . WISDOM TOOTH EXTRACTION  2010    OB History    Gravida  1   Para  1   Term  0   Preterm  1   AB  0   Living  1     SAB  0   TAB  0   Ectopic  0   Multiple  0   Live Births  1            Home Medications    Prior to Admission medications   Medication Sig Start Date End Date Taking? Authorizing Provider  acetaminophen (TYLENOL) 500 MG tablet Take 500 mg by mouth every 6 (six) hours as needed for moderate pain.    [provider]  albuterol (PROVENTIL HFA;VENTOLIN HFA) 108 (90  BASE) MCG/ACT inhaler Inhale 2 puffs into the lungs every 6 (six) hours as needed for wheezing or shortness of breath.    [provider]  mometasone (NASONEX) 50 MCG/ACT nasal spray Place 2 sprays into the nose 2 (two) times daily.     [provider]  Prenatal Vit-Fe Fumarate-FA (PRENATAL MULTIVITAMIN) TABS tablet Take 1 tablet by mouth daily at 12 noon.    [provider]  metoCLOPramide (REGLAN) 10 MG tablet Take 1 tablet (10 mg total) by mouth every 6 (six) hours. 03/22/17 07/31/18  [name], Coral Else, PA-C    Family History Family History  Problem Relation Age of Onset  . Arthritis Mother   . Hypertension Maternal Grandmother   . Thyroid disease Maternal Grandmother   . Kidney Stones Maternal Grandfather     Social History Social History   Tobacco Use  . Smoking status: Former Smoker    Quit date: 2014    Years since quitting: 7.7  . Smokeless tobacco: Never Used  Vaping Use  . Vaping Use: Never used  Substance Use Topics  . Alcohol use: Yes    Alcohol/week: 0.0 standard drinks  Comment: occassionally wine  every other weekend  . Drug use: No     Allergies   Amoxicillin and Zofran [ondansetron hcl]   Review of Systems Review of Systems  Respiratory: Negative for shortness of breath.   Cardiovascular: Negative for chest pain and leg swelling.  Neurological: Positive for headaches.  All other systems reviewed and are negative.    Physical Exam Triage Vital Signs ED Triage Vitals [10/13/19 1339]  Enc Vitals Group     BP 124/77     Pulse Rate (!) 118     Resp 18     Temp 99.7 F (37.6 C)     Temp Source Oral     SpO2 97 %     Weight      Height      Head Circumference      Peak Flow      Pain Score 8     Pain Loc      Pain Edu?      Excl. in GC?    No data found.  Updated Vital Signs BP 124/77 (BP Location: Left Arm)   Pulse (!) 118   Temp 99.7 F (37.6 C) (Oral)   Resp 18   LMP 11/16/2018   SpO2 97%   Breastfeeding  Yes   Visual Acuity Right Eye Distance:   Left Eye Distance:   Bilateral Distance:    Right Eye Near:   Left Eye Near:    Bilateral Near:     Physical Exam Vitals and nursing note reviewed.  Constitutional:      Appearance: She is well-developed.  HENT:     Head: Normocephalic.  Cardiovascular:     Rate and Rhythm: Regular rhythm. Tachycardia present.     Heart sounds: Normal heart sounds.  Pulmonary:     Effort: Pulmonary effort is normal.     Breath sounds: Normal breath sounds.  Musculoskeletal:        General: Normal range of motion.  Neurological:     Mental Status: She is alert and oriented to person, place, and time.  Psychiatric:        Mood and Affect: Mood is anxious.        Behavior: Behavior is agitated.      UC Treatments / Results  Labs (all labs ordered are listed, but only abnormal results are displayed) Labs Reviewed - No data to display  EKG   Radiology No results found.  Procedures Procedures (including critical care time)  Medications Ordered in UC Medications - No data to display  Initial Impression / Assessment and Plan / UC Course  I have reviewed the triage vital signs and the nursing notes.  Pertinent labs & imaging results that were available during my care of the patient were reviewed by me and considered in my medical decision making (see chart for details).     Headache.  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  Generally speaking 150/102 is not high enough to become symptomatic.  I have encouraged her to continue monitoring her blood pressure.  I think her headache may be related to muscle contraction and stress although I am not sure she accepts this but she is willing to take Toradol for headache relief Final Clinical Impressions(s) / UC Diagnoses   Final diagnoses:  None   Discharge Instructions   None    ED Prescriptions    None     PDMP not  reviewed this encounter.     Frederica Kuster, MD 10/13/19 1359

## 2019-10-13 NOTE — ED Triage Notes (Signed)
Pt states she is 11wks post partum. States had pre/post eclampsia but has been of b/p meds 5wks. States has had a severe headache since yesterday at work and b/p was 150/102.

## 2019-10-19 ENCOUNTER — Other Ambulatory Visit: Payer: Self-pay

## 2019-10-19 ENCOUNTER — Encounter: Payer: Self-pay | Admitting: Emergency Medicine

## 2019-10-19 ENCOUNTER — Ambulatory Visit
Admission: EM | Admit: 2019-10-19 | Discharge: 2019-10-19 | Disposition: A | Discharge status: Home / Self Care | Payer: Managed Care, Other (non HMO) | Attending: Urgent Care | Admitting: Urgent Care

## 2019-10-19 DIAGNOSIS — R0981 Nasal congestion: Secondary | ICD-10-CM

## 2019-10-19 DIAGNOSIS — J069 Acute upper respiratory infection, unspecified: Secondary | ICD-10-CM | POA: Diagnosis not present

## 2019-10-19 DIAGNOSIS — Z1152 Encounter for screening for COVID-19: Secondary | ICD-10-CM

## 2019-10-19 MED ORDER — PROMETHAZINE-DM 6.25-15 MG/5ML PO SYRP: 5.0000 mL | ORAL_SOLUTION | Freq: Every evening | ORAL | 0 refills | Status: DC | PRN

## 2019-10-19 MED ORDER — BENZONATATE 100 MG PO CAPS: 100.0000 mg | ORAL_CAPSULE | Freq: Three times a day (TID) | ORAL | 0 refills | Status: DC | PRN

## 2019-10-19 NOTE — ED Provider Notes (Signed)
[facility]   MRN: [id number] DOB: Feb 05, 1990  Subjective:   Angelica Kim is a 29 y.o. female presenting for 3-day history of acute onset nasal congestion, cough.  Cough elicits chest pain.  States headache.  Has not had COVID-19.  Has not had COVID vaccination.  Patient has moderate persistent asthma, uses her inhaler consistently but does not need to use her rescue inhaler at the moment.  Denies fever, loss of sense of taste and smell shortness of breath, wheezing.  Patient has been using formula to feed her baby.  She is not planning on breast-feeding until she improves.  No current facility-administered medications for this encounter.  Current Outpatient Medications:  .  acetaminophen (TYLENOL) 500 MG tablet, Take 500 mg by mouth every 6 (six) hours as needed for moderate pain., Disp: , Rfl:  .  albuterol (PROVENTIL HFA;VENTOLIN HFA) 108 (90 BASE) MCG/ACT inhaler, Inhale 2 puffs into the lungs every 6 (six) hours as needed for wheezing or shortness of breath., Disp: , Rfl:  .  hydrOXYzine (ATARAX/VISTARIL) 25 MG tablet, Take 1 tablet (25 mg total) by mouth every 6 (six) hours., Disp: 12 tablet, Rfl: 0 .  mometasone (NASONEX) 50 MCG/ACT nasal spray, Place 2 sprays into the nose 2 (two) times daily. , Disp: , Rfl:  .  Prenatal Vit-Fe Fumarate-FA (PRENATAL MULTIVITAMIN) TABS tablet, Take 1 tablet by mouth daily at 12 noon., Disp: , Rfl:  .  triamterene-hydrochlorothiazide (MAXZIDE-25) 37.5-25 MG tablet, Take 1 tablet by mouth daily., Disp: 30 tablet, Rfl: 0   Allergies  Allergen Reactions  . Amoxicillin Hives, Shortness Of Breath and Swelling    Swelling of hands, face, and lip Has patient had a PCN reaction causing immediate rash, facial/tongue/throat swelling, SOB or lightheadedness with hypotension: Unknown Has patient had a PCN reaction causing severe rash involving mucus membranes or skin necrosis: Yes Has patient had a PCN reaction that required hospitalization: No Has  patient had a PCN reaction occurring within the last 10 years: No If all of the above answers are "NO", then may proceed with Cephalosporin use.   Marland Kitchen Zofran [Ondansetron Hcl] Nausea Only    Past Medical History:  Diagnosis Date  . Ankylosing spondylitis (HCC)   . Asthma   . Fibromyalgia   . Kidney stones   . Migraine   . Ovarian cyst   . Pregnancy induced hypertension      Past Surgical History:  Procedure Laterality Date  . COLONOSCOPY    . ESOPHAGOGASTRODUODENOSCOPY ENDOSCOPY    . spinal tap    . WISDOM TOOTH EXTRACTION    . WISDOM TOOTH EXTRACTION  2010    Family History  Problem Relation Age of Onset  . Arthritis Mother   . Hypertension Maternal Grandmother   . Thyroid disease Maternal Grandmother   . Kidney Stones Maternal Grandfather     Social History   Tobacco Use  . Smoking status: Former Smoker    Quit date: 2014    Years since quitting: 7.7  . Smokeless tobacco: Never Used  Vaping Use  . Vaping Use: Never used  Substance Use Topics  . Alcohol use: Yes    Alcohol/week: 0.0 standard drinks    Comment: occassionally wine  every other weekend  . Drug use: No    ROS   Objective:   Vitals: BP 117/82 (BP Location: Left Arm)   Pulse 98   Temp 98.2 F (36.8 C) (Oral)   Resp 18   LMP 11/16/2018   SpO2  97%   Physical Exam Constitutional:      General: She is not in acute distress.    Appearance: Normal appearance. She is well-developed. She is not ill-appearing, toxic-appearing or diaphoretic.  HENT:     Head: Normocephalic and atraumatic.     Nose: Nose normal.     Mouth/Throat:     Mouth: Mucous membranes are moist.  Eyes:     Extraocular Movements: Extraocular movements intact.     Pupils: Pupils are equal, round, and reactive to light.  Cardiovascular:     Rate and Rhythm: Normal rate and regular rhythm.     Pulses: Normal pulses.     Heart sounds: Normal heart sounds. No murmur heard.  No friction rub. No gallop.   Pulmonary:      Effort: Pulmonary effort is normal. No respiratory distress.     Breath sounds: Normal breath sounds. No stridor. No wheezing, rhonchi or rales.  Skin:    General: Skin is warm and dry.     Findings: No rash.  Neurological:     Mental Status: She is alert and oriented to person, place, and time.  Psychiatric:        Mood and Affect: Mood normal.        Behavior: Behavior normal.        Thought Content: Thought content normal.        Judgment: Judgment normal.     Assessment and Plan :   PDMP not reviewed this encounter.  1. Viral URI with cough   2. Encounter for screening for COVID-19   3. Nasal congestion     Will manage for viral illness such as viral URI, viral syndrome, viral rhinitis, COVID-19. Counseled patient on nature of COVID-19 including modes of transmission, diagnostic testing, management and supportive care.  Offered scripts for symptomatic relief. COVID 19 testing is pending. Counseled patient on potential for adverse effects with medications prescribed/recommended today, ER and return-to-clinic precautions discussed, patient verbalized understanding.     Wallis Bamberg, [place] 10/19/19 [phone]

## 2019-10-19 NOTE — ED Triage Notes (Signed)
Pt here for cough and nasal congestion x 3 days 

## 2019-10-19 NOTE — Discharge Instructions (Signed)
We will notify you of your COVID-19 test results as they arrive and may take between 48-72 hours.  I encourage you to sign up for MyChart if you have not already done so as this can be the easiest way for us to communicate results to you online or through a phone app.  In the meantime, if you develop worsening symptoms including fever, chest pain, shortness of breath despite our current treatment plan then please report to the emergency room as this may be a sign of worsening status from possible COVID-19 infection.  Otherwise, we will manage this as a viral syndrome. For sore throat or cough try using a honey-based tea. Use 3 teaspoons of honey with juice squeezed from half lemon. Place shaved pieces of ginger into 1/2-1 cup of water and warm over stove top. Then mix the ingredients and repeat every 4 hours as needed. Please take Tylenol 500mg-650mg every 6 hours for aches and pains, fevers. Hydrate very well with at least 2 liters of water. Eat light meals such as soups to replenish electrolytes and soft fruits, veggies. Start an antihistamine like Zyrtec, Allegra or Claritin for postnasal drainage, sinus congestion.  You can take this together with pseudoephedrine (Sudafed) at a dose of 60 mg 2-3 times a day as needed for the same kind of congestion.    

## 2019-10-21 LAB — NOVEL CORONAVIRUS, NAA: SARS-CoV-2, NAA: DETECTED — AB

## 2019-10-21 LAB — SARS-COV-2, NAA 2 DAY TAT

## 2019-11-04 ENCOUNTER — Other Ambulatory Visit: Payer: Self-pay | Admitting: Family Medicine

## 2019-11-24 ENCOUNTER — Ambulatory Visit: Payer: Managed Care, Other (non HMO) | Admitting: Physician Assistant

## 2019-11-24 ENCOUNTER — Encounter: Payer: Self-pay | Admitting: Physician Assistant

## 2019-11-24 ENCOUNTER — Other Ambulatory Visit: Payer: Self-pay

## 2019-11-24 VITALS — BP 110/68 | HR 80 | Temp 98.0°F | Resp 17 | Ht 60.0 in | Wt 150.0 lb

## 2019-11-24 DIAGNOSIS — Z008 Encounter for other general examination: Secondary | ICD-10-CM | POA: Diagnosis not present

## 2019-11-24 DIAGNOSIS — Z0000 Encounter for general adult medical examination without abnormal findings: Secondary | ICD-10-CM

## 2019-11-24 NOTE — Progress Notes (Signed)
° °  Subjective:    Patient ID: Angelica Kim, female    DOB: 1990-02-15, 29 y.o.   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 Has 73 1/2 month old infant Angelica "Bella" Born at 37 weeks after Angelica Kim developed pre-eclampsia at 35 weeks, induction.. Mother and baby did well.  Angelica Kim developed Covid 19 on Sept 20 and [name] Kim got that as well. Both have since recovered Angelica Kim is first child for Angelica Kim and her boyfriend; however he has 4 children from previous relationship.  Review of Systems Covid series for job anticipated  Uses Nasonex daily ; albuterol  As needed- rarely uses  Continues to take prenatal vitamins daily Hx anxiety  Hx migraine 2015 Idiopathic acute pancreatitis  Objective:   Physical Exam Vitals and nursing note reviewed.  Constitutional:      Appearance: Normal appearance.     Comments: 150 lbs    5 '6"  VS  BP  110/68  HENT:     Head: Normocephalic and atraumatic.     Right Ear: Tympanic membrane, ear canal and external ear normal.     Left Ear: Tympanic membrane, ear canal and external ear normal.     Nose: Nose normal.     Mouth/Throat:     Mouth: Mucous membranes are moist.     Comments: Needs DDS q 6 months care, tartar Eyes:     Extraocular Movements: Extraocular movements intact.     Conjunctiva/sclera: Conjunctivae normal.  Cardiovascular:     Rate and Rhythm: Normal rate and regular rhythm.     Pulses: Normal pulses.     Heart sounds: Normal heart sounds.  Pulmonary:     Effort: Pulmonary effort is normal.     Breath sounds: Normal breath sounds.  Abdominal:     General: Abdomen is flat. Bowel sounds are normal.     Palpations: Abdomen is soft.     Comments: Previous belly ring scars, no longer using Umbilicus needs increased skin care and gentle baby oil debridement with Q tip  Genitourinary:    Comments: Defer to Gyn Musculoskeletal:        General: Normal range of motion.     Cervical back: Normal range of  motion and neck supple.  Skin:    General: Skin is warm and dry.     Capillary Refill: Capillary refill takes less than 2 seconds.     Comments: Extensive freckling   Neurological:     General: No focal deficit present.     Mental Status: She is alert.     Cranial Nerves: No cranial nerve deficit.     Deep Tendon Reflexes: Reflexes normal.  Psychiatric:        Mood and Affect: Mood normal.      Gyn- Angelica Kim at [facility] PCP- Angelica Kim in [place]  Assessment & Plan:  Encourage 30 minutes of brisk walking daily Baby may go along on pleasant days with adequate wraps My Chart is reported active Labs will be reported as available

## 2019-11-25 LAB — LIPID PANEL
Chol/HDL Ratio: 4.4 ratio (ref 0.0–4.4)
Cholesterol, Total: 155 mg/dL (ref 100–199)
HDL: 35 mg/dL — ABNORMAL LOW (ref >39)
LDL Chol Calc (NIH): 104 mg/dL — ABNORMAL HIGH (ref 0–99)
Triglycerides: 84 mg/dL (ref 0–149)
VLDL Cholesterol Cal: 16 mg/dL (ref 5–40)

## 2019-11-25 LAB — GLUCOSE, RANDOM: Glucose: 80 mg/dL (ref 65–99)

## 2020-01-02 ENCOUNTER — Other Ambulatory Visit: Payer: Self-pay

## 2020-01-02 ENCOUNTER — Inpatient Hospital Stay (HOSPITAL_COMMUNITY): Payer: Managed Care, Other (non HMO)

## 2020-01-02 ENCOUNTER — Encounter (HOSPITAL_COMMUNITY): Payer: Self-pay | Admitting: Obstetrics & Gynecology

## 2020-01-02 ENCOUNTER — Inpatient Hospital Stay (HOSPITAL_COMMUNITY)
Admission: AD | Admit: 2020-01-02 | Discharge: 2020-01-03 | Disposition: A | Discharge status: Home / Self Care | Payer: Managed Care, Other (non HMO) | Attending: Obstetrics & Gynecology | Admitting: Obstetrics & Gynecology

## 2020-01-02 DIAGNOSIS — Z7951 Long term (current) use of inhaled steroids: Secondary | ICD-10-CM | POA: Insufficient documentation

## 2020-01-02 DIAGNOSIS — Z888 Allergy status to other drugs, medicaments and biological substances status: Secondary | ICD-10-CM | POA: Insufficient documentation

## 2020-01-02 DIAGNOSIS — Z3A01 Less than 8 weeks gestation of pregnancy: Secondary | ICD-10-CM | POA: Insufficient documentation

## 2020-01-02 DIAGNOSIS — O26891 Other specified pregnancy related conditions, first trimester: Secondary | ICD-10-CM | POA: Insufficient documentation

## 2020-01-02 DIAGNOSIS — G44209 Tension-type headache, unspecified, not intractable: Secondary | ICD-10-CM | POA: Diagnosis not present

## 2020-01-02 DIAGNOSIS — Z87891 Personal history of nicotine dependence: Secondary | ICD-10-CM | POA: Insufficient documentation

## 2020-01-02 DIAGNOSIS — R1030 Lower abdominal pain, unspecified: Secondary | ICD-10-CM

## 2020-01-02 DIAGNOSIS — Z880 Allergy status to penicillin: Secondary | ICD-10-CM | POA: Insufficient documentation

## 2020-01-02 DIAGNOSIS — O99351 Diseases of the nervous system complicating pregnancy, first trimester: Secondary | ICD-10-CM | POA: Diagnosis not present

## 2020-01-02 DIAGNOSIS — R519 Headache, unspecified: Secondary | ICD-10-CM | POA: Diagnosis present

## 2020-01-02 DIAGNOSIS — R109 Unspecified abdominal pain: Secondary | ICD-10-CM | POA: Insufficient documentation

## 2020-01-02 DIAGNOSIS — O99891 Other specified diseases and conditions complicating pregnancy: Secondary | ICD-10-CM | POA: Diagnosis not present

## 2020-01-02 DIAGNOSIS — O26899 Other specified pregnancy related conditions, unspecified trimester: Secondary | ICD-10-CM

## 2020-01-02 LAB — WET PREP, GENITAL
Clue Cells Wet Prep HPF POC: NONE SEEN
Trich, Wet Prep: NONE SEEN
Yeast Wet Prep HPF POC: NONE SEEN

## 2020-01-02 LAB — URINALYSIS, ROUTINE W REFLEX MICROSCOPIC
Bilirubin Urine: NEGATIVE
Glucose, UA: NEGATIVE mg/dL
Hgb urine dipstick: NEGATIVE
Ketones, ur: NEGATIVE mg/dL
Leukocytes,Ua: NEGATIVE
Nitrite: NEGATIVE
Protein, ur: NEGATIVE mg/dL
Specific Gravity, Urine: 1.023 (ref 1.005–1.030)
pH: 5 (ref 5.0–8.0)

## 2020-01-02 LAB — POCT PREGNANCY, URINE: Preg Test, Ur: POSITIVE — AB

## 2020-01-02 MED ORDER — BUTALBITAL-APAP-CAFFEINE 50-325-40 MG PO TABS
2.0000 | ORAL_TABLET | Freq: Once | ORAL | Status: AC
  Administered 2020-01-02: 2 via ORAL
  Filled 2020-01-02: qty 2

## 2020-01-02 NOTE — MAU Note (Addendum)
..  Angelica Kim is a 29 y.o. at [redacted]w[redacted]d here in MAU reporting: sharp bilateral lower abdominal pain that began on Wednesday and has worsened. Denies vaginal bleeding or abnormal vaginal discharge. As well as a headache that began last night.   LMP: 11/18/2019  Pain score: 6/10 abdominal pain. 7/10 headache Vitals:   01/02/20 2138  BP: 132/81  Pulse: 88  Resp: 16  Temp: 99 F (37.2 C)  SpO2: 100%    Lab orders placed from triage: UPT and UA

## 2020-01-02 NOTE — MAU Provider Note (Signed)
History     CSN: [id number]  Arrival date and time: 01/02/20 2054   First Provider Initiated Contact with Patient 01/02/20 2207      Chief Complaint  Patient presents with  . Abdominal Pain  . Headache   Angelica Kim is a 29 y.o. G2P0101 at [redacted]w[redacted]d who receives care at [facility].  She presents today for Abdominal Pain and Headache.  She states her abdominal pain has been present since prior to pregnancy as she has been experiencing "cramping the whole time."  Patient states that on Wednesday the pain changed to a constant throbbing with intermittent sharpness that radiated to her back.  Patient rates the pain a 6/10 and states she has not identified any relieving or aggravating factors.  Patient states her HA started this morning around 0200 and is located in the front and back of her head.  Patient describes the pain as a "tightness" and reports taking tylenol and imitrex without relief of symptoms.  Patient denies any aggravating or relieving factors regarding her HA.  She reports that has had ~ 6 bottles of water today and has eaten a "Chewy Bar, BBQ, and Pizza" throughout the day.     OB History    Gravida  2   Para  1   Term  0   Preterm  1   AB  0   Living  1     SAB  0   TAB  0   Ectopic  0   Multiple  0   Live Births  1           Past Medical History:  Diagnosis Date  . Ankylosing spondylitis (HCC)   . Asthma   . Fibromyalgia   . Kidney stones   . Migraine   . Ovarian cyst   . Pregnancy induced hypertension     Past Surgical History:  Procedure Laterality Date  . COLONOSCOPY    . ESOPHAGOGASTRODUODENOSCOPY ENDOSCOPY    . spinal tap    . WISDOM TOOTH EXTRACTION    . WISDOM TOOTH EXTRACTION  2010    Family History  Problem Relation Age of Onset  . Arthritis Mother   . Hypertension Maternal Grandmother   . Thyroid disease Maternal Grandmother   . Kidney Stones Maternal Grandfather     Social History   Tobacco Use  . Smoking status: Former  Smoker    Quit date: 2014    Years since quitting: 7.9  . Smokeless tobacco: Never Used  Vaping Use  . Vaping Use: Never used  Substance Use Topics  . Alcohol use: Not Currently    Alcohol/week: 0.0 standard drinks  . Drug use: No    Allergies:  Allergies  Allergen Reactions  . Amoxicillin Hives, Shortness Of Breath and Swelling    Swelling of hands, face, and lip Has patient had a PCN reaction causing immediate rash, facial/tongue/throat swelling, SOB or lightheadedness with hypotension: Unknown Has patient had a PCN reaction causing severe rash involving mucus membranes or skin necrosis: Yes Has patient had a PCN reaction that required hospitalization: No Has patient had a PCN reaction occurring within the last 10 years: No If all of the above answers are "NO", then may proceed with Cephalosporin use.   Marland Kitchen Zofran [Ondansetron Hcl] Nausea Only    Medications Prior to Admission  Medication Sig Dispense Refill Last Dose  . acetaminophen (TYLENOL) 500 MG tablet Take 500 mg by mouth every 6 (six) hours as needed for moderate pain.  01/02/2020 at Unknown time  . albuterol (PROVENTIL HFA;VENTOLIN HFA) 108 (90 BASE) MCG/ACT inhaler Inhale 2 puffs into the lungs every 6 (six) hours as needed for wheezing or shortness of breath.   01/02/2020 at Unknown time  . mometasone (NASONEX) 50 MCG/ACT nasal spray Place 2 sprays into the nose 2 (two) times daily.    01/02/2020 at Unknown time  . Prenatal Vit-Fe Fumarate-FA (PRENATAL MULTIVITAMIN) TABS tablet Take 1 tablet by mouth daily at 12 noon.   01/02/2020 at Unknown time  . benzonatate (TESSALON) 100 MG capsule Take 1-2 capsules (100-200 mg total) by mouth 3 (three) times daily as needed. (Patient not taking: Reported on 11/24/2019) 60 capsule 0   . hydrOXYzine (ATARAX/VISTARIL) 25 MG tablet Take 1 tablet (25 mg total) by mouth every 6 (six) hours. (Patient not taking: Reported on 11/24/2019) 12 tablet 0   . promethazine-dextromethorphan  (PROMETHAZINE-DM) 6.25-15 MG/5ML syrup Take 5 mLs by mouth at bedtime as needed for cough. (Patient not taking: Reported on 11/24/2019) 100 mL 0   . triamterene-hydrochlorothiazide (MAXZIDE-25) 37.5-25 MG tablet Take 1 tablet by mouth daily. (Patient not taking: Reported on 11/24/2019) 30 tablet 0     Review of Systems  Constitutional: Negative for chills and fever.  Respiratory: Negative for cough and shortness of breath.   Gastrointestinal: Positive for abdominal pain and nausea (None currently). Negative for vomiting.  Genitourinary: Negative for difficulty urinating, dysuria, vaginal bleeding and vaginal discharge.  Musculoskeletal: Positive for back pain.  Neurological: Negative for dizziness, light-headedness and headaches.   Physical Exam   Blood pressure 132/81, pulse 88, temperature 99 F (37.2 C), temperature source Oral, resp. rate 16, height 5' (1.524 m), weight 71.9 kg, last menstrual period 11/18/2019, SpO2 100 %, currently breastfeeding.  Physical Exam Vitals and nursing note reviewed.  Constitutional:      General: She is not in acute distress.    Appearance: She is well-developed. She is not ill-appearing.  HENT:     Head: Normocephalic and atraumatic.  Eyes:     Conjunctiva/sclera: Conjunctivae normal.  Cardiovascular:     Rate and Rhythm: Normal rate and regular rhythm.     Heart sounds: Normal heart sounds.  Pulmonary:     Effort: Pulmonary effort is normal. No respiratory distress.     Breath sounds: Normal breath sounds.  Abdominal:     General: Bowel sounds are normal.     Tenderness: There is abdominal tenderness in the right lower quadrant and left lower quadrant.  Musculoskeletal:        General: Normal range of motion.     Cervical back: Normal range of motion.  Skin:    General: Skin is warm and dry.  Neurological:     Mental Status: She is alert and oriented to person, place, and time.  Psychiatric:        Mood and Affect: Mood normal.         Speech: Speech normal.        Behavior: Behavior normal.     MAU Course  Procedures Results for orders placed or performed during the hospital encounter of 01/02/20 (from the past 24 hour(s))  Pregnancy, urine POC     Status: Abnormal   Collection Time: 01/02/20  9:21 PM  Result Value Ref Range   Preg Test, Ur POSITIVE (A) NEGATIVE  Urinalysis, Routine w reflex microscopic Urine, Clean Catch     Status: Abnormal   Collection Time: 01/02/20  9:53 PM  Result Value Ref Range  Color, Urine YELLOW YELLOW   APPearance HAZY (A) CLEAR   Specific Gravity, Urine 1.023 1.005 - 1.030   pH 5.0 5.0 - 8.0   Glucose, UA NEGATIVE NEGATIVE mg/dL   Hgb urine dipstick NEGATIVE NEGATIVE   Bilirubin Urine NEGATIVE NEGATIVE   Ketones, ur NEGATIVE NEGATIVE mg/dL   Protein, ur NEGATIVE NEGATIVE mg/dL   Nitrite NEGATIVE NEGATIVE   Leukocytes,Ua NEGATIVE NEGATIVE  Wet prep, genital     Status: Abnormal   Collection Time: 01/02/20 10:43 PM  Result Value Ref Range   Yeast Wet Prep HPF POC NONE SEEN NONE SEEN   Trich, Wet Prep NONE SEEN NONE SEEN   Clue Cells Wet Prep HPF POC NONE SEEN NONE SEEN   WBC, Wet Prep HPF POC MANY (A) NONE SEEN   Sperm PRESENT     US OB LESS THAN 14 WEEKS WITH OB TRANSVAGINAL  Result Date: 01/02/2020 CLINICAL DATA:  Abdominal cramping EXAM: OBSTETRIC <14 WK [place] AND TRANSVAGINAL OB US TECHNIQUE: Both transabdominal and transvaginal ultrasound examinations were performed for complete evaluation of the gestation as well as the maternal uterus, adnexal regions, and pelvic cul-de-sac. Transvaginal technique was performed to assess early pregnancy. COMPARISON:  None. FINDINGS: Intrauterine gestational sac: Single Yolk sac:  Visualized. Embryo:  Visualized. Cardiac Activity: Visualized. Heart Rate: 122 bpm CRL: 3.8 mm   6 w   0 d                  [place] EDC: 08/27/2020 Subchorionic hemorrhage: Trace subchorionic hemorrhage along the right lateral aspect. Maternal uterus/adnexae: Right ovary  measures 5.0 x 2.8 x 2.8 cm, and the left ovary measures 3.3 x 2.1 x 2.2 cm. Corpus luteum cyst is noted in the right ovary. Trace pelvic free fluid. IMPRESSION: 1. Single live intrauterine pregnancy as above, estimated age 34 weeks and 0 days. 2. Trace subchorionic hemorrhage. Electronically Signed   By: Sharlet Salina M.D.   On: 01/02/2020 23:17    MDM Physical Exam Labs: UA, Wet Prep and GC/CT Ultrasound Pain Medication Prescription  Assessment and Plan  29 year old G2P0101 at 6.4 weeks Abdominal Cramping Headache-Tension Type  -POC Reviewed. -Informed that nurse will collect specimens as formal pelvic exam not necessary in absence of discharge and/or bleeding complaint. -Will send for [place] to confirm IUP. -Discussed HA symptoms and treatment.  Reviewed usage of Fioricet and it's composition. -Patient without q/c and is agreeable to treatment plan.  Cherre Robins 01/02/2020, 10:07 PM   Reassessment (11:58 PM) SIUP at 6.0weeks [facility]  -Results as above. -Provider to bedside to inform and discuss. -Patient reports improvement in HA and requests prescription to pharmacy.  -Informed that GC/CT pending. -Rx for fioricet sent to pharmacy on file.  -Educated on [facility], what to expect including bleeding, risks for miscarriage, and resolution. Patient reports she has had in previous pregnancies and is familiar with diagnosis. -Encouraged to call or return to MAU if symptoms worsen or with the onset of new symptoms. -Discharged to home in stable condition.  Cherre Robins MSN, CNM Advanced Practice Provider, [facility] [employer]

## 2020-01-03 MED ORDER — BUTALBITAL-APAP-CAFFEINE 50-325-40 MG PO TABS: 1.0000 | ORAL_TABLET | Freq: Four times a day (QID) | ORAL | 0 refills | Status: DC | PRN

## 2020-01-03 NOTE — Discharge Instructions (Signed)
Tension Headache, Adult A tension headache is a feeling of pain, pressure, or aching in the head that is often felt over the front and sides of the head. The pain can be dull, or it can feel tight (constricting). There are two types of tension headache:  Episodic tension headache. This is when the headaches happen fewer than 15 days a month.  Chronic tension headache. This is when the headaches happen more than 15 days a month during a 3-month period. A tension headache can last from 30 minutes to several days. It is the most common kind of headache. Tension headaches are not normally associated with nausea or vomiting, and they do not get worse with physical activity. What are the causes? The exact cause of this condition is not known. Tension headaches are often triggered by stress, anxiety, or depression. Other triggers include:  Alcohol.  Too much caffeine or caffeine withdrawal.  Respiratory infections, such as colds, flu, or sinus infections.  Dental problems or teeth clenching.  Tiredness (fatigue).  Holding your head and neck in the same position for a long period of time, such as while using a computer.  Smoking.  Arthritis of the neck. What are the signs or symptoms? Symptoms of this condition include:  A feeling of pressure or tightness around the head.  Dull, aching head pain.  Pain over the front and sides of the head.  Tenderness in the muscles of the head, neck, and shoulders. How is this diagnosed? This condition may be diagnosed based on your symptoms, your medical history, and a physical exam. If your symptoms are severe or unusual, you may have imaging tests, such as a CT scan or an MRI of your head. Your vision may also be checked. How is this treated? This condition may be treated with lifestyle changes and with medicines that help relieve symptoms. Follow these instructions at home: Managing pain  Take over-the-counter and prescription medicines only as  told by your health care provider.  When you have a headache, lie down in a dark, quiet room.  If directed, apply ice to the head and neck: ? Put ice in a plastic bag. ? Place a towel between your skin and the bag. ? Leave the ice on for 20 minutes, 2-3 times a day.  If directed, apply heat to the back of your neck as often as told by your health care provider. Use the heat source that your health care provider recommends, such as a moist heat pack or a heating pad. ? Place a towel between your skin and the heat source. ? Leave the heat on for 20-30 minutes. ? Remove the heat if your skin turns bright red. This is especially important if you are unable to feel pain, heat, or cold. You may have a greater risk of getting burned. Eating and drinking  Eat meals on a regular schedule.  Limit alcohol intake to no more than 1 drink a day for nonpregnant women and 2 drinks a day for men. One drink equals 12 oz of beer, 5 oz of wine, or 1 oz of hard liquor.  Drink enough fluid to keep your urine pale yellow.  Decrease your caffeine intake, or stop using caffeine. Lifestyle  Get 7-9 hours of sleep each night, or get the amount of sleep recommended by your health care provider.  At bedtime, remove all electronic devices from your room. Electronic devices include computers, phones, and tablets.  Find ways to manage your stress. Some things   that can help relieve stress include: ? Exercise. ? Deep breathing exercises. ? Yoga. ? Listening to music. ? Positive mental imagery.  Try to sit up straight and avoid tensing your muscles.  Do not use any products that contain nicotine or tobacco, such as cigarettes and e-cigarettes. If you need help quitting, ask your health care provider. General instructions   Keep all follow-up visits as told by your health care provider. This is important.  Avoid any headache triggers. Keep a headache journal to help find out what may trigger your headaches.  For example, write down: ? What you eat and drink. ? How much sleep you get. ? Any change to your diet or medicines. Contact a health care provider if:  Your headache does not get better.  Your headache comes back.  You are sensitive to sounds, light, or smells because of a headache.  You have nausea or you vomit.  Your stomach hurts. Get help right away if:  You suddenly develop a very severe headache along with any of the following: ? A stiff neck. ? Nausea and vomiting. ? Confusion. ? Weakness. ? Double vision or loss of vision. ? Shortness of breath. ? Rash. ? Unusual sleepiness. ? Fever. ? Trouble speaking. ? Pain in your eyes or ears. ? Trouble walking or balancing. ? Feeling faint or passing out. Summary  A tension headache is a feeling of pain, pressure, or aching in the head that is often felt over the front and sides of the head.  A tension headache can last from 30 minutes to several days. It is the most common kind of headache.  This condition may be diagnosed based on your symptoms, your medical history, and a physical exam.  This condition may be treated with lifestyle changes and with medicines that help relieve symptoms. This information is not intended to replace advice given to you by your health care provider. Make sure you discuss any questions you have with your health care provider. Document Revised: 11/12/2018 Document Reviewed: 04/27/2016 Elsevier Patient Education  2020 [employer]  

## 2020-01-04 LAB — GC/CHLAMYDIA PROBE AMP (~~LOC~~) NOT AT ARMC
Chlamydia: NEGATIVE
Comment: NEGATIVE
Comment: NORMAL
Neisseria Gonorrhea: NEGATIVE

## 2020-01-13 LAB — OB RESULTS CONSOLE HIV ANTIBODY (ROUTINE TESTING): HIV: NONREACTIVE

## 2020-01-13 LAB — OB RESULTS CONSOLE ANTIBODY SCREEN: Antibody Screen: NEGATIVE

## 2020-01-13 LAB — OB RESULTS CONSOLE HEPATITIS B SURFACE ANTIGEN: Hepatitis B Surface Ag: NEGATIVE

## 2020-01-13 LAB — OB RESULTS CONSOLE RPR: RPR: NONREACTIVE

## 2020-01-13 LAB — OB RESULTS CONSOLE ABO/RH: "RH Type ": POSITIVE

## 2020-01-13 LAB — OB RESULTS CONSOLE GC/CHLAMYDIA
Chlamydia: NEGATIVE
Gonorrhea: NEGATIVE

## 2020-01-13 LAB — OB RESULTS CONSOLE RUBELLA ANTIBODY, IGM: Rubella: IMMUNE

## 2020-01-29 ENCOUNTER — Inpatient Hospital Stay (HOSPITAL_COMMUNITY)
Admission: AD | Admit: 2020-01-29 | Discharge: 2020-01-29 | Disposition: A | Discharge status: Home / Self Care | Payer: Managed Care, Other (non HMO) | Attending: Obstetrics and Gynecology | Admitting: Obstetrics and Gynecology

## 2020-01-29 ENCOUNTER — Encounter (HOSPITAL_COMMUNITY): Payer: Self-pay | Admitting: Obstetrics and Gynecology

## 2020-01-29 ENCOUNTER — Other Ambulatory Visit: Payer: Self-pay

## 2020-01-29 DIAGNOSIS — O99351 Diseases of the nervous system complicating pregnancy, first trimester: Secondary | ICD-10-CM | POA: Insufficient documentation

## 2020-01-29 DIAGNOSIS — Z87891 Personal history of nicotine dependence: Secondary | ICD-10-CM | POA: Diagnosis not present

## 2020-01-29 DIAGNOSIS — Z3A10 10 weeks gestation of pregnancy: Secondary | ICD-10-CM | POA: Diagnosis not present

## 2020-01-29 DIAGNOSIS — Z3491 Encounter for supervision of normal pregnancy, unspecified, first trimester: Secondary | ICD-10-CM

## 2020-01-29 DIAGNOSIS — G44209 Tension-type headache, unspecified, not intractable: Secondary | ICD-10-CM

## 2020-01-29 LAB — URINALYSIS, ROUTINE W REFLEX MICROSCOPIC
Bilirubin Urine: NEGATIVE
Glucose, UA: NEGATIVE mg/dL
Hgb urine dipstick: NEGATIVE
Ketones, ur: NEGATIVE mg/dL
Nitrite: NEGATIVE
Protein, ur: NEGATIVE mg/dL
Specific Gravity, Urine: 1.021 (ref 1.005–1.030)
pH: 6 (ref 5.0–8.0)

## 2020-01-29 MED ORDER — DEXAMETHASONE SODIUM PHOSPHATE 10 MG/ML IJ SOLN
10.0000 mg | Freq: Once | INTRAMUSCULAR | Status: AC
  Administered 2020-01-29: 10 mg via INTRAVENOUS
  Filled 2020-01-29: qty 1

## 2020-01-29 MED ORDER — LACTATED RINGERS IV BOLUS
1000.0000 mL | Freq: Once | INTRAVENOUS | Status: AC
  Administered 2020-01-29: 1000 mL via INTRAVENOUS

## 2020-01-29 MED ORDER — CYCLOBENZAPRINE HCL 10 MG PO TABS: 10.0000 mg | ORAL_TABLET | Freq: Three times a day (TID) | ORAL | 1 refills | Status: DC | PRN

## 2020-01-29 MED ORDER — CYCLOBENZAPRINE HCL 5 MG PO TABS
10.0000 mg | ORAL_TABLET | Freq: Once | ORAL | Status: AC
  Administered 2020-01-29: 10 mg via ORAL
  Filled 2020-01-29: qty 2

## 2020-01-29 MED ORDER — ACETAMINOPHEN 500 MG PO TABS
1000.0000 mg | ORAL_TABLET | Freq: Once | ORAL | Status: AC
  Administered 2020-01-29: 1000 mg via ORAL
  Filled 2020-01-29: qty 2

## 2020-01-29 NOTE — MAU Provider Note (Signed)
Chief Complaint:  Hypertension   Event Date/Time   First Provider Initiated Contact with Patient 01/29/20 [phone]     HPI: Angelica Kim is a 29 y.o. G2P0101 at [redacted]w[redacted]d who presents to maternity admissions reporting severe headache that wraps around her head and radiates to her neck that began at noon today. She also states her BP has been elevated into the 130s-150s systolic using her home cuff. She denies visual disturbances, dizziness, epigastric or flank pain as well as cramping, vaginal bleeding or discharge  Past Medical History:  Diagnosis Date  . Ankylosing spondylitis (HCC)   . Asthma   . Fibromyalgia   . Kidney stones   . Migraine   . Ovarian cyst   . Pregnancy induced hypertension    OB History  Gravida Para Term Preterm AB Living  2 1 0 1 0 1  SAB IAB Ectopic Multiple Live Births  0 0 0 0 1    # Outcome Date GA Lbr Len/2nd Weight Sex Delivery Anes PTL Lv  2 Current           1 Preterm 07/31/19 [redacted]w[redacted]d 09:24 / 01:01 5 lb 12.9 oz (2.634 kg) F Vag-Spont EPI  LIV     Birth Comments: WNL   Past Surgical History:  Procedure Laterality Date  . COLONOSCOPY    . ESOPHAGOGASTRODUODENOSCOPY ENDOSCOPY    . spinal tap    . WISDOM TOOTH EXTRACTION    . WISDOM TOOTH EXTRACTION  2010   Family History  Problem Relation Age of Onset  . Arthritis Mother   . Hypertension Maternal Grandmother   . Thyroid disease Maternal Grandmother   . Kidney Stones Maternal Grandfather    Social History   Tobacco Use  . Smoking status: Former Smoker    Quit date: 2014    Years since quitting: 8.0  . Smokeless tobacco: Never Used  Vaping Use  . Vaping Use: Never used  Substance Use Topics  . Alcohol use: Not Currently    Alcohol/week: 0.0 standard drinks  . Drug use: No   Allergies  Allergen Reactions  . Amoxicillin Hives, Shortness Of Breath and Swelling    Swelling of hands, face, and lip Has patient had a PCN reaction causing immediate rash, facial/tongue/throat swelling, SOB or  lightheadedness with hypotension: Unknown Has patient had a PCN reaction causing severe rash involving mucus membranes or skin necrosis: Yes Has patient had a PCN reaction that required hospitalization: No Has patient had a PCN reaction occurring within the last 10 years: No If all of the above answers are "NO", then may proceed with Cephalosporin use.   Marland Kitchen Zofran [Ondansetron Hcl] Nausea Only   No medications prior to admission.    I have reviewed patient's Past Medical Hx, Surgical Hx, Family Hx, Social Hx, medications and allergies.   ROS:  Review of Systems  Gastrointestinal: Negative for nausea and vomiting.  Musculoskeletal: Positive for neck pain.  Neurological: Positive for headaches. Negative for dizziness, syncope and light-headedness.  All other systems reviewed and are negative.   Physical Exam   No data found.  Constitutional: Well-developed, well-nourished female in no acute distress.  Cardiovascular: normal rate & rhythm, no murmur Respiratory: normal effort, lung sounds clear throughout GI: Abd soft, non-tender, gravid appropriate for gestational age. Pos BS x 4 MS: Extremities nontender, no edema, normal ROM Neurologic: Alert and oriented x 4.  GU: no CVA tenderness Pelvic exam deferred  FHT unable to be detected by Doppler. Dr. Shawnie Pons called down to  assess with bedside ultrasound. Fetal heart movement noted on ultrasound. MD called out of the room prior to assessing rate.   Labs: No results found for this or any previous visit (from the past 24 hour(s)).  Imaging:  No results found.  MAU Course: Orders Placed This Encounter  Procedures  . Urinalysis, Routine w reflex microscopic Urine, Clean Catch  . Discharge patient   Meds ordered this encounter  Medications  . acetaminophen (TYLENOL) tablet 1,000 mg  . cyclobenzaprine (FLEXERIL) tablet 10 mg  . lactated ringers bolus 1,000 mL  . dexamethasone (DECADRON) injection 10 mg  . cyclobenzaprine  (FLEXERIL) 10 MG tablet    Sig: Take 1 tablet (10 mg total) by mouth every 8 (eight) hours as needed for muscle spasms.    Dispense:  30 tablet    Refill:  1    Order Specific Question:   Supervising Provider    Answer:   Reva Bores [[id number]]   MDM: LR bolus, tylenol and flexeril given with almost complete relief. Decadron given and patient reported the headache had eased.   Assessment: 1. Acute non intractable tension-type headache   2. Fetal heart tones present, first trimester    Plan: Discharge home in stable condition with return precautions.     Follow-up Information    [facility], [facility]. Go to.   Specialty: Obstetrics and Gynecology Why: as scheduled for ongoing prenatal care Contact information: [address] [place] [address] [phone]               Allergies as of 01/29/2020      Reactions   Amoxicillin Hives, Shortness Of Breath, Swelling   Swelling of hands, face, and lip Has patient had a PCN reaction causing immediate rash, facial/tongue/throat swelling, SOB or lightheadedness with hypotension: Unknown Has patient had a PCN reaction causing severe rash involving mucus membranes or skin necrosis: Yes Has patient had a PCN reaction that required hospitalization: No Has patient had a PCN reaction occurring within the last 10 years: No If all of the above answers are "NO", then may proceed with Cephalosporin use.   Zofran [ondansetron Hcl] Nausea Only      Medication List    TAKE these medications   acetaminophen 500 MG tablet Commonly known as: TYLENOL Take 500 mg by mouth every 6 (six) hours as needed for moderate pain.   albuterol 108 (90 Base) MCG/ACT inhaler Commonly known as: VENTOLIN HFA Inhale 2 puffs into the lungs every 6 (six) hours as needed for wheezing or shortness of breath.   butalbital-acetaminophen-caffeine 50-325-40 MG tablet Commonly known as: FIORICET Take 1-2 tablets by mouth every 6 (six) hours as  needed for headache.   cyclobenzaprine 10 MG tablet Commonly known as: FLEXERIL Take 1 tablet (10 mg total) by mouth every 8 (eight) hours as needed for muscle spasms.   mometasone 50 MCG/ACT nasal spray Commonly known as: NASONEX Place 2 sprays into the nose 2 (two) times daily.   prenatal multivitamin Tabs tablet Take 1 tablet by mouth daily at 12 noon.      Edd Arbour, MSN, CNM, [facility]

## 2020-01-29 NOTE — Discharge Instructions (Signed)
Tension Headache, Adult A tension headache is pain, pressure, or aching in your head. Tension headaches can last from 30 minutes to several days. Follow these instructions at home: Managing pain  Take over-the-counter and prescription medicines only as told by your doctor.  When you have a headache, lie down in a dark, quiet room.  If told, put ice on your head and neck: ? Put ice in a plastic bag. ? Place a towel between your skin and the bag. ? Leave the ice on for 20 minutes, 2-3 times a day.  If told, put heat on the back of your neck. Do this as often as your doctor tells you to. Use the kind of heat that your doctor recommends, such as a moist heat pack or a heating pad. ? Place a towel between your skin and the heat. ? Leave the heat on for 20-30 minutes. ? Remove the heat if your skin turns bright red. Eating and drinking  Eat meals on a regular schedule.  Watch how much alcohol you drink: ? If you are a woman and are not pregnant, do not drink more than 1 drink a day. ? If you are a man, do not drink more than 2 drinks a day.  Drink enough fluid to keep your pee (urine) pale yellow.  Do not use a lot of caffeine, or stop using caffeine. Lifestyle  Get enough sleep. Get 7-9 hours of sleep each night. Or get the amount of sleep that your doctor tells you to.  At bedtime, remove all electronic devices from your room. Examples of electronic devices are computers, phones, and tablets.  Find ways to lessen your stress. Some things that can lessen stress are: ? Exercise. ? Deep breathing. ? Yoga. ? Music. ? Positive thoughts.  Sit up straight. Do not tighten (tense) your muscles.  Do not use any products that have nicotine or tobacco in them, such as cigarettes and e-cigarettes. If you need help quitting, ask your doctor. General instructions   Keep all follow-up visits as told by your doctor. This is important.  Avoid things that can bring on headaches. Keep a  journal to find out if certain things bring on headaches. For example, write down: ? What you eat and drink. ? How much sleep you get. ? Any change to your diet or medicines. Contact a doctor if:  Your headache does not get better.  Your headache comes back.  You have a headache and sounds, light, or smells bother you.  You feel sick to your stomach (nauseous) or you throw up (vomit).  Your stomach hurts. Get help right away if:  You suddenly get a very bad headache along with any of these: ? A stiff neck. ? Feeling sick to your stomach. ? Throwing up. ? Feeling weak. ? Trouble seeing. ? Feeling short of breath. ? A rash. ? Feeling unusually sleepy. ? Trouble speaking. ? Pain in your eye or ear. ? Trouble walking or balancing. ? Feeling like you will pass out (faint). ? Passing out. Summary  A tension headache is pain, pressure, or aching in your head.  Tension headaches can last from 30 minutes to several days.  Lifestyle changes and medicines may help relieve pain. This information is not intended to replace advice given to you by your health care provider. Make sure you discuss any questions you have with your health care provider. Document Revised: 11/12/2018 Document Reviewed: 04/27/2016 Elsevier Patient Education  2020 Elsevier Inc.  

## 2020-01-29 NOTE — MAU Note (Signed)
Patient states since noon yesterday she has had a terrible HA that wraps around her head and radiates down her neck.  Took fioricet & tylenol w/o relief.  Also states her systolic BP has been between 130-150, which is elevated for her.  Denies visual disturbances.  Endorses + history of migraines but this feels different.  Denies any VB/cramping.

## 2020-01-30 NOTE — L&D Delivery Note (Signed)
Delivery Note Labor onset: 08/04/2020  Labor Onset Time: 0119 Complete dilation at 3:56 AM  Onset of pushing at 0406 FHR second stage Cat 2 Analgesia/Anesthesia intrapartum: epidural  Cat 2-3 fetal surveillance with poor maternal pushing efforts. FHR does not respond to intrauterine resuscitation. Called for vacuum assistance from in-house provider. Notified Dr. Richardson Dopp of need for urgent delivery. Verbal consent for assisted delivery provided to pt. Dr. Despina Hidden to bedside (see note from MD). Vacuum assisted delivery of a viable female at 18. Fetal head delivered in direct OA position.  Nuchal cord: none.  Infant placed on maternal abd, lusty cry w/ good tone at birth. Infant dried, and tactile stim.  Cord double clamped after 1 min and cut by Father.  Father present for birth.  Cord blood sample collected: Yes Arterial cord blood sample collected: Yes, sent with RT and notified that sample clotted and could not be resulted.  Placenta delivered Schultz side, intact, with 3 VC.  Placenta to pathology for GHTN and terminal bradycardia. Uterine tone firm, bleeding moderate prior to placenta. TXA administered.  No laceration identified.  Anesthesia: N/A Repair: N/A EBL (mL): 400 Complications: terminal bradycardia APGAR: APGAR (1 MIN): 9   APGAR (5 MINS): 9   APGAR (10 MINS):   Mom to postpartum.  Baby to Couplet care / Skin to Skin.  [name] MSN, CNM 08/04/2020, 5:00 AM

## 2020-02-07 DIAGNOSIS — Z8751 Personal history of pre-term labor: Secondary | ICD-10-CM

## 2020-02-07 DIAGNOSIS — Z880 Allergy status to penicillin: Secondary | ICD-10-CM | POA: Insufficient documentation

## 2020-02-07 HISTORY — DX: Allergy status to penicillin: Z88.0

## 2020-02-07 HISTORY — DX: Personal history of pre-term labor: Z87.51

## 2020-02-13 ENCOUNTER — Other Ambulatory Visit: Payer: Self-pay

## 2020-02-13 ENCOUNTER — Inpatient Hospital Stay (HOSPITAL_COMMUNITY)
Admission: AD | Admit: 2020-02-13 | Discharge: 2020-02-13 | Disposition: A | Discharge status: Home / Self Care | Payer: Managed Care, Other (non HMO) | Attending: Obstetrics and Gynecology | Admitting: Obstetrics and Gynecology

## 2020-02-13 ENCOUNTER — Encounter (HOSPITAL_COMMUNITY): Payer: Self-pay | Admitting: Obstetrics and Gynecology

## 2020-02-13 DIAGNOSIS — O26891 Other specified pregnancy related conditions, first trimester: Secondary | ICD-10-CM | POA: Insufficient documentation

## 2020-02-13 DIAGNOSIS — R1032 Left lower quadrant pain: Secondary | ICD-10-CM | POA: Diagnosis not present

## 2020-02-13 DIAGNOSIS — R509 Fever, unspecified: Secondary | ICD-10-CM | POA: Diagnosis not present

## 2020-02-13 DIAGNOSIS — Z3491 Encounter for supervision of normal pregnancy, unspecified, first trimester: Secondary | ICD-10-CM

## 2020-02-13 DIAGNOSIS — Z880 Allergy status to penicillin: Secondary | ICD-10-CM | POA: Diagnosis not present

## 2020-02-13 DIAGNOSIS — Z87891 Personal history of nicotine dependence: Secondary | ICD-10-CM | POA: Insufficient documentation

## 2020-02-13 DIAGNOSIS — O26851 Spotting complicating pregnancy, first trimester: Secondary | ICD-10-CM

## 2020-02-13 DIAGNOSIS — Z20822 Contact with and (suspected) exposure to covid-19: Secondary | ICD-10-CM | POA: Insufficient documentation

## 2020-02-13 DIAGNOSIS — Z3A12 12 weeks gestation of pregnancy: Secondary | ICD-10-CM | POA: Diagnosis not present

## 2020-02-13 DIAGNOSIS — O99891 Other specified diseases and conditions complicating pregnancy: Secondary | ICD-10-CM | POA: Diagnosis not present

## 2020-02-13 LAB — WET PREP, GENITAL
Clue Cells Wet Prep HPF POC: NONE SEEN
Sperm: NONE SEEN
Trich, Wet Prep: NONE SEEN
Yeast Wet Prep HPF POC: NONE SEEN

## 2020-02-13 LAB — CBC WITH DIFFERENTIAL/PLATELET
Abs Immature Granulocytes: 0.09 10*3/uL — ABNORMAL HIGH (ref 0.00–0.07)
Basophils Absolute: 0.1 10*3/uL (ref 0.0–0.1)
Basophils Relative: 0 %
Eosinophils Absolute: 0.1 10*3/uL (ref 0.0–0.5)
Eosinophils Relative: 1 %
HCT: 36.7 % (ref 36.0–46.0)
Hemoglobin: 12.7 g/dL (ref 12.0–15.0)
Immature Granulocytes: 1 %
Lymphocytes Relative: 25 %
Lymphs Abs: 4 10*3/uL (ref 0.7–4.0)
MCH: 30 pg (ref 26.0–34.0)
MCHC: 34.6 g/dL (ref 30.0–36.0)
MCV: 86.8 fL (ref 80.0–100.0)
Monocytes Absolute: 0.9 10*3/uL (ref 0.1–1.0)
Monocytes Relative: 6 %
Neutro Abs: 11 10*3/uL — ABNORMAL HIGH (ref 1.7–7.7)
Neutrophils Relative %: 67 %
Platelets: 221 10*3/uL (ref 150–400)
RBC: 4.23 MIL/uL (ref 3.87–5.11)
RDW: 12.6 % (ref 11.5–15.5)
WBC: 16.2 10*3/uL — ABNORMAL HIGH (ref 4.0–10.5)
nRBC: 0 % (ref 0.0–0.2)

## 2020-02-13 LAB — URINALYSIS, ROUTINE W REFLEX MICROSCOPIC
Bilirubin Urine: NEGATIVE
Glucose, UA: NEGATIVE mg/dL
Hgb urine dipstick: NEGATIVE
Ketones, ur: NEGATIVE mg/dL
Leukocytes,Ua: NEGATIVE
Nitrite: NEGATIVE
Protein, ur: NEGATIVE mg/dL
Specific Gravity, Urine: 1.011 (ref 1.005–1.030)
pH: 6 (ref 5.0–8.0)

## 2020-02-13 LAB — SARS CORONAVIRUS 2 (TAT 6-24 HRS): SARS Coronavirus 2: NEGATIVE

## 2020-02-13 NOTE — MAU Note (Signed)
Patient reports a fever for the past 24 hours. The highest temp reported was 102 F yesterday. Today 101 F. Temperature is controlled with tylenol at home. Patient also reports lower left abdominal pain with smears of blood (red/pink) when wiping. Patient denies any leaking of fluid. Triage completed. Provider to be notified

## 2020-02-13 NOTE — Discharge Instructions (Signed)

## 2020-02-13 NOTE — MAU Provider Note (Signed)
History     CSN: [id number]  Arrival date and time: 02/13/20 0208   Event Date/Time   First Provider Initiated Contact with Patient 02/13/20 0249      Chief Complaint  Patient presents with  . Abdominal Pain  . Fever   HPI Angelica Kim is a 30 y.o. G2P0101 at [redacted]w[redacted]d who presents to MAU with chief complaints of LLQ pain, fever and vaginal spotting. She receives prenatal care with [facility].  LLQ pain This is a new problem, onset last night 02/12/2020. Patient's pain is rated as 6/10 and radiates to her left mid-back. She denies aggravating or alleviating factors. She has not taken medication or tried other treatments for this complaint. She works as a Radiation protection practitioner and states her partner knows she is pregnant, denies heavy lifting, opportunities for muscle strain.  Fever This is a new problem, onset about 24 hours ago. Patient reports TMAX of 102. She has been managing her fever with PO Tylenol. She denies exposure to COVID positive and/or febrile people.  Vaginal spotting This is a new problem, onset last night 02/12/2020 around 1830 hours. Patient states she only sees smears of blood when she wipes.  OB History    Gravida  2   Para  1   Term  0   Preterm  1   AB  0   Living  1     SAB  0   IAB  0   Ectopic  0   Multiple  0   Live Births  1           Past Medical History:  Diagnosis Date  . Ankylosing spondylitis (HCC)   . Asthma   . Fibromyalgia   . Kidney stones   . Migraine   . Ovarian cyst   . Preterm labor     Past Surgical History:  Procedure Laterality Date  . COLONOSCOPY    . ESOPHAGOGASTRODUODENOSCOPY ENDOSCOPY    . spinal tap    . WISDOM TOOTH EXTRACTION    . WISDOM TOOTH EXTRACTION  2010    Family History  Problem Relation Age of Onset  . Arthritis Mother   . Hypertension Maternal Grandmother   . Thyroid disease Maternal Grandmother   . Kidney Stones Maternal Grandfather     Social History   Tobacco Use  . Smoking status: Former  Smoker    Quit date: 2014    Years since quitting: 8.0  . Smokeless tobacco: Never Used  Vaping Use  . Vaping Use: Never used  Substance Use Topics  . Alcohol use: Not Currently    Alcohol/week: 0.0 standard drinks  . Drug use: No    Allergies:  Allergies  Allergen Reactions  . Amoxicillin Hives, Shortness Of Breath and Swelling    Swelling of hands, face, and lip Has patient had a PCN reaction causing immediate rash, facial/tongue/throat swelling, SOB or lightheadedness with hypotension: Unknown Has patient had a PCN reaction causing severe rash involving mucus membranes or skin necrosis: Yes Has patient had a PCN reaction that required hospitalization: No Has patient had a PCN reaction occurring within the last 10 years: No If all of the above answers are "NO", then may proceed with Cephalosporin use.   Marland Kitchen Zofran [Ondansetron Hcl] Nausea Only    Medications Prior to Admission  Medication Sig Dispense Refill Last Dose  . acetaminophen (TYLENOL) 500 MG tablet Take 500 mg by mouth every 6 (six) hours as needed for moderate pain.    02/13/2020 at  Unknown time  . albuterol (PROVENTIL HFA;VENTOLIN HFA) 108 (90 BASE) MCG/ACT inhaler Inhale 2 puffs into the lungs every 6 (six) hours as needed for wheezing or shortness of breath.   02/13/2020 at Unknown time  . butalbital-acetaminophen-caffeine (FIORICET) 50-325-40 MG tablet Take 1-2 tablets by mouth every 6 (six) hours as needed for headache. 20 tablet 0 Past Month at Unknown time  . cyclobenzaprine (FLEXERIL) 10 MG tablet Take 1 tablet (10 mg total) by mouth every 8 (eight) hours as needed for muscle spasms. 30 tablet 1 Past Month at Unknown time  . mometasone (NASONEX) 50 MCG/ACT nasal spray Place 2 sprays into the nose 2 (two) times daily.    02/13/2020 at Unknown time  . Prenatal Vit-Fe Fumarate-FA (PRENATAL MULTIVITAMIN) TABS tablet Take 1 tablet by mouth daily at 12 noon.   02/13/2020 at Unknown time    Review of Systems   Constitutional: Positive for fever. Negative for chills.  Respiratory: Negative for shortness of breath.   Cardiovascular: Negative for palpitations.  Gastrointestinal: Positive for abdominal pain.  Genitourinary: Positive for vaginal bleeding. Negative for difficulty urinating and flank pain.  Neurological: Negative for syncope and weakness.  All other systems reviewed and are negative.  Physical Exam   Blood pressure 118/67, pulse 96, resp. rate 17, last menstrual period 11/18/2019, currently breastfeeding.  Physical Exam Vitals and nursing note reviewed. Exam conducted with a chaperone present.  Constitutional:      Appearance: She is well-developed.  Cardiovascular:     Rate and Rhythm: Normal rate.     Heart sounds: Normal heart sounds.  Pulmonary:     Effort: Pulmonary effort is normal.     Breath sounds: Normal breath sounds.  Abdominal:     General: Abdomen is flat. Bowel sounds are normal.     Palpations: Abdomen is soft.     Tenderness: There is no abdominal tenderness. There is no right CVA tenderness, left CVA tenderness or guarding.  Skin:    General: Skin is warm and dry.     Capillary Refill: Capillary refill takes less than 2 seconds.  Neurological:     Mental Status: She is alert and oriented to person, place, and time.  Psychiatric:        Mood and Affect: Mood normal.        Behavior: Behavior normal.     MAU Course  Procedures  Orders Placed This Encounter  Procedures  . Wet prep, genital  . SARS Coronavirus 2 (TAT 6-24 hrs)  . Urinalysis, Routine w reflex microscopic  . CBC with Differential/Platelet  . Airborne and Contact precautions   Patient Vitals for the past 24 hrs:  BP Pulse Resp  02/13/20 0439 119/72 90 18  02/13/20 0309 118/67 96 17  02/13/20 0251 116/69 97 17   Results for orders placed or performed during the hospital encounter of 02/13/20 (from the past 24 hour(s))  Urinalysis, Routine w reflex microscopic Urine, Clean Catch      Status: None   Collection Time: 02/13/20  3:16 AM  Result Value Ref Range   Color, Urine YELLOW YELLOW   APPearance CLEAR CLEAR   Specific Gravity, Urine 1.011 1.005 - 1.030   pH 6.0 5.0 - 8.0   Glucose, UA NEGATIVE NEGATIVE mg/dL   Hgb urine dipstick NEGATIVE NEGATIVE   Bilirubin Urine NEGATIVE NEGATIVE   Ketones, ur NEGATIVE NEGATIVE mg/dL   Protein, ur NEGATIVE NEGATIVE mg/dL   Nitrite NEGATIVE NEGATIVE   Leukocytes,Ua NEGATIVE NEGATIVE  CBC with  Differential/Platelet     Status: Abnormal   Collection Time: 02/13/20  3:16 AM  Result Value Ref Range   WBC 16.2 (H) 4.0 - 10.5 K/uL   RBC 4.23 3.87 - 5.11 MIL/uL   Hemoglobin 12.7 12.0 - 15.0 g/dL   HCT 83.1 51.7 - 61.6 %   MCV 86.8 80.0 - 100.0 fL   MCH 30.0 26.0 - 34.0 pg   MCHC 34.6 30.0 - 36.0 g/dL   RDW 07.3 71.0 - 62.6 %   Platelets 221 150 - 400 K/uL   nRBC 0.0 0.0 - 0.2 %   Neutrophils Relative % 67 %   Neutro Abs 11.0 (H) 1.7 - 7.7 K/uL   Lymphocytes Relative 25 %   Lymphs Abs 4.0 0.7 - 4.0 K/uL   Monocytes Relative 6 %   Monocytes Absolute 0.9 0.1 - 1.0 K/uL   Eosinophils Relative 1 %   Eosinophils Absolute 0.1 0.0 - 0.5 K/uL   Basophils Relative 0 %   Basophils Absolute 0.1 0.0 - 0.1 K/uL   Immature Granulocytes 1 %   Abs Immature Granulocytes 0.09 (H) 0.00 - 0.07 K/uL  Wet prep, genital     Status: Abnormal   Collection Time: 02/13/20  3:16 AM   Specimen: Urine, Clean Catch  Result Value Ref Range   Yeast Wet Prep HPF POC NONE SEEN NONE SEEN   Trich, Wet Prep NONE SEEN NONE SEEN   Clue Cells Wet Prep HPF POC NONE SEEN NONE SEEN   WBC, Wet Prep HPF POC FEW (A) NONE SEEN   Sperm NONE SEEN    Assessment and Plan  --30 y.o. G2P0101 at [redacted]w[redacted]d  --FHT 150 by Doppler --Fever or unknown origin --COVID swab in work --No other acute concerns --Discharge home in stable condition  Calvert Cantor, CNM 02/13/2020, 5:02 AM

## 2020-02-16 LAB — GC/CHLAMYDIA PROBE AMP (~~LOC~~) NOT AT ARMC
Chlamydia: NEGATIVE
Comment: NEGATIVE
Comment: NORMAL
Neisseria Gonorrhea: NEGATIVE

## 2020-04-01 ENCOUNTER — Other Ambulatory Visit: Payer: Self-pay

## 2020-04-01 ENCOUNTER — Encounter (HOSPITAL_COMMUNITY): Payer: Self-pay | Admitting: Pharmacy Technician

## 2020-04-01 ENCOUNTER — Emergency Department (HOSPITAL_COMMUNITY)
Admission: EM | Admit: 2020-04-01 | Discharge: 2020-04-01 | Disposition: A | Discharge status: Home / Self Care | Payer: Managed Care, Other (non HMO) | Attending: Emergency Medicine | Admitting: Emergency Medicine

## 2020-04-01 ENCOUNTER — Inpatient Hospital Stay (EMERGENCY_DEPARTMENT_HOSPITAL)
Admission: AD | Admit: 2020-04-01 | Discharge: 2020-04-01 | Disposition: A | Discharge status: Home / Self Care | Payer: Managed Care, Other (non HMO) | Source: Home / Self Care | Attending: Obstetrics & Gynecology | Admitting: Obstetrics & Gynecology

## 2020-04-01 DIAGNOSIS — M549 Dorsalgia, unspecified: Secondary | ICD-10-CM

## 2020-04-01 DIAGNOSIS — M545 Low back pain, unspecified: Secondary | ICD-10-CM | POA: Insufficient documentation

## 2020-04-01 DIAGNOSIS — Z3A19 19 weeks gestation of pregnancy: Secondary | ICD-10-CM | POA: Insufficient documentation

## 2020-04-01 DIAGNOSIS — J45909 Unspecified asthma, uncomplicated: Secondary | ICD-10-CM | POA: Diagnosis not present

## 2020-04-01 DIAGNOSIS — O26892 Other specified pregnancy related conditions, second trimester: Secondary | ICD-10-CM | POA: Insufficient documentation

## 2020-04-01 DIAGNOSIS — Z87891 Personal history of nicotine dependence: Secondary | ICD-10-CM | POA: Diagnosis not present

## 2020-04-01 DIAGNOSIS — W1849XA Other slipping, tripping and stumbling without falling, initial encounter: Secondary | ICD-10-CM | POA: Insufficient documentation

## 2020-04-01 DIAGNOSIS — Z3492 Encounter for supervision of normal pregnancy, unspecified, second trimester: Secondary | ICD-10-CM

## 2020-04-01 DIAGNOSIS — O99892 Other specified diseases and conditions complicating childbirth: Secondary | ICD-10-CM | POA: Insufficient documentation

## 2020-04-01 DIAGNOSIS — O99891 Other specified diseases and conditions complicating pregnancy: Secondary | ICD-10-CM

## 2020-04-01 MED ORDER — OXYCODONE HCL 5 MG PO CAPS
5.0000 mg | ORAL_CAPSULE | ORAL | 0 refills | Status: DC | PRN
Start: 2020-04-01 — End: 2020-04-04

## 2020-04-01 MED ORDER — LIDOCAINE 5 % EX PTCH: 1.0000 | MEDICATED_PATCH | CUTANEOUS | 0 refills | Status: DC

## 2020-04-01 NOTE — Discharge Instructions (Addendum)
You came to the emergency department today to be evaluated for your low back pain.  Your physical exam was reassuring.  Your back pain is likely musculoskeletal in nature and should improve with time.  Please take Tylenol (acetaminophen) to relieve your pain.  You make take tylenol, up to 1,000 mg (two extra strength pills) every 8 hours as needed.    Do not take more than 3,000 mg tylenol in a 24 hour period (not more than one dose every 8 hours.  Please check all medication labels as many medications such as pain and cold medications may contain tylenol.  Do not drink alcohol while taking these medications.   I have also given you a prescription for lidocaine patch.  You may apply one patch every 12 hours as needed for pain.  Please also try ice and/or heat to relieve your symptoms.  Please follow-up with your primary care provider if your symptoms do not improve.  Get help right away if: You develop new bowel or bladder control problems. You have unusual weakness or numbness in your arms or legs. You develop nausea or vomiting. You develop abdominal pain. You feel faint.

## 2020-04-01 NOTE — ED Provider Notes (Signed)
[facility] EMERGENCY DEPARTMENT Provider Note   CSN: [id number] Arrival date & time: 04/01/20  1321     History Chief Complaint  Patient presents with  . Back Pain    Angelica Kim is a 30 y.o. female with history of ankylosing spondylitis, asthma, fibromyalgia.  She is [redacted] weeks pregnant.  Per chart review patient was seen at MAU earlier today patient was encouraged to rest, use heating pad, told that she can use ibuprofen, given a referral for chiropractor, and sent a limited number of oxycodone to pharmacy.  Fetal eart rate was found to be 152 upon assessment at MAU.    Presents with a chief complaint of left lumbar back pain.  Patient reports that her pain began yesterday.  Patient reports that she stepped on 1 foot off a porch and felt a "pop," in her low back.  Patient reports since then she has had constant pain to her left lumbar.  Pain wraps around to her left flank.  Pain is gradually gotten progressively worse.  Patient states that pain is worse when she moves or tries to stand straight.  When moving she rates her pain 8/10 on the pain scale.  When at rest she reports pain is 3/10 on the pain scale.    She reports that she had a similar episode of low back pain 3 to 4 years prior.  Ports that she saw him/therapist and symptoms resolved over a matter of days.  Patient denies any falls or traumatic injuries.  Patient denies any recent heavy lifting or strenuous activity.    Patient denies any numbness or tingling to extremities, weakness to extremities, saddle anesthesia, bowel or bladder dysfunction,  visual disturbance, nausea, vomiting, neck pain.    Patient denies any alcohol or illicit drug use.  HPI     Past Medical History:  Diagnosis Date  . Ankylosing spondylitis (HCC)   . Asthma   . Fibromyalgia   . Kidney stones   . Migraine   . Ovarian cyst   . Preterm labor     Patient Active Problem List   Diagnosis Date Noted  . SVD (spontaneous vaginal  delivery) 08/01/2019  . Postpartum care following vaginal delivery 7/2 08/01/2019  . Perineal laceration, second degree 08/01/2019  . Preeclampsia, severe 07/30/2019  . Generalized anxiety disorder 04/07/2014  . Migraines 12/21/2013  . Low back pain 12/21/2013  . Cognitive changes 12/21/2013    Past Surgical History:  Procedure Laterality Date  . COLONOSCOPY    . ESOPHAGOGASTRODUODENOSCOPY ENDOSCOPY    . spinal tap    . WISDOM TOOTH EXTRACTION    . WISDOM TOOTH EXTRACTION  2010     OB History    Gravida  2   Para  1   Term  0   Preterm  1   AB  0   Living  1     SAB  0   IAB  0   Ectopic  0   Multiple  0   Live Births  1           Family History  Problem Relation Age of Onset  . Arthritis Mother   . Hypertension Maternal Grandmother   . Thyroid disease Maternal Grandmother   . Kidney Stones Maternal Grandfather     Social History   Tobacco Use  . Smoking status: Former Smoker    Quit date: 2014    Years since quitting: 8.1  . Smokeless tobacco: Never Used  Vaping Use  . Vaping Use: Never used  Substance Use Topics  . Alcohol use: Not Currently    Alcohol/week: 0.0 standard drinks  . Drug use: No    Home Medications Prior to Admission medications   Medication Sig Start Date End Date Taking? Authorizing Provider  lidocaine (LIDODERM) 5 % Place 1 patch onto the skin daily. Remove & Discard patch within 12 hours or as directed by MD 04/01/20  Yes Haskel SchroederBadalamente, Vianka Ertel R, PA-C  acetaminophen (TYLENOL) 500 MG tablet Take 500 mg by mouth every 6 (six) hours as needed for moderate pain.     [provider]  albuterol (PROVENTIL HFA;VENTOLIN HFA) 108 (90 BASE) MCG/ACT inhaler Inhale 2 puffs into the lungs every 6 (six) hours as needed for wheezing or shortness of breath.    [provider]  butalbital-acetaminophen-caffeine (FIORICET) 50-325-40 MG tablet Take 1-2 tablets by mouth every 6 (six) hours as needed for headache. 01/03/20  01/02/21  Gerrit HeckEmly, Jessica, CNM  cyclobenzaprine (FLEXERIL) 10 MG tablet Take 1 tablet (10 mg total) by mouth every 8 (eight) hours as needed for muscle spasms. 01/29/20   Walker, Harvin HazelJamilla R, CNM  mometasone (NASONEX) 50 MCG/ACT nasal spray Place 2 sprays into the nose 2 (two) times daily.     [provider]  oxycodone (OXY-IR) 5 MG capsule Take 1 capsule (5 mg total) by mouth every 4 (four) hours as needed. 04/01/20   Bernerd LimboWalker, Jamilla R, CNM  Prenatal Vit-Fe Fumarate-FA (PRENATAL MULTIVITAMIN) TABS tablet Take 1 tablet by mouth daily at 12 noon.    [provider]  metoCLOPramide (REGLAN) 10 MG tablet Take 1 tablet (10 mg total) by mouth every 6 (six) hours. 03/22/17 07/31/18  [name], Mia A, PA-C    Allergies    Amoxicillin and Zofran [ondansetron hcl]  Review of Systems   Review of Systems  Constitutional: Negative for chills and fever.  Eyes: Negative for visual disturbance.  Respiratory: Negative for shortness of breath.   Cardiovascular: Negative for chest pain.  Gastrointestinal: Negative for abdominal pain, nausea and vomiting.  Genitourinary: Positive for flank pain. Negative for difficulty urinating, dysuria, hematuria, vaginal bleeding, vaginal discharge and vaginal pain.  Musculoskeletal: Positive for back pain. Negative for neck pain.  Skin: Negative for color change and rash.  Neurological: Negative for dizziness, syncope, light-headedness and headaches.  Psychiatric/Behavioral: Negative for confusion.    Physical Exam Updated Vital Signs BP 110/66 (BP Location: Left Arm)   Pulse 100   Temp 98.3 F (36.8 C)   Resp 16   LMP 11/18/2019 (Exact Date)   SpO2 98%   Physical Exam Vitals and nursing note reviewed.  Constitutional:      General: She is not in acute distress.    Appearance: She is not ill-appearing, toxic-appearing or diaphoretic.  HENT:     Head: Normocephalic and atraumatic.  Eyes:     General: No scleral icterus.       Right eye: No  discharge.        Left eye: No discharge.     Extraocular Movements: Extraocular movements intact.     Pupils: Pupils are equal, round, and reactive to light.  Cardiovascular:     Rate and Rhythm: Normal rate.  Pulmonary:     Effort: Pulmonary effort is normal.  Abdominal:     Palpations: Abdomen is soft.     Tenderness: There is no abdominal tenderness.  Musculoskeletal:     Cervical back: Normal range of motion and neck supple. No  swelling, edema, deformity, erythema, signs of trauma, spasms, torticollis, tenderness, bony tenderness or crepitus. No pain with movement. Normal range of motion.     Thoracic back: No swelling, edema, deformity, signs of trauma, lacerations, spasms, tenderness or bony tenderness. Normal range of motion.     Lumbar back: Tenderness present. No swelling, edema, deformity, signs of trauma, lacerations, spasms or bony tenderness. Normal range of motion. Negative right straight leg raise test and negative left straight leg raise test. No scoliosis.     Right lower leg: No edema.     Left lower leg: No edema.     Comments: Tenderness to left lumbar  No tender spinous process, deformity, step-off, midline tenderness to cervical, thoracic, or lumbar back.  Patient was able to be standing with slightly flexed lumbar spine however was able to extend with complaints of pain  Skin:    General: Skin is warm and dry.     Coloration: Skin is not jaundiced or pale.  Neurological:     General: No focal deficit present.     Mental Status: She is alert.     GCS: GCS eye subscore is 4. GCS verbal subscore is 5. GCS motor subscore is 6.     Cranial Nerves: No cranial nerve deficit or facial asymmetry.     Sensory: Sensation is intact.     Motor: No weakness, tremor, seizure activity or pronator drift.     Coordination: Romberg sign negative. Finger-Nose-Finger Test normal.     Gait: Gait is intact. Gait normal.     Comments: CN II-XII intact, equal grip strength, +5  strength to bilateral upper and lower extremities    She was noted to ambulate from waiting room to hospital bed without difficulty  Psychiatric:        Behavior: Behavior is cooperative.     ED Results / Procedures / Treatments   Labs (all labs ordered are listed, but only abnormal results are displayed) Labs Reviewed - No data to display  EKG None  Radiology No results found.  Procedures Procedures   Medications Ordered in ED Medications - No data to display  ED Course  I have reviewed the triage vital signs and the nursing notes.  Pertinent labs & imaging results that were available during my care of the patient were reviewed by me and considered in my medical decision making (see chart for details).    MDM Rules/Calculators/A&P                          Alert 30 year old female in no acute distress, nontoxic appearing.  Patient appears uncomfortable due to her complaint of left lumbar back pain.  Patient denies any falls or traumatic injury.  Patient denies any numbness or tingling to extremities, weakness to extremities, saddle anesthesia, bowel or bladder dysfunction,  visual disturbance, nausea, vomiting, neck pain.    Per chart review patient was seen at MAU earlier today patient was encouraged to rest, use heating pad, told that she can use ibuprofen, given a referral for chiropractor, and sent a limited number of oxycodone to pharmacy.    No tender spinous process, deformity, step-off, midline tenderness to cervical, thoracic, or lumbar back.  CN II-XII intact, equal grip strength, +5 strength to bilateral upper and lower extremities.  She was noted to ambulate from waiting room to hospital bed without difficulty   Low suspicion for cauda equina syndrome as patient had no traumatic injury; she denies  any saddle anesthesia, bowel or bladder dysfunction, numbness or tingling to extremities, weakness to extremities.  Low suspicion for epidural abscess as patient denies  any IV drug use, is afebrile.  Patient's back pain is likely musculoskeletal in nature.  Patient was advised to use Tylenol as needed for her pain management.  Patient was given prescription for lidocaine patch.  Patient was advised to use heat and light stretching to help with her symptoms.  Patient was advised to follow-up with her primary care provider if her symptoms did not improve.  Received referral to chiropractor from MAU provider.  Patient received oxycodone prescription from MAU provider.  Patient expressed understanding of all instructions and is agreeable with plan.     Final Clinical Impression(s) / ED Diagnoses Final diagnoses:  Acute left-sided low back pain without sciatica    Rx / DC Orders ED Discharge Orders         Ordered    lidocaine (LIDODERM) 5 %  Every 24 hours        04/01/20 1555           Berneice Heinrich 04/01/20 2325    Terrilee Files, MD 04/02/20 [phone]

## 2020-04-01 NOTE — MAU Provider Note (Addendum)
S Angelica Kim is a 30 y.o. G65P0101 pregnant female at [redacted]w[redacted]d who presents to MAU today with complaint of back pain since yesterday when she stepped off a step wrong and tweaked her back. She did not fall, just tripped and side stepped. She has had this happen before while non-pregnant and it took days to resolve. Expressed frustration that her community OB would not see her, she does not believe urgent care will see her and cannot stand completely upright. Has not felt baby move since the back pain began and had been feeling daily movements. Denies abdominal cramping/discomfort, vaginal discharge/bleeding, or LOF. No other physical complaints.  O BP 114/69 (BP Location: Right Arm)   Pulse (!) 104   Temp 98.6 F (37 C) (Oral)   Resp 20   Ht 5' (1.524 m)   Wt 162 lb 14.4 oz (73.9 kg)   LMP 11/18/2019 (Exact Date)   SpO2 99%   BMI 31.81 kg/m  Physical Exam Vitals and nursing note reviewed.  Constitutional:      General: She is in acute distress (hunched over sideways due to back pain, tearful that we cannot fix her back today).     Appearance: She is not ill-appearing, toxic-appearing or diaphoretic.  Eyes:     Pupils: Pupils are equal, round, and reactive to light.  Cardiovascular:     Rate and Rhythm: Normal rate.  Pulmonary:     Effort: Pulmonary effort is normal.  Musculoskeletal:     Cervical back: Normal range of motion.  Skin:    General: Skin is warm and dry.     Capillary Refill: Capillary refill takes less than 2 seconds.  Neurological:     Mental Status: She is alert and oriented to person, place, and time.  Psychiatric:        Mood and Affect: Mood normal.        Behavior: Behavior normal.        Thought Content: Thought content normal.        Judgment: Judgment normal.   FHR: 152  A Back pain in pregnancy Medical screening exam complete  P Discharge from MAU in stable condition with 2nd trimester precautions Encouraged to rest, use a heating pad, can take  ibuprofen in 2nd trimester, and see a chiropractor. Chiropractor referral given Sent limited number of oxycodone to pharmacy Follow up with [facility] for orthopedic referral if needed Warning signs for worsening condition that would warrant emergency follow-up discussed Patient may return to MAU as needed for emergent OB/GYN related complaints  Bernerd Limbo, CNM 04/01/2020 12:29 PM   Attestation of Attending Supervision of Advanced Practice Provider (PA/CNM/NP): Evaluation and management procedures were performed by the Advanced Practice Provider under my supervision and collaboration.  I have reviewed the Advanced Practice Provider's note and chart, and I agree with the management and plan. I have also made any necessary editorial changes.   Sharon Seller, DO Attending Obstetrician & Gynecologist, [facility], [facility] 04/01/2020 2:13 PM

## 2020-04-01 NOTE — Discharge Instructions (Signed)
Back Pain in Pregnancy Back pain during pregnancy is common. Back pain may be caused by several factors that are related to changes during your pregnancy. Follow these instructions at home: Managing pain, stiffness, and swelling  If directed, for sudden (acute) back pain, put ice on the painful area. ? Put ice in a plastic bag. ? Place a towel between your skin and the bag. ? Leave the ice on for 20 minutes, 2-3 times per day.  If directed, apply heat to the affected area before you exercise. Use the heat source that your health care provider recommends, such as a moist heat pack or a heating pad. ? Place a towel between your skin and the heat source. ? Leave the heat on for 20-30 minutes. ? Remove the heat if your skin turns bright red. This is especially important if you are unable to feel pain, heat, or cold. You may have a greater risk of getting burned.  If directed, massage the affected area.      Activity  Exercise as told by your health care provider. Gentle exercise is the best way to prevent or manage back pain.  Listen to your body when lifting. If lifting hurts, ask for help or bend your knees. This uses your leg muscles instead of your back muscles.  Squat down when picking up something from the floor. Do not bend over.  Only use bed rest for short periods as told by your health care provider. Bed rest should only be used for the most severe episodes of back pain. Standing, sitting, and lying down  Do not stand in one place for long periods of time.  Use good posture when sitting. Make sure your head rests over your shoulders and is not hanging forward. Use a pillow on your lower back if necessary.  Try sleeping on your side, preferably the left side, with a pregnancy support pillow or 1-2 regular pillows between your legs. ? If you have back pain after a night's rest, your bed may be too soft. ? A firm mattress may provide more support for your back during  pregnancy. General instructions  Do not wear high heels.  Eat a healthy diet. Try to gain weight within your health care provider's recommendations.  Use a maternity girdle, elastic sling, or back brace as told by your health care provider.  Take over-the-counter and prescription medicines only as told by your health care provider.  Work with a physical therapist or massage therapist to find ways to manage back pain. Acupuncture or massage therapy may be helpful.  Keep all follow-up visits as told by your health care provider. This is important. Contact a health care provider if:  Your back pain interferes with your daily activities.  You have increasing pain in other parts of your body. Get help right away if:  You develop numbness, tingling, weakness, or problems with the use of your arms or legs.  You develop severe back pain that is not controlled with medicine.  You have a change in bowel or bladder control.  You develop shortness of breath, dizziness, or you faint.  You develop nausea, vomiting, or sweating.  You have back pain that is a rhythmic, cramping pain similar to labor pains. Labor pain is usually 1-2 minutes apart, lasts for about 1 minute, and involves a bearing down feeling or pressure in your pelvis.  You have back pain and your water breaks or you have vaginal bleeding.  You have back pain or   numbness that travels down your leg.  Your back pain developed after you fell.  You develop pain on one side of your back.  You see blood in your urine.  You develop skin blisters in the area of your back pain. Summary  Back pain may be caused by several factors that are related to changes during your pregnancy.  Follow instructions as told by your health care provider for managing pain, stiffness, and swelling.  Exercise as told by your health care provider. Gentle exercise is the best way to prevent or manage back pain.  Take over-the-counter and  prescription medicines only as told by your health care provider.  Keep all follow-up visits as told by your health care provider. This is important. This information is not intended to replace advice given to you by your health care provider. Make sure you discuss any questions you have with your health care provider. Document Revised: 05/06/2018 Document Reviewed: 07/03/2017 Elsevier Patient Education  2021 [employer]vier Inc.  

## 2020-04-01 NOTE — ED Triage Notes (Signed)
Pt here with reports of stepping off a porch and hearing her back pop. Pt states since then she has not been able to stand up straight. [facility].

## 2020-04-01 NOTE — MAU Note (Signed)
Presents with c/o sharp lower back pain.  States she stepped off a step last night and heard her back pop, hasn't been able to stand up straight since.  Reports attempted warm compresses and bath, Flexeril, and OTC TENS unit, no relief.  Denies VB or LOF.  Reports no FM since yesterday.

## 2020-04-04 ENCOUNTER — Inpatient Hospital Stay (HOSPITAL_COMMUNITY): Payer: Managed Care, Other (non HMO)

## 2020-04-04 ENCOUNTER — Ambulatory Visit
Admission: EM | Admit: 2020-04-04 | Discharge: 2020-04-04 | Disposition: A | Discharge status: Home / Self Care | Payer: Managed Care, Other (non HMO) | Source: Home / Self Care

## 2020-04-04 ENCOUNTER — Other Ambulatory Visit: Payer: Self-pay

## 2020-04-04 ENCOUNTER — Inpatient Hospital Stay (HOSPITAL_COMMUNITY)
Admission: AD | Admit: 2020-04-04 | Discharge: 2020-04-04 | Disposition: A | Discharge status: Home / Self Care | Payer: Managed Care, Other (non HMO) | Attending: Obstetrics & Gynecology | Admitting: Obstetrics & Gynecology

## 2020-04-04 DIAGNOSIS — N202 Calculus of kidney with calculus of ureter: Secondary | ICD-10-CM

## 2020-04-04 DIAGNOSIS — Z87891 Personal history of nicotine dependence: Secondary | ICD-10-CM | POA: Diagnosis not present

## 2020-04-04 DIAGNOSIS — O26899 Other specified pregnancy related conditions, unspecified trimester: Secondary | ICD-10-CM

## 2020-04-04 DIAGNOSIS — Z3A20 20 weeks gestation of pregnancy: Secondary | ICD-10-CM

## 2020-04-04 DIAGNOSIS — N200 Calculus of kidney: Secondary | ICD-10-CM | POA: Diagnosis not present

## 2020-04-04 DIAGNOSIS — R109 Unspecified abdominal pain: Secondary | ICD-10-CM

## 2020-04-04 DIAGNOSIS — Z3A19 19 weeks gestation of pregnancy: Secondary | ICD-10-CM | POA: Diagnosis not present

## 2020-04-04 DIAGNOSIS — O26833 Pregnancy related renal disease, third trimester: Secondary | ICD-10-CM

## 2020-04-04 DIAGNOSIS — O26892 Other specified pregnancy related conditions, second trimester: Secondary | ICD-10-CM | POA: Insufficient documentation

## 2020-04-04 DIAGNOSIS — Z880 Allergy status to penicillin: Secondary | ICD-10-CM | POA: Diagnosis not present

## 2020-04-04 DIAGNOSIS — N289 Disorder of kidney and ureter, unspecified: Secondary | ICD-10-CM

## 2020-04-04 DIAGNOSIS — R102 Pelvic and perineal pain: Secondary | ICD-10-CM

## 2020-04-04 LAB — COMPREHENSIVE METABOLIC PANEL
ALT: 10 U/L (ref 0–44)
AST: 14 U/L — ABNORMAL LOW (ref 15–41)
Albumin: 3.1 g/dL — ABNORMAL LOW (ref 3.5–5.0)
Alkaline Phosphatase: 65 U/L (ref 38–126)
Anion gap: 9 (ref 5–15)
BUN: 8 mg/dL (ref 6–20)
CO2: 20 mmol/L — ABNORMAL LOW (ref 22–32)
Calcium: 9.3 mg/dL (ref 8.9–10.3)
Chloride: 106 mmol/L (ref 98–111)
Creatinine, Ser: 0.57 mg/dL (ref 0.44–1.00)
GFR, Estimated: >60 mL/min (ref >60)
Glucose, Bld: 87 mg/dL (ref 70–99)
Potassium: 3.8 mmol/L (ref 3.5–5.1)
Sodium: 135 mmol/L (ref 135–145)
Total Bilirubin: 0.4 mg/dL (ref 0.3–1.2)
Total Protein: 6.1 g/dL — ABNORMAL LOW (ref 6.5–8.1)

## 2020-04-04 LAB — POCT URINALYSIS DIP (MANUAL ENTRY)
Bilirubin, UA: NEGATIVE
Glucose, UA: NEGATIVE mg/dL
Ketones, POC UA: NEGATIVE mg/dL
Leukocytes, UA: NEGATIVE
Nitrite, UA: NEGATIVE
Protein Ur, POC: 30 mg/dL — AB
Spec Grav, UA: 1.025 (ref 1.010–1.025)
Urobilinogen, UA: 0.2 E.U./dL
pH, UA: 7 (ref 5.0–8.0)

## 2020-04-04 MED ORDER — OXYCODONE HCL 5 MG PO CAPS: 5.0000 mg | ORAL_CAPSULE | ORAL | 0 refills | Status: DC | PRN

## 2020-04-04 NOTE — ED Triage Notes (Addendum)
Pt presents with complaints of left sided flank pain. Patient did step off of a step wrong and strained her back. She has a history of kidney stones and is concerned for that. She is [redacted] weeks pregnant. Reports she went to [facility] on Friday for concerns of kidney stones and they said her pain was from her back strain. Reports she is now having pelvic pain consistently and is concerned for a uti. Patient did urinate in office and had a kidney stone present. Denies any additional abdominal pain or vaginal bleeding. Reports pelvis pain has improved since passing kidney stone.

## 2020-04-04 NOTE — MAU Note (Signed)
Pt sent from Urgent Care secondary has both blood and protein in urine.  Pt presents with stone in urine specimen cup, states passed stone this morning.

## 2020-04-04 NOTE — ED Notes (Signed)
Patient is being discharged from the Urgent Care and sent to the Emergency Department via POV. Per Jerrilyn Cairo, patient is in need of higher level of care due to being [redacted] weeks pregnant with pelvic pain and blood and protein present in her urine. Patient is aware and verbalizes understanding of plan of care.  Vitals:   04/04/20 0816  BP: 117/76  Pulse: (!) 111  Resp: 20  Temp: 97.7 F (36.5 C)  SpO2: 98%

## 2020-04-04 NOTE — MAU Provider Note (Signed)
History     CSN: [id number]  Arrival date and time: 04/04/20 6789   Event Date/Time   First Provider Initiated Contact with Patient 04/04/20 [phone]        Chief Complaint  Patient presents with  . Hematuria   HPI This is a 30yo G2P0101 at 19 weeks 5 days. Pregnancy complicated by h/o preterm labor, h/o kidney stones, asthma, ankylosing spondylitis. She was seen in the MAU 3 days ago for back pain after "stepping wrong", diagnosed with a lumbar strain and sent home. Her pain increased over the weekend. Pain is in her left flank and radiates down into her pelvis. She went to urgent care this morning, had hematuria, and passed a kidney stone. She was directed here for further evaluations. Pain still 6-7/10. No other provoking or palliating factors.  OB History    Gravida  2   Para  1   Term  0   Preterm  1   AB  0   Living  1     SAB  0   IAB  0   Ectopic  0   Multiple  0   Live Births  1           Past Medical History:  Diagnosis Date  . Ankylosing spondylitis (HCC)   . Asthma   . Fibromyalgia   . Kidney stones   . Migraine   . Ovarian cyst   . Preterm labor     Past Surgical History:  Procedure Laterality Date  . COLONOSCOPY    . ESOPHAGOGASTRODUODENOSCOPY ENDOSCOPY    . spinal tap    . WISDOM TOOTH EXTRACTION    . WISDOM TOOTH EXTRACTION  2010    Family History  Problem Relation Age of Onset  . Arthritis Mother   . Hypertension Maternal Grandmother   . Thyroid disease Maternal Grandmother   . Kidney Stones Maternal Grandfather     Social History   Tobacco Use  . Smoking status: Former Smoker    Quit date: 2014    Years since quitting: 8.1  . Smokeless tobacco: Never Used  Vaping Use  . Vaping Use: Never used  Substance Use Topics  . Alcohol use: Not Currently    Alcohol/week: 0.0 standard drinks  . Drug use: No    Allergies:  Allergies  Allergen Reactions  . Amoxicillin Hives, Shortness Of Breath and Swelling    Swelling of  hands, face, and lip Has patient had a PCN reaction causing immediate rash, facial/tongue/throat swelling, SOB or lightheadedness with hypotension: Unknown Has patient had a PCN reaction causing severe rash involving mucus membranes or skin necrosis: Yes Has patient had a PCN reaction that required hospitalization: No Has patient had a PCN reaction occurring within the last 10 years: No If all of the above answers are "NO", then may proceed with Cephalosporin use.   Marland Kitchen Zofran [Ondansetron Hcl] Nausea Only    Medications Prior to Admission  Medication Sig Dispense Refill Last Dose  . acetaminophen (TYLENOL) 500 MG tablet Take 500 mg by mouth every 6 (six) hours as needed for moderate pain.      Marland Kitchen albuterol (PROVENTIL HFA;VENTOLIN HFA) 108 (90 BASE) MCG/ACT inhaler Inhale 2 puffs into the lungs every 6 (six) hours as needed for wheezing or shortness of breath.     . butalbital-acetaminophen-caffeine (FIORICET) 50-325-40 MG tablet Take 1-2 tablets by mouth every 6 (six) hours as needed for headache. 20 tablet 0   . cyclobenzaprine (FLEXERIL) 10 MG  tablet Take 1 tablet (10 mg total) by mouth every 8 (eight) hours as needed for muscle spasms. 30 tablet 1   . lidocaine (LIDODERM) 5 % Place 1 patch onto the skin daily. Remove & Discard patch within 12 hours or as directed by MD 30 patch 0   . mometasone (NASONEX) 50 MCG/ACT nasal spray Place 2 sprays into the nose 2 (two) times daily.      Marland Kitchen oxycodone (OXY-IR) 5 MG capsule Take 1 capsule (5 mg total) by mouth every 4 (four) hours as needed. 5 capsule 0   . Prenatal Vit-Fe Fumarate-FA (PRENATAL MULTIVITAMIN) TABS tablet Take 1 tablet by mouth daily at 12 noon.       Review of Systems  Constitutional: Negative for chills and fever.  Respiratory: Negative for chest tightness and shortness of breath.   Cardiovascular: Negative for chest pain.  All other systems reviewed and are negative. Denies contractions. Good fetal movement.   Physical Exam    Blood pressure 115/74, pulse (!) 109, temperature 98.4 F (36.9 C), temperature source Oral, resp. rate 20, last menstrual period 11/18/2019, SpO2 100 %, currently breastfeeding.  Physical Exam Vitals and nursing note reviewed.  Constitutional:      Appearance: Normal appearance.  HENT:     Head: Normocephalic and atraumatic.  Cardiovascular:     Rate and Rhythm: Normal rate and regular rhythm.     Pulses: Normal pulses.     Heart sounds: Normal heart sounds.  Pulmonary:     Effort: Pulmonary effort is normal. No respiratory distress.     Breath sounds: Normal breath sounds. No stridor. No wheezing or rhonchi.  Abdominal:     General: Abdomen is flat.     Palpations: Abdomen is soft.  Skin:    General: Skin is warm and dry.     Capillary Refill: Capillary refill takes less than 2 seconds.  Neurological:     General: No focal deficit present.     Mental Status: She is alert.  Psychiatric:        Mood and Affect: Mood normal.        Behavior: Behavior normal.        Thought Content: Thought content normal.        Judgment: Judgment normal.    Results for orders placed or performed during the hospital encounter of 04/04/20 (from the past 24 hour(s))  POCT urinalysis dipstick     Status: Abnormal   Collection Time: 04/04/20  8:24 AM  Result Value Ref Range   Color, UA yellow yellow   Clarity, UA cloudy (A) clear   Glucose, UA negative negative mg/dL   Bilirubin, UA negative negative   Ketones, POC UA negative negative mg/dL   Spec Grav, UA 9.622 2.979 - 1.025   Blood, UA large (A) negative   pH, UA 7.0 5.0 - 8.0   Protein Ur, POC =30 (A) negative mg/dL   Urobilinogen, UA 0.2 0.2 or 1.0 E.U./dL   Nitrite, UA Negative Negative   Leukocytes, UA Negative Negative   US RENAL  Result Date: 04/04/2020 CLINICAL DATA:  Hematuria, kidney stone EXAM: RENAL / URINARY TRACT ULTRASOUND COMPLETE COMPARISON:  None. FINDINGS: Right Kidney: Renal measurements: 12.4 x 5.9 x 5.5 cm =  volume: 209.2 mL. Echogenicity within normal limits. No mass or hydronephrosis visualized. Left Kidney: Renal measurements: 13 x 5.5 x 4.4 cm = volume: 162.2 mL. Echogenicity within normal limits. Small 1.5 cm area measured by technologist in the interpolar region demonstrates  similar echogenicity to adjacent parenchyma and probably reflects a pseudolesion. Mild hydronephrosis. Bladder: Appears normal for degree of bladder distention. No significant postvoid residual. Other: None. IMPRESSION: Mild left hydronephrosis. Small area measured by technologist in the interpolar region of left kidney probably reflects a pseudolesion. Follow-up ultrasound post pregnancy suggested. Electronically Signed   By: Guadlupe Spanish M.D.   On: 04/04/2020 10:56     MAU Course  Procedures  MDM   Assessment and Plan  1. Kidney stone complicating pregnancy - US RENAL; Standing - US RENAL  2. [redacted] weeks gestation of pregnancy  3. Pregnancy with nephrolithiasis in second trimester  4. Kidney lesion, native, left  Pain treatment - oxycodone #10 sent to pharmacy. No nephrolithiasis on [place]. Pseuodolesion on left kidney - likely benign, but will need further imaging after pregnancy to monitor   Levie Heritage 04/04/2020, 9:55 AM

## 2020-04-04 NOTE — Discharge Instructions (Signed)

## 2020-04-04 NOTE — ED Provider Notes (Signed)
[name]    CSN: [id number] Arrival date & time: 04/04/20  0805      History   Chief Complaint Chief Complaint  Patient presents with  . Flank Pain    HPI Angelica Kim is a 30 y.o. female.   HPI  Patient presents today with a concern of pelvic pain and left flank pain.  She is concerned she may have a UTI however upon providing a urine sample patient passed a renal stone.  Patient has a history of recurrent renal stones and reports that she is established that [facility] in [place].  Patient was seen at the MAU 4 days ago and treated for back pain which she reports never resolved.  Upon awakening this morning she had sharp left flank pain that radiated into her pelvis.  She endorses pain with urination.  No visible blood in urine.  However UA findings following a urine sample provided here in clinic today shows a large amount of blood and protein present.  Patient is afebrile currently without distress.  Endorses sensation of fetal movement this morning.   Past Medical History:  Diagnosis Date  . Ankylosing spondylitis (HCC)   . Asthma   . Fibromyalgia   . Kidney stones   . Migraine   . Ovarian cyst   . Preterm labor     Patient Active Problem List   Diagnosis Date Noted  . SVD (spontaneous vaginal delivery) 08/01/2019  . Postpartum care following vaginal delivery 7/2 08/01/2019  . Perineal laceration, second degree 08/01/2019  . Preeclampsia, severe 07/30/2019  . Generalized anxiety disorder 04/07/2014  . Migraines 12/21/2013  . Low back pain 12/21/2013  . Cognitive changes 12/21/2013    Past Surgical History:  Procedure Laterality Date  . COLONOSCOPY    . ESOPHAGOGASTRODUODENOSCOPY ENDOSCOPY    . spinal tap    . WISDOM TOOTH EXTRACTION    . WISDOM TOOTH EXTRACTION  2010    OB History    Gravida  2   Para  1   Term  0   Preterm  1   AB  0   Living  1     SAB  0   IAB  0   Ectopic  0   Multiple  0   Live Births  1             Home Medications    Prior to Admission medications   Medication Sig Start Date End Date Taking? Authorizing Provider  acetaminophen (TYLENOL) 500 MG tablet Take 500 mg by mouth every 6 (six) hours as needed for moderate pain.     [provider]  albuterol (PROVENTIL HFA;VENTOLIN HFA) 108 (90 BASE) MCG/ACT inhaler Inhale 2 puffs into the lungs every 6 (six) hours as needed for wheezing or shortness of breath.    [provider]  butalbital-acetaminophen-caffeine (FIORICET) 50-325-40 MG tablet Take 1-2 tablets by mouth every 6 (six) hours as needed for headache. 01/03/20 01/02/21  Gerrit Heck, CNM  cyclobenzaprine (FLEXERIL) 10 MG tablet Take 1 tablet (10 mg total) by mouth every 8 (eight) hours as needed for muscle spasms. 01/29/20   Edd Arbour R, CNM  lidocaine (LIDODERM) 5 % Place 1 patch onto the skin daily. Remove & Discard patch within 12 hours or as directed by MD 04/01/20   Haskel Schroeder, PA-C  mometasone (NASONEX) 50 MCG/ACT nasal spray Place 2 sprays into the nose 2 (two) times daily.     [provider]  oxycodone (  OXY-IR) 5 MG capsule Take 1 capsule (5 mg total) by mouth every 4 (four) hours as needed. 04/01/20   Bernerd Limbo, CNM  Prenatal Vit-Fe Fumarate-FA (PRENATAL MULTIVITAMIN) TABS tablet Take 1 tablet by mouth daily at 12 noon.    [provider]  metoCLOPramide (REGLAN) 10 MG tablet Take 1 tablet (10 mg total) by mouth every 6 (six) hours. 03/22/17 07/31/18  [name], Coral Else, PA-C    Family History Family History  Problem Relation Age of Onset  . Arthritis Mother   . Hypertension Maternal Grandmother   . Thyroid disease Maternal Grandmother   . Kidney Stones Maternal Grandfather     Social History Social History   Tobacco Use  . Smoking status: Former Smoker    Quit date: 2014    Years since quitting: 8.1  . Smokeless tobacco: Never Used  Vaping Use  . Vaping Use: Never used  Substance Use Topics  .  Alcohol use: Not Currently    Alcohol/week: 0.0 standard drinks  . Drug use: No     Allergies   Amoxicillin and Zofran [ondansetron hcl] Review of Systems Review of Systems Pertinent negatives listed in HPI  Physical Exam Triage Vital Signs ED Triage Vitals  Enc Vitals Group     BP 04/04/20 0816 117/76     Pulse Rate 04/04/20 0816 (!) 111     Resp 04/04/20 0816 20     Temp 04/04/20 0816 97.7 F (36.5 C)     Temp Source 04/04/20 0816 Oral     SpO2 04/04/20 0816 98 %     Weight --      Height --      Head Circumference --      Peak Flow --      Pain Score 04/04/20 0821 6     Pain Loc --      Pain Edu? --      Excl. in GC? --    No data found.  Updated Vital Signs BP 117/76 (BP Location: Left Arm)   Pulse (!) 111   Temp 97.7 F (36.5 C) (Oral)   Resp 20   LMP 11/18/2019 (Exact Date)   SpO2 98%   Visual Acuity Right Eye Distance:   Left Eye Distance:   Bilateral Distance:    Right Eye Near:   Left Eye Near:    Bilateral Near:     Physical Exam HENT:     Head: Normocephalic.  Cardiovascular:     Rate and Rhythm: Tachycardia present.  Abdominal:     Tenderness: There is left CVA tenderness.  Musculoskeletal:        General: Normal range of motion.  Skin:    General: Skin is warm.  Neurological:     General: No focal deficit present.     Mental Status: She is alert and oriented to person, place, and time.  Psychiatric:        Mood and Affect: Mood normal.        Behavior: Behavior normal.        Thought Content: Thought content normal.        Judgment: Judgment normal.     UC Treatments / Results  Labs (all labs ordered are listed, but only abnormal results are displayed) Labs Reviewed  POCT URINALYSIS DIP (MANUAL ENTRY) - Abnormal; Notable for the following components:      Result Value   Clarity, UA cloudy (*)    Blood, UA large (*)    Protein  Ur, POC =30 (*)    All other components within normal limits  URINE CULTURE     EKG   Radiology No results found.  Procedures Procedures (including critical care time)  Medications Ordered in UC Medications - No data to display  Initial Impression / Assessment and Plan / UC Course  I have reviewed the triage vital signs and the nursing notes.  Pertinent labs & imaging results that were available during my care of the patient were reviewed by me and considered in my medical decision making (see chart for details).    Patient presents today with concern for possible UTI.  Patient is [redacted] weeks pregnant and experiencing some mild pelvic pain and acute left flank pain.  UA revealed large blood and protein.  Patient also was able to pass a moderate size kidney stone while urinating.  Given patient's gestational weeks and UA findings patient has been directed to be evaluated in the MAU at [facility].  Patient is stable for self transport.  Vital signs are also stable.Patient verbalized understanding and agreement with plan. Final Clinical Impressions(s) / UC Diagnoses   Final diagnoses:  Pelvic pain in pregnant patient at greater than [redacted] weeks gestation  Acute left flank pain     Discharge Instructions     Go immediately to [facility] Assessment Unit at [facility] for further evaluation of hematuria present in urine, proteinuria, and  left flank pain.     ED Prescriptions    None     PDMP not reviewed this encounter.   Bing Neighbors, [place] 04/04/20 [phone]

## 2020-04-04 NOTE — Discharge Instructions (Addendum)
Go immediately to [facility] Assessment Unit at [facility] for further evaluation of hematuria present in urine, proteinuria, and  left flank pain.

## 2020-04-05 LAB — URINE CULTURE
Culture: NO GROWTH
Special Requests: NORMAL

## 2020-05-30 ENCOUNTER — Other Ambulatory Visit: Payer: Self-pay | Admitting: Obstetrics and Gynecology

## 2020-05-30 DIAGNOSIS — Z363 Encounter for antenatal screening for malformations: Secondary | ICD-10-CM

## 2020-06-04 ENCOUNTER — Other Ambulatory Visit: Payer: Self-pay

## 2020-06-04 ENCOUNTER — Encounter: Payer: Self-pay | Admitting: *Deleted

## 2020-06-04 ENCOUNTER — Ambulatory Visit
Admission: EM | Admit: 2020-06-04 | Discharge: 2020-06-04 | Disposition: A | Discharge status: Home / Self Care | Payer: Managed Care, Other (non HMO) | Attending: Family Medicine | Admitting: Family Medicine

## 2020-06-04 DIAGNOSIS — J069 Acute upper respiratory infection, unspecified: Secondary | ICD-10-CM | POA: Diagnosis not present

## 2020-06-04 DIAGNOSIS — Z20822 Contact with and (suspected) exposure to covid-19: Secondary | ICD-10-CM

## 2020-06-04 NOTE — ED Triage Notes (Signed)
C/O cough, runny nose, and chest burning x 3 days without fevers.

## 2020-06-06 ENCOUNTER — Ambulatory Visit: Payer: Managed Care, Other (non HMO) | Attending: Internal Medicine

## 2020-06-06 ENCOUNTER — Other Ambulatory Visit: Payer: Self-pay | Admitting: Obstetrics and Gynecology

## 2020-06-06 ENCOUNTER — Other Ambulatory Visit: Payer: Self-pay | Admitting: *Deleted

## 2020-06-06 ENCOUNTER — Other Ambulatory Visit: Payer: Self-pay

## 2020-06-06 DIAGNOSIS — O09299 Supervision of pregnancy with other poor reproductive or obstetric history, unspecified trimester: Secondary | ICD-10-CM

## 2020-06-06 DIAGNOSIS — O09293 Supervision of pregnancy with other poor reproductive or obstetric history, third trimester: Secondary | ICD-10-CM | POA: Diagnosis not present

## 2020-06-06 DIAGNOSIS — Z363 Encounter for antenatal screening for malformations: Secondary | ICD-10-CM | POA: Insufficient documentation

## 2020-06-06 DIAGNOSIS — Z3689 Encounter for other specified antenatal screening: Secondary | ICD-10-CM

## 2020-06-06 DIAGNOSIS — Z3A28 28 weeks gestation of pregnancy: Secondary | ICD-10-CM | POA: Insufficient documentation

## 2020-06-06 NOTE — ED Provider Notes (Signed)
[facility]   [id number] 06/04/20 Arrival Time: 1412  ASSESSMENT & PLAN:  1. Viral URI with cough   2. Encounter for laboratory testing for COVID-19 virus     OTC symptom care as needed.    Follow-up Information    Benita Stabile, MD.   Specialty: Internal Medicine Why: As needed. Contact information: [address] [place] [address] [phone]        [facility] .   Specialty: Urgent Care Why: If worsening or failing to improve as anticipated. Contact information: [id number] mc [place] [address] [phone]              Reviewed expectations re: course of current medical issues. Questions answered. Outlined signs and symptoms indicating need for more acute intervention. Understanding verbalized. After Visit Summary given.   SUBJECTIVE: History from: patient. Angelica Kim is a 30 y.o. female who reports cough, runny nose, "burning feeling" in chest; abrupt onset 2-3 d ago. Denies: difficulty breathing. Normal PO intake without n/v/d.  OBJECTIVE:  Vitals:   06/04/20 1442  BP: 119/64  Pulse: 99  Resp: 18  Temp: 98.1 F (36.7 C)  TempSrc: Temporal  SpO2: 97%    General appearance: alert; no distress Eyes: PERRLA; EOMI; conjunctiva normal HENT: [place]; AT; with nasal congestion Neck: supple  Lungs: speaks full sentences without difficulty; unlabored Extremities: no edema Skin: warm and dry Neurologic: normal gait Psychological: alert and cooperative; normal mood and affect  Labs:  Labs Reviewed  NOVEL CORONAVIRUS, NAA    Allergies  Allergen Reactions  . Amoxicillin Hives, Shortness Of Breath and Swelling    Swelling of hands, face, and lip Has patient had a PCN reaction causing immediate rash, facial/tongue/throat swelling, SOB or lightheadedness with hypotension: Unknown Has patient had a PCN reaction causing severe rash involving mucus membranes or  skin necrosis: Yes Has patient had a PCN reaction that required hospitalization: No Has patient had a PCN reaction occurring within the last 10 years: No If all of the above answers are "NO", then may proceed with Cephalosporin use.   Marland Kitchen Zofran [Ondansetron Hcl] Nausea Only    Past Medical History:  Diagnosis Date  . Ankylosing spondylitis (HCC)   . Asthma   . Fibromyalgia   . Kidney stones   . Migraine   . Ovarian cyst   . Preterm labor    Social History   Socioeconomic History  . Marital status: Single    Spouse name: Not on file  . Number of children: 0  . Years of education: Masters  . Highest education level: Not on file  Occupational History    Employer: [employer]  Tobacco Use  . Smoking status: Former Smoker    Quit date: 2014    Years since quitting: 8.3  . Smokeless tobacco: Never Used  Vaping Use  . Vaping Use: Never used  Substance and Sexual Activity  . Alcohol use: Not Currently  . Drug use: No  . Sexual activity: Yes    Birth control/protection: None    Comment: pregnant  Other Topics Concern  . Not on file  Social History Narrative   Patient drinks caffeine moderate    Patient is single   Patient lives alone    Patient has no children    Patient has a Master's degree    Caffeine: soda (3 12oz per day)   Social Determinants of Corporate investment banker  Strain: Not on file  Food Insecurity: Not on file  Transportation Needs: Not on file  Physical Activity: Not on file  Stress: Not on file  Social Connections: Not on file  Intimate Partner Violence: Not on file   Family History  Problem Relation Age of Onset  . Arthritis Mother   . Hypertension Maternal Grandmother   . Thyroid disease Maternal Grandmother   . Kidney Stones Maternal Grandfather    Past Surgical History:  Procedure Laterality Date  . COLONOSCOPY    . ESOPHAGOGASTRODUODENOSCOPY ENDOSCOPY    . spinal tap    . WISDOM TOOTH EXTRACTION    . WISDOM TOOTH EXTRACTION   2010     Mardella Layman, MD 06/06/20 [phone]

## 2020-06-07 ENCOUNTER — Encounter (HOSPITAL_COMMUNITY): Payer: Self-pay | Admitting: Obstetrics & Gynecology

## 2020-06-07 ENCOUNTER — Inpatient Hospital Stay (HOSPITAL_COMMUNITY)
Admission: AD | Admit: 2020-06-07 | Discharge: 2020-06-07 | Disposition: A | Discharge status: Home / Self Care | Payer: Managed Care, Other (non HMO) | Attending: Obstetrics & Gynecology | Admitting: Obstetrics & Gynecology

## 2020-06-07 ENCOUNTER — Other Ambulatory Visit: Payer: Self-pay

## 2020-06-07 DIAGNOSIS — Z880 Allergy status to penicillin: Secondary | ICD-10-CM | POA: Insufficient documentation

## 2020-06-07 DIAGNOSIS — O212 Late vomiting of pregnancy: Secondary | ICD-10-CM | POA: Diagnosis present

## 2020-06-07 DIAGNOSIS — Z79899 Other long term (current) drug therapy: Secondary | ICD-10-CM | POA: Insufficient documentation

## 2020-06-07 DIAGNOSIS — O219 Vomiting of pregnancy, unspecified: Secondary | ICD-10-CM

## 2020-06-07 DIAGNOSIS — Z888 Allergy status to other drugs, medicaments and biological substances status: Secondary | ICD-10-CM | POA: Insufficient documentation

## 2020-06-07 DIAGNOSIS — O99891 Other specified diseases and conditions complicating pregnancy: Secondary | ICD-10-CM | POA: Insufficient documentation

## 2020-06-07 DIAGNOSIS — Z87891 Personal history of nicotine dependence: Secondary | ICD-10-CM | POA: Diagnosis not present

## 2020-06-07 DIAGNOSIS — Z7951 Long term (current) use of inhaled steroids: Secondary | ICD-10-CM | POA: Diagnosis not present

## 2020-06-07 DIAGNOSIS — Z3A28 28 weeks gestation of pregnancy: Secondary | ICD-10-CM | POA: Diagnosis not present

## 2020-06-07 DIAGNOSIS — R197 Diarrhea, unspecified: Secondary | ICD-10-CM | POA: Diagnosis not present

## 2020-06-07 LAB — URINALYSIS, ROUTINE W REFLEX MICROSCOPIC
Bilirubin Urine: NEGATIVE
Glucose, UA: NEGATIVE mg/dL
Hgb urine dipstick: NEGATIVE
Ketones, ur: 20 mg/dL — AB
Leukocytes,Ua: NEGATIVE
Nitrite: NEGATIVE
Protein, ur: 30 mg/dL — AB
Specific Gravity, Urine: 1.019 (ref 1.005–1.030)
pH: 6 (ref 5.0–8.0)

## 2020-06-07 LAB — CBC
HCT: 34.8 % — ABNORMAL LOW (ref 36.0–46.0)
Hemoglobin: 11.8 g/dL — ABNORMAL LOW (ref 12.0–15.0)
MCH: 30.7 pg (ref 26.0–34.0)
MCHC: 33.9 g/dL (ref 30.0–36.0)
MCV: 90.6 fL (ref 80.0–100.0)
Platelets: 168 10*3/uL (ref 150–400)
RBC: 3.84 MIL/uL — ABNORMAL LOW (ref 3.87–5.11)
RDW: 13 % (ref 11.5–15.5)
WBC: 14.6 10*3/uL — ABNORMAL HIGH (ref 4.0–10.5)
nRBC: 0 % (ref 0.0–0.2)

## 2020-06-07 LAB — COMPREHENSIVE METABOLIC PANEL
ALT: 10 U/L (ref 0–44)
AST: 21 U/L (ref 15–41)
Albumin: 2.9 g/dL — ABNORMAL LOW (ref 3.5–5.0)
Alkaline Phosphatase: 94 U/L (ref 38–126)
Anion gap: 9 (ref 5–15)
BUN: 6 mg/dL (ref 6–20)
CO2: 19 mmol/L — ABNORMAL LOW (ref 22–32)
Calcium: 8.3 mg/dL — ABNORMAL LOW (ref 8.9–10.3)
Chloride: 106 mmol/L (ref 98–111)
Creatinine, Ser: 0.63 mg/dL (ref 0.44–1.00)
GFR, Estimated: >60 mL/min (ref >60)
Glucose, Bld: 78 mg/dL (ref 70–99)
Potassium: 3.7 mmol/L (ref 3.5–5.1)
Sodium: 134 mmol/L — ABNORMAL LOW (ref 135–145)
Total Bilirubin: 1.1 mg/dL (ref 0.3–1.2)
Total Protein: 5.9 g/dL — ABNORMAL LOW (ref 6.5–8.1)

## 2020-06-07 MED ORDER — SODIUM CHLORIDE 0.9 % IV SOLN
25.0000 mg | Freq: Once | INTRAVENOUS | Status: AC
  Administered 2020-06-07: 25 mg via INTRAVENOUS
  Filled 2020-06-07: qty 1

## 2020-06-07 MED ORDER — PROMETHAZINE HCL 25 MG PO TABS: 25.0000 mg | ORAL_TABLET | Freq: Four times a day (QID) | ORAL | 0 refills | Status: DC | PRN

## 2020-06-07 NOTE — MAU Note (Signed)
Presents with c/o N/V/D, reports can't keep anything down including fluids since 0500 this morning. Denies VB or LOF.  Endorses +FM.

## 2020-06-07 NOTE — Discharge Instructions (Signed)
Bland Diet A bland diet consists of foods that are often soft and do not have a lot of fat, fiber, or extra seasonings. Foods without fat, fiber, or seasoning are easier for the body to digest. They are also less likely to irritate your mouth, throat, stomach, and other parts of your digestive system. A bland diet is sometimes called a BRAT diet. What is my plan? Your health care provider or food and nutrition specialist (dietitian) may recommend specific changes to your diet to prevent symptoms or to treat your symptoms. These changes may include:  Eating small meals often.  Cooking food until it is soft enough to chew easily.  Chewing your food well.  Drinking fluids slowly.  Not eating foods that are very spicy, sour, or fatty.  Not eating citrus fruits, such as oranges and grapefruit. What do I need to know about this diet?  Eat a variety of foods from the bland diet food list.  Do not follow a bland diet longer than needed.  Ask your health care provider whether you should take vitamins or supplements. What foods can I eat? Grains Hot cereals, such as cream of wheat. Rice. Bread, crackers, or tortillas made from refined white flour.   Vegetables Canned or cooked vegetables. Mashed or boiled potatoes. Fruits Bananas. Applesauce. Other types of cooked or canned fruit with the skin and seeds removed, such as canned peaches or pears.   Meats and other proteins Scrambled eggs. Creamy peanut butter or other nut butters. Lean, well-cooked meats, such as chicken or fish. Tofu. Soups or broths.   Dairy Low-fat dairy products, such as milk, cottage cheese, or yogurt. Beverages Water. Herbal tea. Apple juice.   Fats and oils Mild salad dressings. Canola or olive oil. Sweets and desserts Pudding. Custard. Fruit gelatin. Ice cream. The items listed above may not be a complete list of recommended foods and beverages. Contact a dietitian for more options. What foods are not  recommended? Grains Whole grain breads and cereals. Vegetables Raw vegetables. Fruits Raw fruits, especially citrus, berries, or dried fruits. Dairy Whole fat dairy foods. Beverages Caffeinated drinks. Alcohol. Seasonings and condiments Strongly flavored seasonings or condiments. Hot sauce. Salsa. Other foods Spicy foods. Fried foods. Sour foods, such as pickled or fermented foods. Foods with high sugar content. Foods high in fiber. The items listed above may not be a complete list of foods and beverages to avoid. Contact a dietitian for more information. Summary  A bland diet consists of foods that are often soft and do not have a lot of fat, fiber, or extra seasonings.  Foods without fat, fiber, or seasoning are easier for the body to digest.  Check with your health care provider to see how long you should follow this diet plan. It is not meant to be followed for long periods. This information is not intended to replace advice given to you by your health care provider. Make sure you discuss any questions you have with your health care provider. Document Revised: 02/13/2017 Document Reviewed: 02/13/2017 Elsevier Patient Education  2021 Elsevier Inc. Nausea and Vomiting, Adult Nausea is the feeling that you have an upset stomach or that you are about to vomit. Vomiting is when stomach contents are thrown up and out of the mouth as a result of nausea. Vomiting can make you feel weak and cause you to become dehydrated. Dehydration can make you feel tired and thirsty, cause you to have a dry mouth, and decrease how often you urinate. Older  adults and people with other diseases or a weak disease-fighting system (immune system) are at higher risk for dehydration. It is important to treat your nausea and vomiting as told by your health care provider. Follow these instructions at home: Watch your symptoms for any changes. Tell your health care provider about them. Follow these instructions to  care for yourself at home. Eating and drinking  Take an oral rehydration solution (ORS). This is a drink that is sold at pharmacies and retail stores.  Drink clear fluids slowly and in small amounts as you are able. Clear fluids include water, ice chips, low-calorie sports drinks, and fruit juice that has water added (diluted fruit juice).  Eat bland, easy-to-digest foods in small amounts as you are able. These foods include bananas, applesauce, rice, lean meats, toast, and crackers.  Avoid fluids that contain a lot of sugar or caffeine, such as energy drinks, sports drinks, and soda.  Avoid alcohol.  Avoid spicy or fatty foods.      General instructions  Take over-the-counter and prescription medicines only as told by your health care provider.  Drink enough fluid to keep your urine pale yellow.  Wash your hands often using soap and water. If soap and water are not available, use hand sanitizer.  Make sure that all people in your household wash their hands well and often.  Rest at home while you recover.  Watch your condition for any changes.  Breathe slowly and deeply when you feel nauseated.  Keep all follow-up visits as told by your health care provider. This is important. Contact a health care provider if:  Your symptoms get worse.  You have new symptoms.  You have a fever.  You cannot drink fluids without vomiting.  Your nausea does not go away after 2 days.  You feel light-headed or dizzy.  You have a headache.  You have muscle cramps.  You have a rash.  You have pain while urinating. Get help right away if:  You have pain in your chest, neck, arm, or jaw.  You feel extremely weak or you faint.  You have persistent vomiting.  You have vomit that is bright red or looks like black coffee grounds.  You have bloody or black stools or stools that look like tar.  You have a severe headache, a stiff neck, or both.  You have severe pain, cramping, or  bloating in your abdomen.  You have difficulty breathing, or you are breathing very quickly.  Your heart is beating very quickly.  Your skin feels cold and clammy.  You feel confused.  You have signs of dehydration, such as: ? Dark urine, very little urine, or no urine. ? Cracked lips. ? Dry mouth. ? Sunken eyes. ? Sleepiness. ? Weakness. These symptoms may represent a serious problem that is an emergency. Do not wait to see if the symptoms will go away. Get medical help right away. Call your local emergency services (911 in the [place] Do not drive yourself to the hospital. Summary  Nausea is the feeling that you have an upset stomach or that you are about to vomit. As nausea gets worse, it can lead to vomiting. Vomiting can make you feel weak and cause you to become dehydrated.  Follow instructions from your health care provider about eating and drinking to prevent dehydration.  Take over-the-counter and prescription medicines only as told by your health care provider.  Contact your health care provider if your symptoms get worse, or you have  new symptoms.  Keep all follow-up visits as told by your health care provider. This is important. This information is not intended to replace advice given to you by your health care provider. Make sure you discuss any questions you have with your health care provider. Document Revised: 05/09/2018 Document Reviewed: 06/25/2017 Elsevier Patient Education  2021 [employer].

## 2020-06-07 NOTE — MAU Provider Note (Signed)
Chief Complaint:  Emesis, Nausea, and Diarrhea   Event Date/Time   First Provider Initiated Contact with Patient 06/07/20 1844     HPI: Angelica Kim is a 30 y.o. G2P0101 at [redacted]w[redacted]d who presents to maternity admissions reporting nausea, vomiting, and diarrhea. Patient reports she has had watery diarrhea on and off for the last month with multiple episodes/day. Started taking imodium this past weekend which has not helped. This morning she started having vomiting. Has tried drinking Pedialyte, Gatorade, sprite, water however has been unsuccessful and keeping fluids down. Reports approximately 8-9 episodes of vomiting today. She does not have any nausea medications at home. She denies fever, chills, urinary symptoms. Her daughter was recently sick with a respiratory infection. Patient had a Covid test 3 days ago which is still pending. She denies contractions, leakage of fluid or vaginal bleeding. Endorses good fetal movement.   Pregnancy Course:   Past Medical History:  Diagnosis Date  . Ankylosing spondylitis (HCC)   . Asthma   . Fibromyalgia   . Kidney stones   . Migraine   . Ovarian cyst   . Preterm labor    OB History  Gravida Para Term Preterm AB Living  2 1 0 1 0 1  SAB IAB Ectopic Multiple Live Births  0 0 0 0 1    # Outcome Date GA Lbr Len/2nd Weight Sex Delivery Anes PTL Lv  2 Current           1 Preterm 07/31/19 [redacted]w[redacted]d 09:24 / 01:01 2634 g F Vag-Spont EPI  LIV     Birth Comments: WNL   Past Surgical History:  Procedure Laterality Date  . COLONOSCOPY    . ESOPHAGOGASTRODUODENOSCOPY ENDOSCOPY    . spinal tap    . WISDOM TOOTH EXTRACTION    . WISDOM TOOTH EXTRACTION  2010   Family History  Problem Relation Age of Onset  . Arthritis Mother   . Hypertension Maternal Grandmother   . Thyroid disease Maternal Grandmother   . Kidney Stones Maternal Grandfather    Social History   Tobacco Use  . Smoking status: Former Smoker    Quit date: 2014    Years since quitting: 8.3   . Smokeless tobacco: Never Used  Vaping Use  . Vaping Use: Never used  Substance Use Topics  . Alcohol use: Not Currently  . Drug use: No   Allergies  Allergen Reactions  . Amoxicillin Hives, Shortness Of Breath and Swelling    Swelling of hands, face, and lip Has patient had a PCN reaction causing immediate rash, facial/tongue/throat swelling, SOB or lightheadedness with hypotension: Unknown Has patient had a PCN reaction causing severe rash involving mucus membranes or skin necrosis: Yes Has patient had a PCN reaction that required hospitalization: No Has patient had a PCN reaction occurring within the last 10 years: No If all of the above answers are "NO", then may proceed with Cephalosporin use.   Marland Kitchen Zofran [Ondansetron Hcl] Nausea Only   Medications Prior to Admission  Medication Sig Dispense Refill Last Dose  . albuterol (PROVENTIL HFA;VENTOLIN HFA) 108 (90 BASE) MCG/ACT inhaler Inhale 2 puffs into the lungs every 6 (six) hours as needed for wheezing or shortness of breath.   Past Month at Unknown time  . mometasone (NASONEX) 50 MCG/ACT nasal spray Place 2 sprays into the nose 2 (two) times daily.    Past Week at Unknown time  . Prenatal Vit-Fe Fumarate-FA (PRENATAL MULTIVITAMIN) TABS tablet Take 1 tablet by mouth daily at  12 noon.   Past Week at Unknown time    I have reviewed patient's Past Medical Hx, Surgical Hx, Family Hx, Social Hx, medications and allergies.   ROS:  Review of Systems  Constitutional: Negative.   HENT: Negative.   Respiratory: Negative.   Cardiovascular: Negative.   Gastrointestinal: Positive for diarrhea, nausea and vomiting. Negative for abdominal pain.  Genitourinary: Negative.   Musculoskeletal: Negative.   Neurological: Negative.   Psychiatric/Behavioral: Negative.     Physical Exam   Patient Vitals for the past 24 hrs:  BP Temp Temp src Pulse Resp SpO2 Height Weight  06/07/20 1810 125/76 98.7 F (37.1 C) Oral (!) 117 18 100 % -- --   06/07/20 1805 -- -- -- -- -- -- 5' (1.524 m) 74.9 kg   Constitutional: well-developed, well-nourished female in no acute distress.  Cardiovascular: normal rate Respiratory: normal effort GI: abd soft, non-tender, gravid MS: extremities nontender, no edema, normal ROM Neurologic: alert and oriented x 4.  GU: neg CVAT. Pelvic: deferred   FHT: Baseline 135, moderate variability, 10x10 accelerations present, no decelerations Toco: Quiet   Labs: Results for orders placed or performed during the hospital encounter of 06/07/20 (from the past 24 hour(s))  Urinalysis, Routine w reflex microscopic Urine, Clean Catch     Status: Abnormal   Collection Time: 06/07/20  6:19 PM  Result Value Ref Range   Color, Urine YELLOW YELLOW   APPearance HAZY (A) CLEAR   Specific Gravity, Urine 1.019 1.005 - 1.030   pH 6.0 5.0 - 8.0   Glucose, UA NEGATIVE NEGATIVE mg/dL   Hgb urine dipstick NEGATIVE NEGATIVE   Bilirubin Urine NEGATIVE NEGATIVE   Ketones, ur 20 (A) NEGATIVE mg/dL   Protein, ur 30 (A) NEGATIVE mg/dL   Nitrite NEGATIVE NEGATIVE   Leukocytes,Ua NEGATIVE NEGATIVE   RBC / HPF 0-5 0 - 5 RBC/hpf   WBC, UA 0-5 0 - 5 WBC/hpf   Bacteria, UA RARE (A) NONE SEEN   Squamous Epithelial / LPF 0-5 0 - 5   Mucus PRESENT    Hyaline Casts, UA PRESENT   CBC     Status: Abnormal   Collection Time: 06/07/20  6:54 PM  Result Value Ref Range   WBC 14.6 (H) 4.0 - 10.5 K/uL   RBC 3.84 (L) 3.87 - 5.11 MIL/uL   Hemoglobin 11.8 (L) 12.0 - 15.0 g/dL   HCT 11.9 (L) 14.7 - 82.9 %   MCV 90.6 80.0 - 100.0 fL   MCH 30.7 26.0 - 34.0 pg   MCHC 33.9 30.0 - 36.0 g/dL   RDW 56.2 13.0 - 86.5 %   Platelets 168 150 - 400 K/uL   nRBC 0.0 0.0 - 0.2 %  Comprehensive metabolic panel     Status: Abnormal   Collection Time: 06/07/20  6:54 PM  Result Value Ref Range   Sodium 134 (L) 135 - 145 mmol/L   Potassium 3.7 3.5 - 5.1 mmol/L   Chloride 106 98 - 111 mmol/L   CO2 19 (L) 22 - 32 mmol/L   Glucose, Bld 78 70 - 99  mg/dL   BUN 6 6 - 20 mg/dL   Creatinine, Ser 7.84 0.44 - 1.00 mg/dL   Calcium 8.3 (L) 8.9 - 10.3 mg/dL   Total Protein 5.9 (L) 6.5 - 8.1 g/dL   Albumin 2.9 (L) 3.5 - 5.0 g/dL   AST 21 15 - 41 U/L   ALT 10 0 - 44 U/L   Alkaline Phosphatase 94 38 -  126 U/L   Total Bilirubin 1.1 0.3 - 1.2 mg/dL   GFR, Estimated >48 >54 mL/min   Anion gap 9 5 - 15   Imaging:    MAU Course: Orders Placed This Encounter  Procedures  . OB Urine Culture  . Gastrointestinal Panel by PCR , Stool  . Urinalysis, Routine w reflex microscopic Urine, Clean Catch  . CBC  . Comprehensive metabolic panel  . Enteric precautions (UV disinfection) C difficile, Norovirus   Meds ordered this encounter  Medications  . promethazine (PHENERGAN) 25 mg in sodium chloride 0.9 % 1,000 mL infusion    MDM: UA, culture pending CBC, CMP Phenergan infusion IV, nausea improved, tolerating ice chips/ginger ale Stool sample ordered, pt to collect outpatient and bring back to MAU per main lab Recommend BRAT diet, phenergan and imodium as needed   Assessment: 1. Nausea and vomiting during pregnancy   2. [redacted] weeks gestation of pregnancy   3. Diarrhea, unspecified type     Plan: Discharge home in stable condition.  Preterm labor precautions and fetal kick counts reviewed Return precautions given Keep appointment as scheduled   Allergies as of 06/07/2020      Reactions   Amoxicillin Hives, Shortness Of Breath, Swelling   Swelling of hands, face, and lip Has patient had a PCN reaction causing immediate rash, facial/tongue/throat swelling, SOB or lightheadedness with hypotension: Unknown Has patient had a PCN reaction causing severe rash involving mucus membranes or skin necrosis: Yes Has patient had a PCN reaction that required hospitalization: No Has patient had a PCN reaction occurring within the last 10 years: No If all of the above answers are "NO", then may proceed with Cephalosporin use.   Zofran [ondansetron  Hcl] Nausea Only      Medication List    TAKE these medications   albuterol 108 (90 Base) MCG/ACT inhaler Commonly known as: VENTOLIN HFA Inhale 2 puffs into the lungs every 6 (six) hours as needed for wheezing or shortness of breath.   mometasone 50 MCG/ACT nasal spray Commonly known as: NASONEX Place 2 sprays into the nose 2 (two) times daily.   prenatal multivitamin Tabs tablet Take 1 tablet by mouth daily at 12 noon.   promethazine 25 MG tablet Commonly known as: PHENERGAN Take 1 tablet (25 mg total) by mouth every 6 (six) hours as needed for nausea or vomiting.       Camelia Eng, CNM 06/07/2020 9:16 PM

## 2020-06-08 LAB — GASTROINTESTINAL PANEL BY PCR, STOOL (REPLACES STOOL CULTURE)

## 2020-06-09 LAB — CULTURE, OB URINE

## 2020-06-21 ENCOUNTER — Other Ambulatory Visit: Payer: Self-pay

## 2020-06-21 ENCOUNTER — Inpatient Hospital Stay (HOSPITAL_COMMUNITY)
Admission: AD | Admit: 2020-06-21 | Discharge: 2020-06-21 | Disposition: A | Discharge status: Home / Self Care | Payer: Managed Care, Other (non HMO) | Attending: Obstetrics & Gynecology | Admitting: Obstetrics & Gynecology

## 2020-06-21 ENCOUNTER — Encounter (HOSPITAL_COMMUNITY): Payer: Self-pay | Admitting: Obstetrics & Gynecology

## 2020-06-21 DIAGNOSIS — G43909 Migraine, unspecified, not intractable, without status migrainosus: Secondary | ICD-10-CM | POA: Insufficient documentation

## 2020-06-21 DIAGNOSIS — Z87891 Personal history of nicotine dependence: Secondary | ICD-10-CM | POA: Insufficient documentation

## 2020-06-21 DIAGNOSIS — Z7951 Long term (current) use of inhaled steroids: Secondary | ICD-10-CM | POA: Diagnosis not present

## 2020-06-21 DIAGNOSIS — O99513 Diseases of the respiratory system complicating pregnancy, third trimester: Secondary | ICD-10-CM | POA: Diagnosis not present

## 2020-06-21 DIAGNOSIS — O26893 Other specified pregnancy related conditions, third trimester: Secondary | ICD-10-CM

## 2020-06-21 DIAGNOSIS — O99353 Diseases of the nervous system complicating pregnancy, third trimester: Secondary | ICD-10-CM | POA: Diagnosis present

## 2020-06-21 DIAGNOSIS — J45909 Unspecified asthma, uncomplicated: Secondary | ICD-10-CM | POA: Diagnosis not present

## 2020-06-21 DIAGNOSIS — Z7982 Long term (current) use of aspirin: Secondary | ICD-10-CM | POA: Diagnosis not present

## 2020-06-21 DIAGNOSIS — Z79899 Other long term (current) drug therapy: Secondary | ICD-10-CM | POA: Diagnosis not present

## 2020-06-21 DIAGNOSIS — Z3A30 30 weeks gestation of pregnancy: Secondary | ICD-10-CM | POA: Insufficient documentation

## 2020-06-21 DIAGNOSIS — A0811 Acute gastroenteropathy due to Norwalk agent: Secondary | ICD-10-CM | POA: Insufficient documentation

## 2020-06-21 LAB — URINALYSIS, ROUTINE W REFLEX MICROSCOPIC
Bilirubin Urine: NEGATIVE
Glucose, UA: NEGATIVE mg/dL
Hgb urine dipstick: NEGATIVE
Ketones, ur: NEGATIVE mg/dL
Leukocytes,Ua: NEGATIVE
Nitrite: NEGATIVE
Protein, ur: NEGATIVE mg/dL
Specific Gravity, Urine: 1.014 (ref 1.005–1.030)
pH: 7 (ref 5.0–8.0)

## 2020-06-21 LAB — COMPREHENSIVE METABOLIC PANEL
ALT: 9 U/L (ref 0–44)
AST: 13 U/L — ABNORMAL LOW (ref 15–41)
Albumin: 2.7 g/dL — ABNORMAL LOW (ref 3.5–5.0)
Alkaline Phosphatase: 83 U/L (ref 38–126)
Anion gap: 9 (ref 5–15)
BUN: <5 mg/dL — ABNORMAL LOW (ref 6–20)
CO2: 20 mmol/L — ABNORMAL LOW (ref 22–32)
Calcium: 8.6 mg/dL — ABNORMAL LOW (ref 8.9–10.3)
Chloride: 107 mmol/L (ref 98–111)
Creatinine, Ser: 0.54 mg/dL (ref 0.44–1.00)
GFR, Estimated: >60 mL/min (ref >60)
Glucose, Bld: 90 mg/dL (ref 70–99)
Potassium: 3.5 mmol/L (ref 3.5–5.1)
Sodium: 136 mmol/L (ref 135–145)
Total Bilirubin: 0.7 mg/dL (ref 0.3–1.2)
Total Protein: 5.5 g/dL — ABNORMAL LOW (ref 6.5–8.1)

## 2020-06-21 LAB — CBC
HCT: 30.1 % — ABNORMAL LOW (ref 36.0–46.0)
Hemoglobin: 10.6 g/dL — ABNORMAL LOW (ref 12.0–15.0)
MCH: 31.2 pg (ref 26.0–34.0)
MCHC: 35.2 g/dL (ref 30.0–36.0)
MCV: 88.5 fL (ref 80.0–100.0)
Platelets: 162 10*3/uL (ref 150–400)
RBC: 3.4 MIL/uL — ABNORMAL LOW (ref 3.87–5.11)
RDW: 12.6 % (ref 11.5–15.5)
WBC: 13 10*3/uL — ABNORMAL HIGH (ref 4.0–10.5)
nRBC: 0 % (ref 0.0–0.2)

## 2020-06-21 LAB — PROTEIN / CREATININE RATIO, URINE
Creatinine, Urine: 159.2 mg/dL
Protein Creatinine Ratio: 0.16 mg/mg{Cre} — ABNORMAL HIGH (ref 0.00–0.15)
Total Protein, Urine: 25 mg/dL

## 2020-06-21 MED ORDER — LACTATED RINGERS IV BOLUS
1000.0000 mL | Freq: Once | INTRAVENOUS | Status: AC
  Administered 2020-06-21: 1000 mL via INTRAVENOUS

## 2020-06-21 MED ORDER — CYCLOBENZAPRINE HCL 5 MG PO TABS
10.0000 mg | ORAL_TABLET | Freq: Once | ORAL | Status: AC
  Administered 2020-06-21: 10 mg via ORAL
  Filled 2020-06-21: qty 2

## 2020-06-21 MED ORDER — DIPHENHYDRAMINE HCL 50 MG/ML IJ SOLN
25.0000 mg | Freq: Once | INTRAMUSCULAR | Status: AC
  Administered 2020-06-21: 25 mg via INTRAVENOUS
  Filled 2020-06-21: qty 1

## 2020-06-21 MED ORDER — DEXAMETHASONE SODIUM PHOSPHATE 10 MG/ML IJ SOLN
10.0000 mg | Freq: Once | INTRAMUSCULAR | Status: AC
  Administered 2020-06-21: 10 mg via INTRAVENOUS
  Filled 2020-06-21: qty 1

## 2020-06-21 MED ORDER — CYCLOBENZAPRINE HCL 10 MG PO TABS: 10.0000 mg | ORAL_TABLET | Freq: Two times a day (BID) | ORAL | 0 refills | Status: DC | PRN

## 2020-06-21 MED ORDER — METOCLOPRAMIDE HCL 5 MG/ML IJ SOLN
10.0000 mg | Freq: Once | INTRAMUSCULAR | Status: AC
  Administered 2020-06-21: 10 mg via INTRAVENOUS
  Filled 2020-06-21: qty 2

## 2020-06-21 NOTE — MAU Note (Addendum)
...  Angelica Kim is a 30 y.o. 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 Last took Fioricet at 2000 yesterday. She also endorses quick sharp left lower pelvic pain that occurs around 5-6 times throughout the day. States she has not felt her baby move since 60. No VB or LOF.   Last meal: 06/19/2020. States she was unable to keep anything down yesterday. Last PO fluids around 0200.  Pain score: 7/10 HA  FHT: 135 external Lab orders placed from triage: UA

## 2020-06-21 NOTE — Discharge Instructions (Signed)
You have a prescription for Flexeril at your pharmacy. You can take that twice daily as needed. Be aware that it does cause drowsiness. Please exercise caution when taking it.

## 2020-06-21 NOTE — MAU Provider Note (Signed)
History     CSN: [id number]  Arrival date and time: 06/21/20 0730   Event Date/Time   First Provider Initiated Contact with Patient 06/21/20 [phone]      Chief Complaint  Patient presents with  . Headache  . Nausea  . Emesis  . Hypertension   Ms. Zendayah Hardgrave is a 30 y.o. year old G64P0101 female at [redacted]w[redacted]d weeks gestation who presents to MAU reporting a headache that "feels like head is going to explode." She describes the pain as starting behind her eyes, but now top of head towards back of head. She rates the pain 7/10. 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 She reports she "stayed in the bathroom most of my shift" vomiting. She reports the last meal that stayed down was on 06/19/2020; last drink was 0200. She reports her BP was 130-140s/70-80s throughout the night. She took Fioricet @ 2000 last night. She also reports a quick sharp in the lower LT pelvic pain about 5-6x/day. She also complains of no FM since 0230. She denies UC's, VB or LOF. She receives [facility] with [facility]. Her pregnancy has been complicated by a h/o severe PEC with her 1st pregnancy and headaches during this pregnancy.   OB History    Gravida  2   Para  1   Term  0   Preterm  1   AB  0   Living  1     SAB  0   IAB  0   Ectopic  0   Multiple  0   Live Births  1           Past Medical History:  Diagnosis Date  . Ankylosing spondylitis (HCC)   . Asthma   . Fibromyalgia   . Kidney stones   . Migraine   . Ovarian cyst   . Preterm labor     Past Surgical History:  Procedure Laterality Date  . COLONOSCOPY    . ESOPHAGOGASTRODUODENOSCOPY ENDOSCOPY    . spinal tap    . WISDOM TOOTH EXTRACTION    . WISDOM TOOTH EXTRACTION  2010    Family History  Problem Relation Age of Onset  . Arthritis Mother   . Hypertension Maternal Grandmother   . Thyroid disease Maternal Grandmother   . Kidney Stones Maternal Grandfather     Social History   Tobacco Use  . Smoking status: Former  Smoker    Quit date: 2014    Years since quitting: 8.3  . Smokeless tobacco: Never Used  Vaping Use  . Vaping Use: Never used  Substance Use Topics  . Alcohol use: Not Currently  . Drug use: No    Allergies:  Allergies  Allergen Reactions  . Amoxicillin Hives, Shortness Of Breath and Swelling    Swelling of hands, face, and lip Has patient had a PCN reaction causing immediate rash, facial/tongue/throat swelling, SOB or lightheadedness with hypotension: Unknown Has patient had a PCN reaction causing severe rash involving mucus membranes or skin necrosis: Yes Has patient had a PCN reaction that required hospitalization: No Has patient had a PCN reaction occurring within the last 10 years: No If all of the above answers are "NO", then may proceed with Cephalosporin use.   Marland Kitchen Zofran [Ondansetron Hcl] Nausea Only    Medications Prior to Admission  Medication Sig Dispense Refill Last Dose  . albuterol (PROVENTIL HFA;VENTOLIN HFA) 108 (90 BASE) MCG/ACT inhaler Inhale 2 puffs into the lungs every 6 (six) hours as  needed for wheezing or shortness of breath.   06/20/2020 at Unknown time  . aspirin 81 MG chewable tablet Chew by mouth daily.   06/20/2020 at Unknown time  . butalbital-acetaminophen-caffeine (FIORICET) 50-325-40 MG tablet Take by mouth 2 (two) times daily as needed for headache.   06/20/2020 at 2000  . mometasone (NASONEX) 50 MCG/ACT nasal spray Place 2 sprays into the nose 2 (two) times daily.    06/20/2020 at Unknown time  . Prenatal Vit-Fe Fumarate-FA (PRENATAL MULTIVITAMIN) TABS tablet Take 1 tablet by mouth daily at 12 noon.   06/20/2020 at Unknown time  . promethazine (PHENERGAN) 25 MG tablet Take 1 tablet (25 mg total) by mouth every 6 (six) hours as needed for nausea or vomiting. 30 tablet 0     Review of Systems  Constitutional: Negative.   HENT: Negative.   Eyes: Negative.   Respiratory: Negative.   Cardiovascular: Negative.   Gastrointestinal: Positive for nausea and  vomiting.  Endocrine: Negative.   Genitourinary: Positive for pelvic pain (lower LT; quick, sharp pain).       DFM since 0230  Musculoskeletal: Negative.   Skin: Negative.   Allergic/Immunologic: Negative.   Neurological: Positive for headaches ("feels like head is going to explode").  Hematological: Negative.   Psychiatric/Behavioral: Negative.    Physical Exam   Blood pressure 116/77, pulse 89, temperature 98.6 F (37 C), temperature source Oral, resp. rate 17, last menstrual period 11/18/2019, SpO2 98 %.  Physical Exam Vitals and nursing note reviewed.  Constitutional:      Appearance: Normal appearance. She is obese.  HENT:     Head: Normocephalic and atraumatic.  Eyes:     Comments: Eyelids appear puffy  Cardiovascular:     Rate and Rhythm: Normal rate.     Pulses: Normal pulses.  Pulmonary:     Effort: Pulmonary effort is normal.  Abdominal:     Palpations: Abdomen is soft.  Genitourinary:    Comments: deferred Musculoskeletal:        General: Normal range of motion.     Cervical back: Normal range of motion.  Skin:    General: Skin is warm and dry.  Neurological:     Mental Status: She is alert and oriented to person, place, and time.  Psychiatric:        Mood and Affect: Mood normal.        Behavior: Behavior normal.        Thought Content: Thought content normal.        Judgment: Judgment normal.    REACTIVE NST - FHR: 125 bpm / moderate variability / accels present / decels absent / TOCO: none   Reassessment @ 1038: Patient rating H/A pain 4/10. Stating "some of the pressure is released." MAU Course  Procedures  MDM CCUA CBC CMP P/C Ratio Serial BP's  Received headache cocktail (LR 1000 mL @ 999 mL/hr with Benadryl 25 mg IVP, Metoclopramide 10 mg IVP, Decadron 10 mg IVP) -- reports some relief. LR 1000 ml @ 999 ml/hr (liter #2) Flexeril 10 mg po -- improved pain, rated 3/10  Results for orders placed or performed during the hospital encounter  of 06/21/20 (from the past 24 hour(s))  Urinalysis, Routine w reflex microscopic Urine, Clean Catch     Status: Abnormal   Collection Time: 06/21/20  7:55 AM  Result Value Ref Range   Color, Urine YELLOW YELLOW   APPearance CLOUDY (A) CLEAR   Specific Gravity, Urine 1.014 1.005 - 1.030  pH 7.0 5.0 - 8.0   Glucose, UA NEGATIVE NEGATIVE mg/dL   Hgb urine dipstick NEGATIVE NEGATIVE   Bilirubin Urine NEGATIVE NEGATIVE   Ketones, ur NEGATIVE NEGATIVE mg/dL   Protein, ur NEGATIVE NEGATIVE mg/dL   Nitrite NEGATIVE NEGATIVE   Leukocytes,Ua NEGATIVE NEGATIVE  Protein / creatinine ratio, urine     Status: Abnormal   Collection Time: 06/21/20  8:56 AM  Result Value Ref Range   Creatinine, Urine 159.20 mg/dL   Total Protein, Urine 25 mg/dL   Protein Creatinine Ratio 0.16 (H) 0.00 - 0.15 mg/mg[Cre]  CBC     Status: Abnormal   Collection Time: 06/21/20  9:02 AM  Result Value Ref Range   WBC 13.0 (H) 4.0 - 10.5 K/uL   RBC 3.40 (L) 3.87 - 5.11 MIL/uL   Hemoglobin 10.6 (L) 12.0 - 15.0 g/dL   HCT 53.6 (L) 46.8 - 03.2 %   MCV 88.5 80.0 - 100.0 fL   MCH 31.2 26.0 - 34.0 pg   MCHC 35.2 30.0 - 36.0 g/dL   RDW 12.2 48.2 - 50.0 %   Platelets 162 150 - 400 K/uL   nRBC 0.0 0.0 - 0.2 %  Comprehensive metabolic panel     Status: Abnormal   Collection Time: 06/21/20  9:02 AM  Result Value Ref Range   Sodium 136 135 - 145 mmol/L   Potassium 3.5 3.5 - 5.1 mmol/L   Chloride 107 98 - 111 mmol/L   CO2 20 (L) 22 - 32 mmol/L   Glucose, Bld 90 70 - 99 mg/dL   BUN <5 (L) 6 - 20 mg/dL   Creatinine, Ser 3.70 0.44 - 1.00 mg/dL   Calcium 8.6 (L) 8.9 - 10.3 mg/dL   Total Protein 5.5 (L) 6.5 - 8.1 g/dL   Albumin 2.7 (L) 3.5 - 5.0 g/dL   AST 13 (L) 15 - 41 U/L   ALT 9 0 - 44 U/L   Alkaline Phosphatase 83 38 - 126 U/L   Total Bilirubin 0.7 0.3 - 1.2 mg/dL   GFR, Estimated >48 >88 mL/min   Anion gap 9 5 - 15      Assessment and Plan  Headache in pregnancy, third trimester - Rx for Flexeril 10 mg 2 times  daily prn pain x 3 days - Information provided on migraine headache and form to record h/a's   [redacted] weeks gestation of pregnancy   - Discharge patient - Keep scheduled appt with [facility] on 06/23/2020 - Patient verbalized an understanding of the plan of care and agrees.   Raelyn Mora, CNM 06/21/2020, 9:33 AM

## 2020-07-04 ENCOUNTER — Encounter: Payer: Self-pay | Admitting: *Deleted

## 2020-07-04 ENCOUNTER — Ambulatory Visit: Payer: Managed Care, Other (non HMO) | Attending: Obstetrics and Gynecology

## 2020-07-04 ENCOUNTER — Other Ambulatory Visit: Payer: Self-pay

## 2020-07-04 ENCOUNTER — Other Ambulatory Visit: Payer: Self-pay | Admitting: *Deleted

## 2020-07-04 ENCOUNTER — Ambulatory Visit: Payer: Managed Care, Other (non HMO) | Admitting: *Deleted

## 2020-07-04 DIAGNOSIS — Z362 Encounter for other antenatal screening follow-up: Secondary | ICD-10-CM

## 2020-07-04 DIAGNOSIS — O09899 Supervision of other high risk pregnancies, unspecified trimester: Secondary | ICD-10-CM

## 2020-07-04 DIAGNOSIS — O09893 Supervision of other high risk pregnancies, third trimester: Secondary | ICD-10-CM

## 2020-07-04 DIAGNOSIS — A0811 Acute gastroenteropathy due to Norwalk agent: Secondary | ICD-10-CM | POA: Insufficient documentation

## 2020-07-04 DIAGNOSIS — Z3A32 32 weeks gestation of pregnancy: Secondary | ICD-10-CM

## 2020-07-04 DIAGNOSIS — Z3689 Encounter for other specified antenatal screening: Secondary | ICD-10-CM | POA: Insufficient documentation

## 2020-07-04 DIAGNOSIS — O09293 Supervision of pregnancy with other poor reproductive or obstetric history, third trimester: Secondary | ICD-10-CM | POA: Diagnosis not present

## 2020-07-27 ENCOUNTER — Telehealth (HOSPITAL_COMMUNITY): Payer: Self-pay | Admitting: *Deleted

## 2020-07-27 ENCOUNTER — Other Ambulatory Visit: Payer: Self-pay | Admitting: Obstetrics & Gynecology

## 2020-07-28 NOTE — Telephone Encounter (Signed)
Preadmission screen  

## 2020-07-29 ENCOUNTER — Telehealth (HOSPITAL_COMMUNITY): Payer: Self-pay | Admitting: *Deleted

## 2020-07-29 ENCOUNTER — Encounter (HOSPITAL_COMMUNITY): Payer: Self-pay | Admitting: *Deleted

## 2020-07-29 NOTE — Telephone Encounter (Signed)
Preadmission screen  

## 2020-07-31 ENCOUNTER — Other Ambulatory Visit: Payer: Self-pay

## 2020-07-31 ENCOUNTER — Encounter (HOSPITAL_COMMUNITY): Payer: Self-pay | Admitting: Obstetrics and Gynecology

## 2020-07-31 ENCOUNTER — Inpatient Hospital Stay (HOSPITAL_COMMUNITY)
Admission: AD | Admit: 2020-07-31 | Discharge: 2020-07-31 | Disposition: A | Discharge status: Home / Self Care | Payer: Managed Care, Other (non HMO) | Attending: Obstetrics and Gynecology | Admitting: Obstetrics and Gynecology

## 2020-07-31 DIAGNOSIS — Z0371 Encounter for suspected problem with amniotic cavity and membrane ruled out: Secondary | ICD-10-CM

## 2020-07-31 DIAGNOSIS — A0811 Acute gastroenteropathy due to Norwalk agent: Secondary | ICD-10-CM

## 2020-07-31 DIAGNOSIS — O1493 Unspecified pre-eclampsia, third trimester: Secondary | ICD-10-CM | POA: Diagnosis not present

## 2020-07-31 DIAGNOSIS — Z3A36 36 weeks gestation of pregnancy: Secondary | ICD-10-CM

## 2020-07-31 LAB — AMNISURE RUPTURE OF MEMBRANE (ROM) NOT AT ARMC: Amnisure ROM: NEGATIVE

## 2020-07-31 NOTE — MAU Provider Note (Signed)
S: Ms. Angelica Kim is a 30 y.o. G2P0101 at [redacted]w[redacted]d  who presents to MAU today complaining of leaking of fluid since around 0000 today. She denies vaginal bleeding. She denies contractions. She reports normal fetal movement. Patient has known pre-eclampsia and plan for IOL at 37 weeks with [place] at MFM on 08/02/2020  O: BP 117/71   Pulse (!) 101   Temp 98 F (36.7 C) (Oral)   Resp 18   LMP 11/18/2019 (Exact Date)   SpO2 100%  GENERAL: Well-developed, well-nourished female in no acute distress.  HEAD: Normocephalic, atraumatic.  CHEST: Normal effort of breathing, regular heart rate ABDOMEN: Soft, nontender, gravid PELVIC: Normal external female genitalia.   Cervical exam:  Dilation: Fingertip Effacement (%): Thick Station: -3 Exam by:: Mathews Robinsons CNM   Fetal Monitoring: Baseline: 140 Variability: moderate Accelerations: 15x15 Decelerations: none Contractions: irregular   Results for orders placed or performed during the hospital encounter of 07/31/20 (from the past 24 hour(s))  Amnisure rupture of membrane (rom)not at [facility]     Status: None   Collection Time: 07/31/20 11:38 AM  Result Value Ref Range   Amnisure ROM NEGATIVE      A: SIUP at [redacted]w[redacted]d  Membranes intact  P:  DC home Pre-eclampsia warning signs reviewed  PTL precautions  Fetal kick counts RX: no new RX  Return to MAU as needed FU with OB as planned   Follow-up Information     [facility] Follow up.   Specialty: Obstetrics and Gynecology Why: As scheduled Contact information: [address] [place] [address] [phone]

## 2020-07-31 NOTE — MAU Note (Signed)
Pt reports to mau with c/o possible SROM. Pt states she woke up in a small puddle of fluid this morning around 0630 and has had a few trickles since then.  Pt reports intercourse last night.  Pt reports some irreg ctx. +FM FHR 130 in triage

## 2020-08-02 ENCOUNTER — Other Ambulatory Visit: Payer: Self-pay

## 2020-08-02 ENCOUNTER — Ambulatory Visit: Payer: Managed Care, Other (non HMO) | Admitting: *Deleted

## 2020-08-02 ENCOUNTER — Encounter: Payer: Self-pay | Admitting: *Deleted

## 2020-08-02 ENCOUNTER — Other Ambulatory Visit (HOSPITAL_COMMUNITY)
Admission: RE | Admit: 2020-08-02 | Discharge: 2020-08-02 | Disposition: A | Discharge status: Home / Self Care | Payer: Managed Care, Other (non HMO) | Source: Ambulatory Visit | Attending: Obstetrics & Gynecology | Admitting: Obstetrics & Gynecology

## 2020-08-02 ENCOUNTER — Ambulatory Visit (HOSPITAL_BASED_OUTPATIENT_CLINIC_OR_DEPARTMENT_OTHER): Payer: Managed Care, Other (non HMO)

## 2020-08-02 VITALS — BP 143/84 | HR 94

## 2020-08-02 DIAGNOSIS — Z3A36 36 weeks gestation of pregnancy: Secondary | ICD-10-CM | POA: Diagnosis not present

## 2020-08-02 DIAGNOSIS — O09293 Supervision of pregnancy with other poor reproductive or obstetric history, third trimester: Secondary | ICD-10-CM

## 2020-08-02 DIAGNOSIS — A0811 Acute gastroenteropathy due to Norwalk agent: Secondary | ICD-10-CM

## 2020-08-02 DIAGNOSIS — O09893 Supervision of other high risk pregnancies, third trimester: Secondary | ICD-10-CM

## 2020-08-02 DIAGNOSIS — Z20822 Contact with and (suspected) exposure to covid-19: Secondary | ICD-10-CM | POA: Insufficient documentation

## 2020-08-02 DIAGNOSIS — O09899 Supervision of other high risk pregnancies, unspecified trimester: Secondary | ICD-10-CM

## 2020-08-02 DIAGNOSIS — Z362 Encounter for other antenatal screening follow-up: Secondary | ICD-10-CM | POA: Diagnosis not present

## 2020-08-02 DIAGNOSIS — Z8759 Personal history of other complications of pregnancy, childbirth and the puerperium: Secondary | ICD-10-CM

## 2020-08-03 ENCOUNTER — Inpatient Hospital Stay (HOSPITAL_COMMUNITY): Payer: Managed Care, Other (non HMO)

## 2020-08-03 ENCOUNTER — Inpatient Hospital Stay (HOSPITAL_COMMUNITY): Payer: Managed Care, Other (non HMO) | Admitting: Anesthesiology

## 2020-08-03 ENCOUNTER — Encounter (HOSPITAL_COMMUNITY): Payer: Self-pay | Admitting: Obstetrics and Gynecology

## 2020-08-03 ENCOUNTER — Inpatient Hospital Stay (HOSPITAL_COMMUNITY)
Admission: RE | Admit: 2020-08-03 | Discharge: 2020-08-05 | DRG: 807 | Disposition: A | Discharge status: Home / Self Care | Payer: Managed Care, Other (non HMO) | Attending: Obstetrics and Gynecology | Admitting: Obstetrics and Gynecology

## 2020-08-03 DIAGNOSIS — Z880 Allergy status to penicillin: Secondary | ICD-10-CM

## 2020-08-03 DIAGNOSIS — Z20822 Contact with and (suspected) exposure to covid-19: Secondary | ICD-10-CM | POA: Diagnosis present

## 2020-08-03 DIAGNOSIS — Z3A37 37 weeks gestation of pregnancy: Secondary | ICD-10-CM | POA: Diagnosis not present

## 2020-08-03 DIAGNOSIS — Z7982 Long term (current) use of aspirin: Secondary | ICD-10-CM | POA: Diagnosis not present

## 2020-08-03 DIAGNOSIS — O9902 Anemia complicating childbirth: Secondary | ICD-10-CM | POA: Diagnosis present

## 2020-08-03 DIAGNOSIS — O139 Gestational [pregnancy-induced] hypertension without significant proteinuria, unspecified trimester: Secondary | ICD-10-CM | POA: Diagnosis present

## 2020-08-03 DIAGNOSIS — Z87891 Personal history of nicotine dependence: Secondary | ICD-10-CM

## 2020-08-03 DIAGNOSIS — O9952 Diseases of the respiratory system complicating childbirth: Secondary | ICD-10-CM | POA: Diagnosis present

## 2020-08-03 DIAGNOSIS — O134 Gestational [pregnancy-induced] hypertension without significant proteinuria, complicating childbirth: Secondary | ICD-10-CM | POA: Diagnosis present

## 2020-08-03 DIAGNOSIS — J45909 Unspecified asthma, uncomplicated: Secondary | ICD-10-CM | POA: Diagnosis present

## 2020-08-03 HISTORY — DX: Gestational (pregnancy-induced) hypertension without significant proteinuria, unspecified trimester: O13.9

## 2020-08-03 LAB — COMPREHENSIVE METABOLIC PANEL
ALT: 16 U/L (ref 0–44)
AST: 38 U/L (ref 15–41)
Albumin: 2.8 g/dL — ABNORMAL LOW (ref 3.5–5.0)
Alkaline Phosphatase: 129 U/L — ABNORMAL HIGH (ref 38–126)
Anion gap: 10 (ref 5–15)
BUN: <5 mg/dL — ABNORMAL LOW (ref 6–20)
CO2: 18 mmol/L — ABNORMAL LOW (ref 22–32)
Calcium: 8.5 mg/dL — ABNORMAL LOW (ref 8.9–10.3)
Chloride: 109 mmol/L (ref 98–111)
Creatinine, Ser: 0.55 mg/dL (ref 0.44–1.00)
GFR, Estimated: >60 mL/min (ref >60)
Glucose, Bld: 69 mg/dL — ABNORMAL LOW (ref 70–99)
Potassium: 3.7 mmol/L (ref 3.5–5.1)
Sodium: 137 mmol/L (ref 135–145)
Total Bilirubin: 0.8 mg/dL (ref 0.3–1.2)
Total Protein: 5.5 g/dL — ABNORMAL LOW (ref 6.5–8.1)

## 2020-08-03 LAB — CBC
HCT: 28.9 % — ABNORMAL LOW (ref 36.0–46.0)
Hemoglobin: 10.1 g/dL — ABNORMAL LOW (ref 12.0–15.0)
MCH: 30.4 pg (ref 26.0–34.0)
MCHC: 34.9 g/dL (ref 30.0–36.0)
MCV: 87 fL (ref 80.0–100.0)
Platelets: 164 10*3/uL (ref 150–400)
RBC: 3.32 MIL/uL — ABNORMAL LOW (ref 3.87–5.11)
RDW: 13.4 % (ref 11.5–15.5)
WBC: 12.4 10*3/uL — ABNORMAL HIGH (ref 4.0–10.5)
nRBC: 0 % (ref 0.0–0.2)

## 2020-08-03 LAB — TYPE AND SCREEN
ABO/RH(D): B POS
Antibody Screen: NEGATIVE

## 2020-08-03 LAB — PROTEIN / CREATININE RATIO, URINE
Creatinine, Urine: 75.89 mg/dL
Protein Creatinine Ratio: 0.28 mg/mg{Cre} — ABNORMAL HIGH (ref 0.00–0.15)
Total Protein, Urine: 21 mg/dL

## 2020-08-03 LAB — SARS CORONAVIRUS 2 (TAT 6-24 HRS): SARS Coronavirus 2: NEGATIVE

## 2020-08-03 MED ORDER — LACTATED RINGERS IV SOLN: INTRAVENOUS | Status: DC

## 2020-08-03 MED ORDER — DIPHENHYDRAMINE HCL 50 MG/ML IJ SOLN
12.5000 mg | INTRAMUSCULAR | Status: DC | PRN
  Administered 2020-08-03 – 2020-08-04 (×2): 12.5 mg via INTRAVENOUS
  Filled 2020-08-03: qty 1

## 2020-08-03 MED ORDER — OXYTOCIN-SODIUM CHLORIDE 30-0.9 UT/500ML-% IV SOLN: 2.5000 [IU]/h | INTRAVENOUS | Status: DC

## 2020-08-03 MED ORDER — LIDOCAINE HCL (PF) 1 % IJ SOLN: 30.0000 mL | INTRAMUSCULAR | Status: DC | PRN

## 2020-08-03 MED ORDER — PHENYLEPHRINE 40 MCG/ML (10ML) SYRINGE FOR IV PUSH (FOR BLOOD PRESSURE SUPPORT): 80.0000 ug | PREFILLED_SYRINGE | INTRAVENOUS | Status: DC | PRN

## 2020-08-03 MED ORDER — ACETAMINOPHEN 325 MG PO TABS
650.0000 mg | ORAL_TABLET | ORAL | Status: DC | PRN
  Administered 2020-08-03: 650 mg via ORAL
  Filled 2020-08-03: qty 2

## 2020-08-03 MED ORDER — FENTANYL-BUPIVACAINE-NACL 0.5-0.125-0.9 MG/250ML-% EP SOLN
12.0000 mL/h | EPIDURAL | Status: DC | PRN
  Administered 2020-08-03: 12 mL/h via EPIDURAL

## 2020-08-03 MED ORDER — TERBUTALINE SULFATE 1 MG/ML IJ SOLN: 0.2500 mg | Freq: Once | INTRAMUSCULAR | Status: DC | PRN

## 2020-08-03 MED ORDER — OXYTOCIN-SODIUM CHLORIDE 30-0.9 UT/500ML-% IV SOLN
1.0000 m[IU]/min | INTRAVENOUS | Status: DC
  Administered 2020-08-03: 2 m[IU]/min via INTRAVENOUS
  Filled 2020-08-03: qty 500

## 2020-08-03 MED ORDER — FLEET ENEMA 7-19 GM/118ML RE ENEM: 1.0000 | ENEMA | RECTAL | Status: DC | PRN

## 2020-08-03 MED ORDER — EPHEDRINE 5 MG/ML INJ: 10.0000 mg | INTRAVENOUS | Status: DC | PRN

## 2020-08-03 MED ORDER — LACTATED RINGERS IV SOLN
500.0000 mL | Freq: Once | INTRAVENOUS | Status: AC
Start: 2020-08-03 — End: 2020-08-03
  Administered 2020-08-03: 500 mL via INTRAVENOUS

## 2020-08-03 MED ORDER — FENTANYL CITRATE (PF) 100 MCG/2ML IJ SOLN
50.0000 ug | INTRAMUSCULAR | Status: DC | PRN
Start: 2020-08-03 — End: 2020-08-04

## 2020-08-03 MED ORDER — LACTATED RINGERS IV SOLN: 500.0000 mL | INTRAVENOUS | Status: DC | PRN

## 2020-08-03 MED ORDER — LIDOCAINE HCL (PF) 1 % IJ SOLN
INTRAMUSCULAR | Status: DC | PRN
  Administered 2020-08-03: 5 mL via EPIDURAL
  Administered 2020-08-03: 4 mL via EPIDURAL

## 2020-08-03 MED ORDER — FENTANYL-BUPIVACAINE-NACL 0.5-0.125-0.9 MG/250ML-% EP SOLN
EPIDURAL | Status: AC
  Filled 2020-08-03: qty 250

## 2020-08-03 MED ORDER — SOD CITRATE-CITRIC ACID 500-334 MG/5ML PO SOLN: 30.0000 mL | ORAL | Status: DC | PRN

## 2020-08-03 MED ORDER — OXYTOCIN BOLUS FROM INFUSION
333.0000 mL | Freq: Once | INTRAVENOUS | Status: AC
  Administered 2020-08-04: 333 mL via INTRAVENOUS

## 2020-08-03 NOTE — Progress Notes (Signed)
Subjective:    Comfortable w/ epidural.   Objective:    VS: BP 128/83   Pulse 68   Temp 97.8 F (36.6 C) (Oral)   Resp 18   Ht 5' (1.524 m)   Wt 77.3 kg   LMP 11/18/2019 (Exact Date)   SpO2 98%   BMI 33.30 kg/m  FHR : baseline 125 / variability moderate / accelerations present / absent decelerations Toco: contractions every 2-4 minutes  Membranes: intact Dilation: 5 Effacement (%): 70 Station: -2 Presentation: Vertex Exam by:: Lyn Records, CNM Pitocin 12 mU/min  Assessment/Plan:   30 y.o. G2P0101 [redacted]w[redacted]d IOL for GHTN    -essentially normotensive w/ 4 outliers since admission, will continue to monitor    -denies neuro symptoms    -CMP wnl    -protein creatinine ratio 0.28  Labor: Progressing on Pitocin, will continue to increase then AROM Preeclampsia:  intake and ouput balanced and labs stable Fetal Wellbeing:  Category I Pain Control:  Epidural I/D:   GBS neg Anticipated MOD:  NSVD  [name] MSN, CNM 08/03/2020 8:50 PM

## 2020-08-03 NOTE — Anesthesia Preprocedure Evaluation (Signed)
Anesthesia Evaluation  Patient identified by MRN, date of birth, ID band Patient awake    Reviewed: Allergy & Precautions, NPO status , Patient's Chart, lab work & pertinent test results  History of Anesthesia Complications Negative for: history of anesthetic complications  Airway Mallampati: II   Neck ROM: Full    Dental   Pulmonary asthma , former smoker,    Pulmonary exam normal        Cardiovascular hypertension (Pre-E), Normal cardiovascular exam     Neuro/Psych  Headaches, PSYCHIATRIC DISORDERS Anxiety    GI/Hepatic negative GI ROS, Neg liver ROS,   Endo/Other  negative endocrine ROS  Renal/GU negative Renal ROS     Musculoskeletal  (+) Arthritis , Fibromyalgia - Ankylosing spondylitis    Abdominal   Peds  Hematology  (+) anemia ,  Plt 164k    Anesthesia Other Findings Covid test negative   Reproductive/Obstetrics                             Anesthesia Physical Anesthesia Plan  ASA: 2  Anesthesia Plan: Epidural   Post-op Pain Management:    Induction:   PONV Risk Score and Plan: 2 and Treatment may vary due to age or medical condition  Airway Management Planned: Natural Airway  Additional Equipment: None  Intra-op Plan:   Post-operative Plan:   Informed Consent: I have reviewed the patients History and Physical, chart, labs and discussed the procedure including the risks, benefits and alternatives for the proposed anesthesia with the patient or authorized representative who has indicated his/her understanding and acceptance.       Plan Discussed with: Anesthesiologist  Anesthesia Plan Comments: (Labs reviewed. Platelets acceptable, patient not taking any blood thinning medications. Per RN, FHR tracing reported to be stable enough for sitting procedure. Risks and benefits discussed with patient, including PDPH, backache, epidural hematoma, failed epidural, blood  pressure changes, allergic reaction, and nerve injury. Patient expressed understanding and wished to proceed.)        Anesthesia Quick Evaluation

## 2020-08-03 NOTE — H&P (Signed)
OB ADMISSION/ HISTORY & PHYSICAL:  Admission Date: 08/03/2020  2:06 PM  Admit Diagnosis: IOL for GHTN   Angelica Kim is a 30 y.o. female G35P0101 [redacted]w[redacted]d presenting for IOL. Hx of pre-eclampsia w/ last preg, hx of SVD 12 months ago. Endorses active FM, denies LOF and vaginal bleeding.   History of current pregnancy: G2P0101   Patient entered care with [facility] at 15+1 wks.   EDC 08/24/20 by LMP and congruent w/ 8 wk U/S.   Anatomy scan:  20 wks, incomplete w/ anterior placenta, F/U anat [place] complete @ 32 wks.   Antenatal testing: for hx pf pre-e w/ preterm labor and birth started at 34 weeks Last evaluation: 36+6  wks vtx, anterior placenta, AFI 17.6, efw 6+9, 48%ile  Significant prenatal events:  Patient Active Problem List   Diagnosis Date Noted   Gestational hypertension 08/03/2020   Allergy to amoxicillin 02/07/2020   History of premature delivery 02/07/2020   Generalized anxiety disorder 04/07/2014   Migraine 12/21/2013    Prenatal Labs: ABO, Rh: B/Positive/-- (12/15 0000) Antibody: Negative (12/15 0000) Rubella: Immune (12/15 0000)  RPR: Nonreactive (12/15 0000)  HBsAg: Negative (12/15 0000)  HIV: Non-reactive (12/15 0000)  GTT: passed 1 hr GBS:   negative GC/CHL: neg/neg Genetics: low-risk female, neg Horizon Tdap/influenza vaccines: declined both   OB History  Gravida Para Term Preterm AB Living  2 1 0 1 0 1  SAB IAB Ectopic Multiple Live Births  0 0 0 0 1    # Outcome Date GA Lbr Len/2nd Weight Sex Delivery Anes PTL Lv  2 Current           1 Preterm 07/31/19 [redacted]w[redacted]d 09:24 / 01:01 2634 g F Vag-Spont EPI  LIV     Birth Comments: WNL    Medical / Surgical History: Past medical history:  Past Medical History:  Diagnosis Date   Ankylosing spondylitis (HCC)    Asthma    Fibromyalgia    History of pre-eclampsia    Kidney stones    Migraine    Ovarian cyst    Preterm labor     Past surgical history:  Past Surgical History:  Procedure Laterality Date   COLONOSCOPY      ESOPHAGOGASTRODUODENOSCOPY ENDOSCOPY     spinal tap     WISDOM TOOTH EXTRACTION     WISDOM TOOTH EXTRACTION  2010   Family History:  Family History  Problem Relation Age of Onset   Arthritis Mother    Hypertension Maternal Grandmother    Thyroid disease Maternal Grandmother    Kidney Stones Maternal Grandfather     Social History:  reports that she quit smoking about 8 years ago. Her smoking use included cigarettes. She has never used smokeless tobacco. She reports previous alcohol use. She reports that she does not use drugs.  Allergies: Amoxicillin and Zofran [ondansetron hcl]   Current Medications at time of admission:  Prior to Admission medications   Medication Sig Start Date End Date Taking? Authorizing Provider  albuterol (PROVENTIL HFA;VENTOLIN HFA) 108 (90 BASE) MCG/ACT inhaler Inhale 2 puffs into the lungs every 6 (six) hours as needed for wheezing or shortness of breath.   Yes [provider]  aspirin 81 MG chewable tablet Chew by mouth daily.   Yes [provider]  butalbital-acetaminophen-caffeine (FIORICET) 50-325-40 MG tablet Take by mouth 2 (two) times daily as needed for headache.   Yes [provider]  cyclobenzaprine (FLEXERIL) 10 MG tablet Take 1 tablet (10 mg total) by  mouth 2 (two) times daily as needed for muscle spasms. 06/21/20  Yes Arita Miss, Rolitta, CNM  mometasone (NASONEX) 50 MCG/ACT nasal spray Place 2 sprays into the nose 2 (two) times daily.    Yes [provider]  Prenatal Vit-Fe Fumarate-FA (PRENATAL MULTIVITAMIN) TABS tablet Take 1 tablet by mouth daily at 12 noon.   Yes [provider]  promethazine (PHENERGAN) 25 MG tablet Take 1 tablet (25 mg total) by mouth every 6 (six) hours as needed for nausea or vomiting. 06/07/20  Yes Simpson, Danielle L, CNM  metoCLOPramide (REGLAN) 10 MG tablet Take 1 tablet (10 mg total) by mouth every 6 (six) hours. 03/22/17 07/31/18  [name], Mia A, PA-C    Review of  Systems: Constitutional: Negative   HENT: Negative   Eyes: Negative   Respiratory: Negative   Cardiovascular: Negative   Gastrointestinal: Negative  Genitourinary: neg for bloody show, neg for LOF   Musculoskeletal: Negative   Skin: Negative   Neurological: Negative   Endo/Heme/Allergies: Negative   Psychiatric/Behavioral: Negative    Physical Exam: VS: Blood pressure 130/90, pulse 91, temperature 99 F (37.2 C), temperature source Oral, height 5' (1.524 m), weight 77.3 kg, last menstrual period 11/18/2019, currently breastfeeding. AAO x3, no signs of distress Cardiovascular: RRR Respiratory: Lung fields clear to ausculation GU/GI: Abdomen gravid, non-tender, non-distended, active FM, vertex, EFW 6.5# per Leopold's Extremities: neg edema, negative for pain, tenderness, and cords  Cervical exam: 2/70/-3 by V.[name], CNM FHR: baseline rate 140 / variability moderate / accelerations present / absent decelerations TOCO: irreg   Prenatal Transfer Tool  Maternal Diabetes: No Genetic Screening: Normal Maternal Ultrasounds/Referrals: Normal Fetal Ultrasounds or other Referrals:  None Maternal Substance Abuse:  No Significant Maternal Medications:  Meds include: Other:  baby aspirin, Fioricet, flexeril Significant Maternal Lab Results: Group B Strep negative    Assessment: 30 y.o. G2P0101 [redacted]w[redacted]d IOL for GHTN  FHR category 1 GBS neg Pain management plan: epidural   Plan:  Admit to L&D Routine admission orders Epidural PRN Pitocin (favorable cervix)  Dr Su Hilt notified of admission and plan of care  [name] MSN, CNM 08/03/2020 3:04 PM

## 2020-08-03 NOTE — Anesthesia Procedure Notes (Signed)
Epidural Patient location during procedure: OB Start time: 08/03/2020 5:07 PM End time: 08/03/2020 5:10 PM  Staffing Anesthesiologist: Beryle Lathe, MD Performed: anesthesiologist   Preanesthetic Checklist Completed: patient identified, IV checked, risks and benefits discussed, monitors and equipment checked, pre-op evaluation and timeout performed  Epidural Patient position: sitting Prep: DuraPrep Patient monitoring: continuous pulse ox and blood pressure Approach: midline Location: L2-L3 Injection technique: LOR saline  Needle:  Needle type: Tuohy  Needle gauge: 17 G Needle length: 9 cm Needle insertion depth: 8 cm Catheter size: 19 Gauge Catheter at skin depth: 13 cm Test dose: negative and Other (1% lidocaine)  Assessment Events: blood not aspirated  Additional Notes Patient identified. Risks including, but not limited to, bleeding, infection, nerve damage, paralysis, inadequate analgesia, blood pressure changes, nausea, vomiting, allergic reaction, postpartum back pain, itching, and headache were discussed. Patient expressed understanding and wished to proceed. Sterile prep and drape, including hand hygiene, mask, and sterile gloves were used. The patient was positioned and the spine was prepped. The skin was anesthetized with lidocaine. No paraesthesia or other complication noted. The patient did not experience any signs of intravascular injection such as tinnitus or metallic taste in mouth, nor signs of intrathecal spread such as rapid motor block. Please see nursing notes for vital signs. The patient tolerated the procedure well.   Leslye Peer, MDReason for block:procedure for pain

## 2020-08-04 ENCOUNTER — Encounter (HOSPITAL_COMMUNITY): Payer: Self-pay

## 2020-08-04 LAB — RPR: RPR Ser Ql: NONREACTIVE

## 2020-08-04 MED ORDER — SIMETHICONE 80 MG PO CHEW: 80.0000 mg | CHEWABLE_TABLET | ORAL | Status: DC | PRN

## 2020-08-04 MED ORDER — IBUPROFEN 600 MG PO TABS
600.0000 mg | ORAL_TABLET | Freq: Four times a day (QID) | ORAL | Status: DC
  Administered 2020-08-04 – 2020-08-05 (×5): 600 mg via ORAL
  Filled 2020-08-04 (×5): qty 1

## 2020-08-04 MED ORDER — BUTALBITAL-APAP-CAFFEINE 50-325-40 MG PO TABS
2.0000 | ORAL_TABLET | Freq: Four times a day (QID) | ORAL | Status: DC | PRN
  Administered 2020-08-04: 2 via ORAL
  Filled 2020-08-04: qty 2

## 2020-08-04 MED ORDER — ZOLPIDEM TARTRATE 5 MG PO TABS: 5.0000 mg | ORAL_TABLET | Freq: Every evening | ORAL | Status: DC | PRN

## 2020-08-04 MED ORDER — TRANEXAMIC ACID-NACL 1000-0.7 MG/100ML-% IV SOLN
1000.0000 mg | INTRAVENOUS | Status: AC
  Administered 2020-08-04: 1000 mg via INTRAVENOUS

## 2020-08-04 MED ORDER — WITCH HAZEL-GLYCERIN EX PADS: 1.0000 "application " | MEDICATED_PAD | CUTANEOUS | Status: DC | PRN

## 2020-08-04 MED ORDER — SENNOSIDES-DOCUSATE SODIUM 8.6-50 MG PO TABS
2.0000 | ORAL_TABLET | ORAL | Status: DC
  Administered 2020-08-04: 2 via ORAL
  Filled 2020-08-04 (×2): qty 2

## 2020-08-04 MED ORDER — DIBUCAINE (PERIANAL) 1 % EX OINT: 1.0000 "application " | TOPICAL_OINTMENT | CUTANEOUS | Status: DC | PRN

## 2020-08-04 MED ORDER — ACETAMINOPHEN 325 MG PO TABS: 650.0000 mg | ORAL_TABLET | ORAL | Status: DC | PRN

## 2020-08-04 MED ORDER — TETANUS-DIPHTH-ACELL PERTUSSIS 5-2.5-18.5 LF-MCG/0.5 IM SUSY: 0.5000 mL | PREFILLED_SYRINGE | Freq: Once | INTRAMUSCULAR | Status: DC

## 2020-08-04 MED ORDER — PRENATAL MULTIVITAMIN CH
1.0000 | ORAL_TABLET | Freq: Every day | ORAL | Status: DC
  Administered 2020-08-04 – 2020-08-05 (×2): 1 via ORAL
  Filled 2020-08-04 (×2): qty 1

## 2020-08-04 MED ORDER — COCONUT OIL OIL: 1.0000 "application " | TOPICAL_OIL | Status: DC | PRN

## 2020-08-04 MED ORDER — BENZOCAINE-MENTHOL 20-0.5 % EX AERO: 1.0000 "application " | INHALATION_SPRAY | CUTANEOUS | Status: DC | PRN

## 2020-08-04 MED ORDER — TRANEXAMIC ACID-NACL 1000-0.7 MG/100ML-% IV SOLN
INTRAVENOUS | Status: AC
  Filled 2020-08-04: qty 100

## 2020-08-04 MED ORDER — DIPHENHYDRAMINE HCL 25 MG PO CAPS: 25.0000 mg | ORAL_CAPSULE | Freq: Four times a day (QID) | ORAL | Status: DC | PRN

## 2020-08-04 MED ORDER — PROMETHAZINE HCL 25 MG PO TABS
25.0000 mg | ORAL_TABLET | Freq: Four times a day (QID) | ORAL | Status: DC | PRN
  Administered 2020-08-04: 25 mg via ORAL
  Filled 2020-08-04: qty 1

## 2020-08-04 NOTE — Anesthesia Postprocedure Evaluation (Signed)
Anesthesia Post Note  Patient: Angelica Kim  Procedure(s) Performed: AN AD HOC LABOR EPIDURAL     Patient location during evaluation: Mother Baby Anesthesia Type: Epidural Level of consciousness: awake and alert Pain management: pain level controlled Vital Signs Assessment: post-procedure vital signs reviewed and stable Respiratory status: spontaneous breathing, nonlabored ventilation and respiratory function stable Cardiovascular status: stable Postop Assessment: no headache, no backache and epidural receding Anesthetic complications: no   No notable events documented.  Last Vitals:  Vitals:   08/04/20 1145 08/04/20 1855  BP: 132/88 (!) 149/79  Pulse: 66   Resp: 20 20  Temp: 37 C 37.1 C  SpO2: 100% 99%    Last Pain:  Vitals:   08/04/20 1855  TempSrc: Oral  PainSc: 0-No pain   Pain Goal: Patients Stated Pain Goal: 3 (08/03/20 1655)                 Rica Records

## 2020-08-04 NOTE — Progress Notes (Signed)
Patient ID: Angelica Kim, female   DOB: 01-31-1990, 30 y.o.   MRN: [id number] I was called to patinet's room at approximately 0412 for FHR in the 60s Report given to me as I enetered the room by the nurse midwife, Rhea Pink Had been down at that point for about 10-12 minutes by that point C/P Station by my exam +2, my assess,ment was this was appropraite for vacuum assistance  The patient was already consented for this Mighty vac placed and delivered with 2 contractions at 0417 Spontaneous cry and skin to skin begun NICU personnel were present if needed  [name], MD 08/04/2020 4:23 AM

## 2020-08-04 NOTE — Progress Notes (Signed)
Subjective:    Comfortable w/ epidural and resting.   Objective:    VS: BP 114/69   Pulse 76   Temp 98.1 F (36.7 C) (Oral)   Resp 18   Ht 5' (1.524 m)   Wt 77.3 kg   SpO2 98%   Breastfeeding Unknown   BMI 33.30 kg/m  FHR : baseline 125 / variability moderate / accelerations present / absent decelerations Toco: contractions every 2-3 minutes  Membranes: AROM, clear, moderate Dilation: 6 Effacement (%): 80 Station: -3 Presentation: Vertex Exam by:: Rhea Pink, CNM Pitocin 16 mU/min, reduced to 10 mU/min  Assessment/Plan:   30 y.o. G2P0101 37 w 1 d IOL for GHTN   Labor:  progressing on Pitocin Preeclampsia:  intake and ouput balanced and labs stable Fetal Wellbeing:  Category I Pain Control:  Epidural I/D:   GBS neg Anticipated MOD:  NSVD  [name] MSN, CNM 08/04/2020 1:54 AM

## 2020-08-04 NOTE — Social Work (Signed)
MOB was referred for history of anxiety.  * Referral screened out by Clinical Social Worker because none of the following criteria appear to apply: ~ History of anxiety/depression during this pregnancy, or of post-partum depression following prior delivery. ~ Diagnosis of anxiety and/or depression within last 3 years. Per chart review, it is noted MOB was diagnosed with anxiety in 2016.  OR * MOB's symptoms currently being treated with medication and/or therapy.  Please contact the Clinical Social Worker if needs arise, by MOB request, or if MOB scores greater than 9/yes to question 10 on Edinburgh Postpartum Depression Screen.  Dolce Sylvia, MSW, LCSW [facility]  Clinical Social Worker  [phone] 08/04/2020  3:42 PM   

## 2020-08-04 NOTE — Progress Notes (Signed)
Patient complaining of nausea. Kim Holshouer notified due to allergy to Zofran. Order for Phernergan 25mg  PRN for nausea and vomiting put in by midwife .

## 2020-08-04 NOTE — Lactation Note (Signed)
This note was copied from a baby's chart. Lactation Consultation Note  Patient Name: Angelica Kim Today's Date: 08/04/2020   Age:30 hours  LC attempted to see Mom but she was sleeping.  Judee Clara 08/04/2020, 1:24 PM

## 2020-08-04 NOTE — Lactation Note (Signed)
This note was copied from a baby's chart. Lactation Consultation Note Baby unable to maintain latch. Off and on. Baby sometimes will open wide then clamp close to narrow latch. Sometimes baby will not open wide enough for latch. Baby is tongue thrusting pushing nipple out of mouth. The only way baby can maintain suck is for LC to t-cup nipple and hold it in baby's mouth. When breast let go baby lets go. Baby cries when looses latch. Gets off breast then back on, constant repeat. Mom to sleepy to hold breast. FOB sleeping on cough. Mom very sleepy falling asleep during feeding. RN came into rm. Going to weight baby.  Patient Name: Angelica Kim [id number] Date: 08/04/2020 Reason for consult: L&D Initial assessment;Early term 37-38.6wks Age:61 hours  Maternal Data Does the patient have breastfeeding experience prior to this delivery?: Yes  Feeding    LATCH Score Latch: Repeated attempts needed to sustain latch, nipple held in mouth throughout feeding, stimulation needed to elicit sucking reflex.  Audible Swallowing: None  Type of Nipple: Everted at rest and after stimulation (short shaft compressible)  Comfort (Breast/Nipple): Soft / non-tender  Hold (Positioning): Full assist, staff holds infant at breast  LATCH Score: 5   Lactation Tools Discussed/Used    Interventions Interventions: Adjust position;Assisted with latch;Support pillows;Skin to skin;Position options;Breast massage;Hand express;Breast compression  Discharge    Consult Status Consult Status: Follow-up Date: 08/04/20 Follow-up type: In-patient    Charyl Dancer 08/04/2020, 5:37 AM

## 2020-08-05 LAB — COMPREHENSIVE METABOLIC PANEL
ALT: 12 U/L (ref 0–44)
AST: 27 U/L (ref 15–41)
Albumin: 2.3 g/dL — ABNORMAL LOW (ref 3.5–5.0)
Alkaline Phosphatase: 117 U/L (ref 38–126)
Anion gap: 8 (ref 5–15)
BUN: 6 mg/dL (ref 6–20)
CO2: 18 mmol/L — ABNORMAL LOW (ref 22–32)
Calcium: 8.2 mg/dL — ABNORMAL LOW (ref 8.9–10.3)
Chloride: 110 mmol/L (ref 98–111)
Creatinine, Ser: 0.71 mg/dL (ref 0.44–1.00)
GFR, Estimated: >60 mL/min (ref >60)
Glucose, Bld: 76 mg/dL (ref 70–99)
Potassium: 3.5 mmol/L (ref 3.5–5.1)
Sodium: 136 mmol/L (ref 135–145)
Total Bilirubin: 0.6 mg/dL (ref 0.3–1.2)
Total Protein: 4.2 g/dL — ABNORMAL LOW (ref 6.5–8.1)

## 2020-08-05 LAB — CBC
HCT: 24 % — ABNORMAL LOW (ref 36.0–46.0)
Hemoglobin: 8.7 g/dL — ABNORMAL LOW (ref 12.0–15.0)
MCH: 31.2 pg (ref 26.0–34.0)
MCHC: 36.3 g/dL — ABNORMAL HIGH (ref 30.0–36.0)
MCV: 86 fL (ref 80.0–100.0)
Platelets: 144 10*3/uL — ABNORMAL LOW (ref 150–400)
RBC: 2.79 MIL/uL — ABNORMAL LOW (ref 3.87–5.11)
RDW: 13.3 % (ref 11.5–15.5)
WBC: 11.7 10*3/uL — ABNORMAL HIGH (ref 4.0–10.5)
nRBC: 0 % (ref 0.0–0.2)

## 2020-08-05 LAB — SURGICAL PATHOLOGY

## 2020-08-05 LAB — PROTEIN / CREATININE RATIO, URINE
Creatinine, Urine: 51.72 mg/dL
Protein Creatinine Ratio: 0.21 mg/mg{Cre} — ABNORMAL HIGH (ref 0.00–0.15)
Total Protein, Urine: 11 mg/dL

## 2020-08-05 MED ORDER — IBUPROFEN 600 MG PO TABS: 600.0000 mg | ORAL_TABLET | Freq: Four times a day (QID) | ORAL | 0 refills | Status: DC

## 2020-08-05 MED ORDER — NIFEDIPINE ER OSMOTIC RELEASE 30 MG PO TB24
30.0000 mg | ORAL_TABLET | Freq: Every day | ORAL | Status: DC
  Filled 2020-08-05: qty 1

## 2020-08-05 MED ORDER — FERROUS SULFATE 325 (65 FE) MG PO TABS: 325.0000 mg | ORAL_TABLET | ORAL | 1 refills | Status: AC

## 2020-08-05 MED ORDER — AMLODIPINE BESYLATE 5 MG PO TABS: 5.0000 mg | ORAL_TABLET | Freq: Every day | ORAL | 1 refills | Status: DC

## 2020-08-05 MED ORDER — POLYSACCHARIDE IRON COMPLEX 150 MG PO CAPS: 150.0000 mg | ORAL_CAPSULE | Freq: Every day | ORAL | Status: DC

## 2020-08-05 MED ORDER — ALBUTEROL SULFATE (2.5 MG/3ML) 0.083% IN NEBU: 2.5000 mg | INHALATION_SOLUTION | Freq: Four times a day (QID) | RESPIRATORY_TRACT | Status: DC | PRN

## 2020-08-05 MED ORDER — ACETAMINOPHEN 325 MG PO TABS: 650.0000 mg | ORAL_TABLET | ORAL | Status: DC | PRN

## 2020-08-05 MED ORDER — AMLODIPINE BESYLATE 5 MG PO TABS
5.0000 mg | ORAL_TABLET | Freq: Every day | ORAL | Status: DC
  Administered 2020-08-05: 5 mg via ORAL
  Filled 2020-08-05: qty 1

## 2020-08-05 MED ORDER — SODIUM CHLORIDE 0.9 % IV SOLN
500.0000 mg | Freq: Once | INTRAVENOUS | Status: AC
  Administered 2020-08-05: 500 mg via INTRAVENOUS
  Filled 2020-08-05: qty 25

## 2020-08-05 MED ORDER — FERROUS SULFATE 325 (65 FE) MG PO TABS
325.0000 mg | ORAL_TABLET | Freq: Three times a day (TID) | ORAL | Status: DC
  Administered 2020-08-05: 325 mg via ORAL
  Filled 2020-08-05: qty 1

## 2020-08-05 NOTE — Discharge Summary (Signed)
[facility] OB Discharge Summary  Patient Name: Angelica Kim DOB: [birth year] MRN: [id number]  Date of admission: 08/03/2020 Intrauterine pregnancy: Unknown   Admitting diagnosis: Gestational hypertension [O13.9] Secondary diagnosis:  Date of discharge: 08/05/2020    Discharge diagnosis: Anemia     Prenatal history: [id number]   EDC : Not found.  P[facility] at [facility]  Primary provider : [facility] Prenatal course complicated by GHTN, anemia  Prenatal Labs: ABO, Rh: --/--/B POS (07/06 1530) /  Antibody: NEG (07/06 1530) Rubella: Immune (12/15 0000)  /  RPR: NON REACTIVE (07/06 1530)  HBsAg: Negative (12/15 0000)  HIV: Non-reactive (12/15 0000)  GBS:                                      Hospital course:  Induction of labor With Vacuum Assisted Vaginal Delivery      30 y.o. yo [id number] at 37.0 wks was admitted for IOL for hx of preeclampsia with last pregnancy 12 months ago. Dx with GHTN upon admission. Patient had  labor course as follows:  Membrane Rupture Time/Date: 1:19 AM ,08/04/2020   Delivery Method:Vaginal, Vacuum (Extractor)  Episiotomy: None  Lacerations:  None  Patient had elevated B/P postpartum, no severe range. Labs WNL. Started on Norvasc 5 mg qd. Hgb 10.1 dropped to 8.7. Received IV Iron and is taking ferrous sulfate qod. She is ambulating, tolerating a regular diet, passing flatus, and urinating well. Denies HA, visual disturbances, and epigastric pain. Pt requesting discharge due to sick child at home. Has blood pressure cuff at home. Will schedule telehealth appointment at the beginning of next week. Patient is discharged home in stable condition on 08/05/20.  Newborn Data: Birth date:08/04/2020  Birth time:4:17 AM  Gender:Female  Living status:Living  Apgars:9 ,9  Weight:2926 g  Delivering PROVIDER: Duane Lope H                                                            Complications: None  Newborn Data: Live born female  Birth Weight: 6 lb 7.2 oz (2926 g) APGAR: 9,  9  Newborn Delivery   Birth date/time: 08/04/2020 04:17:00 Delivery type: Vaginal, Vacuum (Extractor)      Baby Feeding: Breast Disposition:home with mother  Post partum procedures: IV Iron infusion  Labs: Lab Results  Component Value Date   WBC 11.7 (H) 08/05/2020   HGB 8.7 (L) 08/05/2020   HCT 24.0 (L) 08/05/2020   MCV 86.0 08/05/2020   PLT 144 (L) 08/05/2020   CMP Latest Ref Rng & Units 08/05/2020  Glucose 70 - 99 mg/dL 76  BUN 6 - 20 mg/dL 6  Creatinine 4.17 - 4.08 mg/dL 1.44  Sodium 818 - 563 mmol/L 136  Potassium 3.5 - 5.1 mmol/L 3.5  Chloride 98 - 111 mmol/L 110  CO2 22 - 32 mmol/L 18(L)  Calcium 8.9 - 10.3 mg/dL 8.2(L)  Total Protein 6.5 - 8.1 g/dL 4.2(L)  Total Bilirubin 0.3 - 1.2 mg/dL 0.6  Alkaline Phos 38 - 126 U/L 117  AST 15 - 41 U/L 27  ALT 0 - 44 U/L 12    Physical Exam @ time of discharge:  Vitals:   08/05/20 0115 08/05/20 0528 08/05/20  1100 08/05/20 1500  BP: (!) 153/98 (!) 152/82 138/79 (!) 146/96  Pulse:  (!) 57 63 61  Resp:  18 20 18   Temp:  98.1 F (36.7 C) 98.1 F (36.7 C) 98 F (36.7 C)  TempSrc:  Oral Oral Axillary  SpO2:  100% 100%   Weight:      Height:       general: alert, cooperative, and no distress lochia: appropriate uterine fundus: firm perineum: intact incision: N/A extremities: DVT Evaluation: No evidence of DVT seen on physical exam. Negative Homan's sign. No cords or calf tenderness. No significant calf/ankle edema.  Discharge instructions:  "Baby and Me Booklet" and strict preeclampsia precautions. Discharge Medications:  Allergies as of 08/05/2020       Reactions   Amoxicillin Hives, Shortness Of Breath, Swelling   Swelling of hands, face, and lip Has patient had a PCN reaction causing immediate rash, facial/tongue/throat swelling, SOB or lightheadedness with hypotension: Unknown Has patient had a PCN reaction causing severe rash involving mucus membranes or skin necrosis: Yes Has patient had a PCN reaction  that required hospitalization: No Has patient had a PCN reaction occurring within the last 10 years: No If all of the above answers are "NO", then may proceed with Cephalosporin use.   Zofran [ondansetron Hcl] Nausea Only        Medication List     STOP taking these medications    aspirin 81 MG chewable tablet   butalbital-acetaminophen-caffeine 50-325-40 MG tablet Commonly known as: FIORICET   cyclobenzaprine 10 MG tablet Commonly known as: FLEXERIL   mometasone 50 MCG/ACT nasal spray Commonly known as: NASONEX   prenatal multivitamin Tabs tablet   promethazine 25 MG tablet Commonly known as: PHENERGAN       TAKE these medications    acetaminophen 325 MG tablet Commonly known as: Tylenol Take 2 tablets (650 mg total) by mouth every 4 (four) hours as needed (for pain scale < 4).   albuterol 108 (90 Base) MCG/ACT inhaler Commonly known as: VENTOLIN HFA Inhale 2 puffs into the lungs every 6 (six) hours as needed for wheezing or shortness of breath.   amLODipine 5 MG tablet Commonly known as: NORVASC Take 1 tablet (5 mg total) by mouth daily. Start taking on: August 06, 2020   ferrous sulfate 325 (65 FE) MG tablet Take 1 tablet (325 mg total) by mouth every other day.   ibuprofen 600 MG tablet Commonly known as: ADVIL Take 1 tablet (600 mg total) by mouth every 6 (six) hours.       Diet: routine diet Activity: Advance as tolerated. Pelvic rest x 6 weeks.  Follow up:1 week blood pressure check 4-6 weeks for pp visit. Dr August 08, 2020 and Dr Normand Sloop aware of pt status and agreed with POC.   Signed: Connye Burkitt MSN, CNM 08/05/2020, 5:13 PM

## 2020-08-05 NOTE — Progress Notes (Signed)
RN monitoring patients blood pressure throughout shift. Patients blood pressure trending up. Midwife [facility] notified and verbal orders for CBC, CMP, and PCR clean catch given. Orders put in for CBC, CMP, and PCR on 7/7 at 0500.

## 2020-08-05 NOTE — Progress Notes (Signed)
CSW met with MOB in room 506 to at the request of th provider (MOB was tearful during rounds).  When CSW arrived, MOB was pumping, and infant was asleep in her bassinet. CSW offered to return at a later time and MOB declined. CSW explained CSW's role and invited MOB to ask questions. MOB was polite and easy to engage.  MOB acknowledged being tearful during rounds and reported,  "I have a 30 year old that I have never really been away from.  She is not feeling well and it just had me emotional".  MOB communicated she is feeling better due to her mother taking her 1 year to the doctor and finding out she has a virus.  MOB communicated that she has a good support team that consists of MOB's mother and FOB.  MOB denied PMAD symptoms with her oldest child and she declined PMAD resources and education. MOB communicated that she feels comfortable seeking help if help is needed. CSW assessed for safety and MOB denied SI, HI, and DV.   There are no barriers to discharge.   Laurey Arrow, MSW, LCSW Clinical Social Work [phone]

## 2020-08-05 NOTE — Progress Notes (Addendum)
PPD# 1 SVD w/ intact perineum Information for the patient's newborn:  Angelica Kim, Angelica Kim [[id number]]  female   Baby Girl  S:   Reports feeling "HA is better. I just want to go home" Pt crying. Has never been away from her 30 yo. Reassurance and support given. Tolerating PO fluid and solids No nausea or vomiting Bleeding is light Pain controlled with acetaminophen and ibuprofen (OTC) Up ad lib / ambulatory / voiding w/o difficulty Feeding: Breast    O:   VS: BP (!) 152/82 (BP Location: Left Arm)   Pulse (!) 57   Temp 98.1 F (36.7 C) (Oral)   Resp 18   Ht 5' (1.524 m)   Wt 77.3 kg   LMP 11/18/2019 (Exact Date)   SpO2 100%   Breastfeeding Unknown   BMI 33.30 kg/m   LABS:  Recent Labs    08/03/20 1530 08/05/20 0540  WBC 12.4* 11.7*  HGB 10.1* 8.7*  PLT 164 144*   Blood type: --/--/B POS (07/06 1530) Rubella: Immune (12/15 0000)                      I&O: Intake/Output      07/07 0701 07/08 0700 07/08 0701 07/09 0700   Urine (mL/kg/hr)     Blood     Total Output     Net            Physical Exam: Alert and oriented X3 Lungs: Clear and unlabored Heart: regular rate and rhythm / no mumurs Abdomen: soft, non-tender, non-distended  Fundus: firm, non-tender, U-1 Perineum: intact Lochia: minimal Extremities: neg edema, no calf pain or tenderness    A:  PPD # 1  Normal exam Denies HA, visual disturbances and epigastric pain.   P:  Routine post partum orders Awaiting CMP and PCR Called RN to check on lab status Procardia XL 30mg  q day Social work consult today Anticipate D/C on 08/06/20   Plan reviewed w/ Dr. 10/07/20, MSN, CNM 08/05/2020, 10:53 AM

## 2020-08-16 ENCOUNTER — Telehealth (HOSPITAL_COMMUNITY): Payer: Self-pay | Admitting: *Deleted

## 2020-08-16 NOTE — Telephone Encounter (Signed)
Left message to return nurse call.  Duffy Rhody, RN 08-16-2020 1:39pm

## 2020-11-05 ENCOUNTER — Emergency Department (HOSPITAL_COMMUNITY)
Admission: EM | Admit: 2020-11-05 | Discharge: 2020-11-06 | Disposition: A | Discharge status: Home / Self Care | Payer: Managed Care, Other (non HMO) | Attending: Emergency Medicine | Admitting: Emergency Medicine

## 2020-11-05 ENCOUNTER — Encounter (HOSPITAL_COMMUNITY): Payer: Self-pay | Admitting: *Deleted

## 2020-11-05 ENCOUNTER — Other Ambulatory Visit: Payer: Self-pay

## 2020-11-05 DIAGNOSIS — Z87891 Personal history of nicotine dependence: Secondary | ICD-10-CM | POA: Insufficient documentation

## 2020-11-05 DIAGNOSIS — N83201 Unspecified ovarian cyst, right side: Secondary | ICD-10-CM | POA: Insufficient documentation

## 2020-11-05 DIAGNOSIS — K5730 Diverticulosis of large intestine without perforation or abscess without bleeding: Secondary | ICD-10-CM | POA: Insufficient documentation

## 2020-11-05 DIAGNOSIS — R1032 Left lower quadrant pain: Secondary | ICD-10-CM | POA: Diagnosis present

## 2020-11-05 DIAGNOSIS — Z79899 Other long term (current) drug therapy: Secondary | ICD-10-CM | POA: Diagnosis not present

## 2020-11-05 DIAGNOSIS — J45909 Unspecified asthma, uncomplicated: Secondary | ICD-10-CM | POA: Diagnosis not present

## 2020-11-05 DIAGNOSIS — R102 Pelvic and perineal pain: Secondary | ICD-10-CM

## 2020-11-05 DIAGNOSIS — N9489 Other specified conditions associated with female genital organs and menstrual cycle: Secondary | ICD-10-CM | POA: Diagnosis not present

## 2020-11-05 DIAGNOSIS — K5790 Diverticulosis of intestine, part unspecified, without perforation or abscess without bleeding: Secondary | ICD-10-CM

## 2020-11-05 LAB — COMPREHENSIVE METABOLIC PANEL
ALT: 29 U/L (ref 0–44)
AST: 19 U/L (ref 15–41)
Albumin: 4 g/dL (ref 3.5–5.0)
Alkaline Phosphatase: 88 U/L (ref 38–126)
Anion gap: 6 (ref 5–15)
BUN: 13 mg/dL (ref 6–20)
CO2: 29 mmol/L (ref 22–32)
Calcium: 9.3 mg/dL (ref 8.9–10.3)
Chloride: 105 mmol/L (ref 98–111)
Creatinine, Ser: 0.81 mg/dL (ref 0.44–1.00)
GFR, Estimated: >60 mL/min (ref >60)
Glucose, Bld: 73 mg/dL (ref 70–99)
Potassium: 4.1 mmol/L (ref 3.5–5.1)
Sodium: 140 mmol/L (ref 135–145)
Total Bilirubin: 0.7 mg/dL (ref 0.3–1.2)
Total Protein: 6.9 g/dL (ref 6.5–8.1)

## 2020-11-05 LAB — CBC WITH DIFFERENTIAL/PLATELET
Abs Immature Granulocytes: 0.03 10*3/uL (ref 0.00–0.07)
Basophils Absolute: 0.1 10*3/uL (ref 0.0–0.1)
Basophils Relative: 1 %
Eosinophils Absolute: 0.2 10*3/uL (ref 0.0–0.5)
Eosinophils Relative: 1 %
HCT: 44.2 % (ref 36.0–46.0)
Hemoglobin: 14.6 g/dL (ref 12.0–15.0)
Immature Granulocytes: 0 %
Lymphocytes Relative: 32 %
Lymphs Abs: 4.2 10*3/uL — ABNORMAL HIGH (ref 0.7–4.0)
MCH: 29 pg (ref 26.0–34.0)
MCHC: 33 g/dL (ref 30.0–36.0)
MCV: 87.9 fL (ref 80.0–100.0)
Monocytes Absolute: 0.9 10*3/uL (ref 0.1–1.0)
Monocytes Relative: 7 %
Neutro Abs: 7.5 10*3/uL (ref 1.7–7.7)
Neutrophils Relative %: 59 %
Platelets: 255 10*3/uL (ref 150–400)
RBC: 5.03 MIL/uL (ref 3.87–5.11)
RDW: 12.6 % (ref 11.5–15.5)
WBC: 12.9 10*3/uL — ABNORMAL HIGH (ref 4.0–10.5)
nRBC: 0 % (ref 0.0–0.2)

## 2020-11-05 LAB — URINALYSIS, ROUTINE W REFLEX MICROSCOPIC
Bilirubin Urine: NEGATIVE
Glucose, UA: NEGATIVE mg/dL
Hgb urine dipstick: NEGATIVE
Ketones, ur: NEGATIVE mg/dL
Leukocytes,Ua: NEGATIVE
Nitrite: NEGATIVE
Protein, ur: NEGATIVE mg/dL
Specific Gravity, Urine: 1.025 (ref 1.005–1.030)
pH: 6 (ref 5.0–8.0)

## 2020-11-05 LAB — I-STAT BETA HCG BLOOD, ED (MC, WL, AP ONLY): I-stat hCG, quantitative: <5 m[IU]/mL (ref <5)

## 2020-11-05 LAB — LIPASE, BLOOD: Lipase: 57 U/L — ABNORMAL HIGH (ref 11–51)

## 2020-11-05 NOTE — ED Notes (Signed)
Pt stepped out to get something out of her car.

## 2020-11-05 NOTE — ED Provider Notes (Signed)
Emergency Medicine Provider Triage Evaluation Note  Angelica Kim , a 30 y.o. female  was evaluated in triage.  Pt complains of left-sided abdominal pain.  She is 3 months postpartum.  Has not resumed her menstrual cycle.  Initially thought she was cramping due to possible beginnings of cycle.  States she has had pregnancy test which were both positive and negative at home.  She is not currently breast-feeding.  She is also having some left flank left upper abd pain.  No chest pain, shortness of breath.  No pelvic pain, vaginal discharge  Review of Systems  Positive: Abd pain Negative: Fever, emesis, vaginal bleeding  Physical Exam  There were no vitals taken for this visit. Gen:   Awake, no distress   Resp:  Normal effort  MSK:   Moves extremities without difficulty  ABD: diffuse tenderness to left abd, Neg CVA tap Other:    Medical Decision Making  Medically screening exam initiated at 6:33 PM.  Appropriate orders placed.  Angelica Kim was informed that the remainder of the evaluation will be completed by another provider, this initial triage assessment does not replace that evaluation, and the importance of remaining in the ED until their evaluation is complete.  Abd pain   Angelica Kim A, PA-C 11/05/20 1833    Angelica Bale, MD 11/06/20 [phone]

## 2020-11-05 NOTE — ED Triage Notes (Signed)
The pt is c/o lower abd pain and flank pain since October  2nd no bloody urine  no frequency  lmp  no period after  baby deklivery  3 moinths ago  no period since then

## 2020-11-06 ENCOUNTER — Emergency Department (HOSPITAL_COMMUNITY): Payer: Managed Care, Other (non HMO)

## 2020-11-06 LAB — HCG, QUANTITATIVE, PREGNANCY: hCG, Beta Chain, Quant, S: 1 m[IU]/mL (ref <5)

## 2020-11-06 MED ORDER — CIPROFLOXACIN HCL 500 MG PO TABS: 500.0000 mg | ORAL_TABLET | Freq: Two times a day (BID) | ORAL | 0 refills | Status: AC

## 2020-11-06 MED ORDER — FENTANYL CITRATE PF 50 MCG/ML IJ SOSY: 50.0000 ug | PREFILLED_SYRINGE | Freq: Once | INTRAMUSCULAR | Status: DC

## 2020-11-06 MED ORDER — METRONIDAZOLE 500 MG PO TABS: 500.0000 mg | ORAL_TABLET | Freq: Three times a day (TID) | ORAL | 0 refills | Status: AC

## 2020-11-06 MED ORDER — FENTANYL CITRATE PF 50 MCG/ML IJ SOSY
50.0000 ug | PREFILLED_SYRINGE | Freq: Once | INTRAMUSCULAR | Status: AC
Start: 2020-11-06 — End: 2020-11-06
  Administered 2020-11-06: 50 ug via INTRAVENOUS
  Filled 2020-11-06: qty 1

## 2020-11-06 MED ORDER — IBUPROFEN 400 MG PO TABS
600.0000 mg | ORAL_TABLET | Freq: Once | ORAL | Status: AC
  Administered 2020-11-06: 600 mg via ORAL
  Filled 2020-11-06: qty 1

## 2020-11-06 NOTE — Discharge Instructions (Addendum)
Your CT scan read diverticulosis in the left lower quadrant with no evidence of diverticulitis, however you do have an elevated white count and you have tenderness in that area.  Your symptoms could reflect early diverticulitis.  Therefore, we will put you on an antibiotic and have you follow-up with your PCP for recheck in 3 days.  Recommend you follow-up with your OB/GYN to discuss contraceptive options.  You are not pregnant today.

## 2020-11-06 NOTE — ED Notes (Signed)
To CT renal

## 2020-11-06 NOTE — ED Notes (Addendum)
Pt to US.

## 2020-11-06 NOTE — ED Provider Notes (Signed)
[facility] EMERGENCY DEPARTMENT Provider Note   CSN: [id number] Arrival date & time: 11/05/20  1759     History Chief Complaint  Patient presents with   Abdominal Pain    Angelica Kim is a 30 y.o. female.  The history is provided by the patient.  Abdominal Pain Pain location:  LLQ Pain quality: aching and cramping   Pain radiates to:  L flank Pain severity:  Mild Timing:  Constant Progression:  Waxing and waning Chronicity:  New Relieved by:  Nothing Worsened by:  Nothing Ineffective treatments:  None tried Associated symptoms: flatus   Associated symptoms: no chest pain, no chills, no cough, no diarrhea, no dysuria, no fever, no hematochezia, no hematuria, no melena, no nausea, no shortness of breath, no vaginal bleeding, no vaginal discharge and no vomiting    30 year old female presenting to the emergency department with left lower quadrant domino pain.  She is 2 months postpartum and has not yet resumed her menstrual cycles.  She states that she initially developed some cramping in the left lower quadrant and thought she might be resuming her menstrual cycle.  She took pregnancy test at home which were both negative and positive.  She endorses some left-sided left lower quadrant abdominal pain radiating to the left flank.  She states that sometimes feels like her prior episodes of kidney stones.  She denies any pelvic pain or vaginal bleeding or vaginal discharge.  She denies any fevers or chills, dysuria, shortness of breath or chest pain.  Pain is intermittent mild to moderate in intensity and has come on gradually over the last few days.  Past Medical History:  Diagnosis Date   Ankylosing spondylitis (HCC)    Asthma    Fibromyalgia    History of pre-eclampsia    Kidney stones    Migraine    Ovarian cyst    Preterm labor     Patient Active Problem List   Diagnosis Date Noted   Vacuum-assisted vaginal delivery (7/7) 08/04/2020   Gestational  hypertension 08/03/2020   Allergy to amoxicillin 02/07/2020   History of premature delivery 02/07/2020   Generalized anxiety disorder 04/07/2014   Migraine 12/21/2013    Past Surgical History:  Procedure Laterality Date   COLONOSCOPY     ESOPHAGOGASTRODUODENOSCOPY ENDOSCOPY     spinal tap     WISDOM TOOTH EXTRACTION     WISDOM TOOTH EXTRACTION  2010     OB History     Gravida  3   Para  2   Term  0   Preterm  1   AB  0   Living  2      SAB  0   IAB  0   Ectopic  0   Multiple  0   Live Births  2           Family History  Problem Relation Age of Onset   Arthritis Mother    Hypertension Maternal Grandmother    Thyroid disease Maternal Grandmother    Kidney Stones Maternal Grandfather     Social History   Tobacco Use   Smoking status: Former    Types: Cigarettes    Quit date: 2014    Years since quitting: 8.7   Smokeless tobacco: Never  Vaping Use   Vaping Use: Never used  Substance Use Topics   Alcohol use: Not Currently   Drug use: No    Home Medications Prior to Admission medications   Medication [employer]  Date End Date Taking? Authorizing Provider  ciprofloxacin (CIPRO) 500 MG tablet Take 1 tablet (500 mg total) by mouth every 12 (twelve) hours for 7 days. 11/06/20 11/13/20 Yes Ernie Avena, MD  metroNIDAZOLE (FLAGYL) 500 MG tablet Take 1 tablet (500 mg total) by mouth 3 (three) times daily for 7 days. 11/06/20 11/13/20 Yes Ernie Avena, MD  acetaminophen (TYLENOL) 325 MG tablet Take 2 tablets (650 mg total) by mouth every 4 (four) hours as needed (for pain scale < 4). 08/05/20   Holshouser, [name], CNM  albuterol (PROVENTIL HFA;VENTOLIN HFA) 108 (90 BASE) MCG/ACT inhaler Inhale 2 puffs into the lungs every 6 (six) hours as needed for wheezing or shortness of breath.    [provider]  amLODipine (NORVASC) 5 MG tablet Take 1 tablet (5 mg total) by mouth daily. 08/06/20   Holshouser, [name], CNM  ferrous sulfate 325 (65 FE)  MG tablet Take 1 tablet (325 mg total) by mouth every other day. 08/05/20   Holshouser, [name], CNM  ibuprofen (ADVIL) 600 MG tablet Take 1 tablet (600 mg total) by mouth every 6 (six) hours. 08/05/20   Holshouser, [name], CNM  metoCLOPramide (REGLAN) 10 MG tablet Take 1 tablet (10 mg total) by mouth every 6 (six) hours. 03/22/17 07/31/18  [name], Mia A, PA-C    Allergies    Amoxicillin and Zofran [ondansetron hcl]  Review of Systems   Review of Systems  Constitutional:  Negative for chills and fever.  Respiratory:  Negative for cough and shortness of breath.   Cardiovascular:  Negative for chest pain.  Gastrointestinal:  Positive for abdominal pain and flatus. Negative for diarrhea, hematochezia, melena, nausea and vomiting.  Genitourinary:  Negative for dysuria, hematuria, vaginal bleeding and vaginal discharge.  All other systems reviewed and are negative.  Physical Exam Updated Vital Signs BP 117/82 (BP Location: Left Arm)   Pulse 83   Temp 97.9 F (36.6 C) (Oral)   Resp 16   Ht 5' (1.524 m)   Wt 77.3 kg   SpO2 100%   BMI 33.28 kg/m   Physical Exam Vitals and nursing note reviewed.  Constitutional:      General: She is not in acute distress.    Appearance: She is well-developed.  HENT:     Head: Normocephalic and atraumatic.  Eyes:     Conjunctiva/sclera: Conjunctivae normal.  Cardiovascular:     Rate and Rhythm: Normal rate and regular rhythm.     Heart sounds: No murmur heard. Pulmonary:     Effort: Pulmonary effort is normal. No respiratory distress.     Breath sounds: Normal breath sounds.  Abdominal:     Palpations: Abdomen is soft.     Tenderness: There is abdominal tenderness in the left lower quadrant. There is no right CVA tenderness, left CVA tenderness, guarding or rebound.  Musculoskeletal:     Cervical back: Neck supple.  Skin:    General: Skin is warm and dry.  Neurological:     Mental Status: She is alert.    ED Results / Procedures /  Treatments   Labs (all labs ordered are listed, but only abnormal results are displayed) Labs Reviewed  URINALYSIS, ROUTINE W REFLEX MICROSCOPIC - Abnormal; Notable for the following components:      Result Value   APPearance HAZY (*)    All other components within normal limits  CBC WITH DIFFERENTIAL/PLATELET - Abnormal; Notable for the following components:   WBC 12.9 (*)    Lymphs Abs  4.2 (*)    All other components within normal limits  LIPASE, BLOOD - Abnormal; Notable for the following components:   Lipase 57 (*)    All other components within normal limits  COMPREHENSIVE METABOLIC PANEL  HCG, QUANTITATIVE, PREGNANCY  I-STAT BETA HCG BLOOD, ED ([facility], [facility], [facility] ONLY)    EKG None  Radiology CT Renal Stone Study  Result Date: 11/06/2020 CLINICAL DATA:  30 year old female with history of left-sided flank pain. Suspected kidney stone. EXAM: CT ABDOMEN AND PELVIS WITHOUT CONTRAST TECHNIQUE: Multidetector CT imaging of the abdomen and pelvis was performed following the standard protocol without IV contrast. COMPARISON:  CT the abdomen and pelvis 08/04/2018. FINDINGS: Lower chest: Unremarkable. Hepatobiliary: No definite suspicious cystic or solid hepatic lesions are noted on today's noncontrast CT examination. Unenhanced appearance of the gallbladder is normal. Pancreas: No definite pancreatic mass or peripancreatic fluid collections or inflammatory changes are noted on today's noncontrast CT examination. Spleen: Unremarkable. Adrenals/Urinary Tract: There are no abnormal calcifications within the collecting system of either kidney, along the course of either ureter, or within the lumen of the urinary bladder. No hydroureteronephrosis or perinephric stranding to suggest urinary tract obstruction at this time. The unenhanced appearance of the kidneys is unremarkable bilaterally. Unenhanced appearance of the urinary bladder is normal. Bilateral adrenal glands are normal in appearance.  Stomach/Bowel: Unenhanced appearance of the stomach is normal. No pathologic dilatation of small bowel or colon. Numerous colonic diverticulae are noted, particularly in the descending colon and sigmoid colon, without surrounding inflammatory changes to suggest an acute diverticulitis at this time. Normal appendix. Vascular/Lymphatic: No atherosclerotic calcifications are noted in the abdominal aorta or pelvic vasculature. No lymphadenopathy noted in the abdomen or pelvis. Reproductive: Uterus and left ovary are unremarkable in appearance. Well-defined 3.4 x 2.4 x 2.8 cm low-attenuation lesion in the right adnexa, likely a small ovarian cyst. Other: No significant volume of ascites.  No pneumoperitoneum. Musculoskeletal: There are no aggressive appearing lytic or blastic lesions noted in the visualized portions of the skeleton. IMPRESSION: 1. No acute findings are noted in the abdomen or pelvis to account for the patient's symptoms. Specifically, no urinary tract calculi no findings of urinary tract obstruction are noted at this time. 2. 3.4 x 2.4 x 2.8 cm low-attenuation lesion in the right adnexal region, likely a small ovarian cyst. Electronically Signed   By: Trudie Reed M.D.   On: 11/06/2020 09:38   US PELVIC COMPLETE W TRANSVAGINAL AND TORSION R/O  Result Date: 11/06/2020 CLINICAL DATA:  Pelvic pain for 1 week.  3 months postpartum. EXAM: TRANSABDOMINAL AND TRANSVAGINAL ULTRASOUND OF PELVIS DOPPLER ULTRASOUND OF OVARIES TECHNIQUE: Both transabdominal and transvaginal ultrasound examinations of the pelvis were performed. Transabdominal technique was performed for global imaging of the pelvis including uterus, ovaries, adnexal regions, and pelvic cul-de-sac. It was necessary to proceed with endovaginal exam following the transabdominal exam to visualize the adnexa. Color and duplex Doppler ultrasound was utilized to evaluate blood flow to the ovaries. COMPARISON:  Prenatal ultrasound August 02, 2020  FINDINGS: Uterus Measurements: 7.3 x 5.0 x 5.5 cm = volume: 11.4 mL. No fibroids or other mass visualized. Endometrium Thickness: 13.6 mm. There is a circumscribed anechoic structure within the endometrium measuring 0.5 x 0.4 x 0.6 cm. Right ovary Measurements: 4.0 x 3.0 x 3.4 cm = volume: 19.6 mL. On the right ovary there is a complex cystic structure measuring 3.0 x 2.6 x 2.6 cm. Left ovary Measurements: 3.2 x 1.5 x 2.0 cm = volume: 4.8  mL. Normal appearance/no adnexal mass. Pulsed Doppler evaluation of both ovaries demonstrates normal low-resistance arterial and venous waveforms. Other findings No abnormal free fluid. IMPRESSION: 1. Thickened endometrium with a 6 mm anechoic structure eccentric within the uterine fundus. These may represent physiologic findings in a postpartum patient who has not resumed her menstrual periods. However very early intrauterine pregnancy cannot be excluded. 2. Right ovarian cyst demonstrating the daughter cyst sign, usually associated with an appearance of a stimulated follicle. However, given the thickened endometrium, short-term imaging follow-up is recommended, to exclude the remote possibility of ectopic pregnancy. 3. Normal appearance of the left ovary. 4. No free fluid in the pelvis. Electronically Signed   By: Ted Mcalpine M.D.   On: 11/06/2020 12:09    Procedures Procedures   Medications Ordered in ED Medications  ibuprofen (ADVIL) tablet 600 mg (600 mg Oral Given 11/06/20 1137)  fentaNYL (SUBLIMAZE) injection 50 mcg (50 mcg Intravenous Given 11/06/20 1257)    ED Course  I have reviewed the triage vital signs and the nursing notes.  Pertinent labs & imaging results that were available during my care of the patient were reviewed by me and considered in my medical decision making (see chart for details).    MDM Rules/Calculators/A&P                           30 year old female presenting to the emergency department with left lower quadrant domino  pain.  She is 2 months postpartum and has not yet resumed her menstrual cycles.  She states that she initially developed some cramping in the left lower quadrant and thought she might be resuming her menstrual cycle.  She took pregnancy test at home which were both negative and positive.  She endorses some left-sided left lower quadrant abdominal pain radiating to the left flank.  She states that sometimes feels like her prior episodes of kidney stones.  She denies any pelvic pain or vaginal bleeding or vaginal discharge.  She denies any fevers or chills, dysuria, shortness of breath or chest pain.  Pain is intermittent mild to moderate in intensity and has come on gradually over the last few days.  On arrival, the patient was afebrile, hemodynamically stable, saturating well on room air.  Physical exam significant for mild left lower quadrant tenderness palpation, no CVA tenderness, no rebound or guarding.  Initial i-STAT beta-hCG less than 5, negative.  CT abdomen pelvis negative for nephrolithiasis.  Evidence of diverticulosis without diverticulitis.  Lipase was elevated at 57, CMP unremarkable, urinalysis without evidence of UTI, CBC with a leukocytosis to 12.9, no anemia or platelet abnormality.    The patient is overall well-appearing.  IV access was obtained and the patient was administered IV fentanyl for pain control.  She also was administered p.o. Motrin.  A pelvic ultrasound was performed which revealed the following: 1. Thickened endometrium with a 6 mm anechoic structure eccentric  within the uterine fundus. These may represent physiologic findings  in a postpartum patient who has not resumed her menstrual periods.  However very early intrauterine pregnancy cannot be excluded.  2. Right ovarian cyst demonstrating the daughter cyst sign, usually  associated with an appearance of a stimulated follicle. However,  given the thickened endometrium, short-term imaging follow-up is  recommended,  to exclude the remote possibility of ectopic pregnancy.  3. Normal appearance of the left ovary.  4. No free fluid in the pelvis.    I spoke with on-call  OB/GYN regarding the patient's pelvic ultrasound findings.  Recommended obtaining serum hCG, if negative the patient is not pregnant and low concern for ectopic pregnancy or intrauterine pregnancy.  No evidence for ovarian torsion or ruptured ovarian cyst.  Normal appearance of the left ovary which is where the patient's symptoms are.  A serum hCG was obtained and resulted negative.   The patient symptoms could most likely be explained by diverticulosis with potential for developing diverticulitis given the patient's leukocytosis and worsening left lower quadrant abdominal pain.  No noncontrast CT findings to suggest diverticulitis however, given the patient's symptoms, developing leukocytosis, findings on CT to suggest diverticulosis, will cover with oral antibiotics and have the patient follow-up for recheck with her PCP.  Additionally, the patient currently has no form of contraception.  She was advised to follow-up with her OB outpatient for discussion of further contraceptive options.  Overall well-appearing, pain much better controlled, stable for discharge with prescription for Augmentin. Final diagnoses:  Pelvic pain  Diverticulosis    Rx / DC Orders ED Discharge Orders          Ordered    metroNIDAZOLE (FLAGYL) 500 MG tablet  3 times daily        11/06/20 1435    ciprofloxacin (CIPRO) 500 MG tablet  Every 12 hours        11/06/20 1435             Ernie Avena, MD 11/06/20 2119

## 2020-11-15 DIAGNOSIS — M797 Fibromyalgia: Secondary | ICD-10-CM | POA: Insufficient documentation

## 2020-11-28 ENCOUNTER — Ambulatory Visit (INDEPENDENT_AMBULATORY_CARE_PROVIDER_SITE_OTHER): Payer: Managed Care, Other (non HMO)

## 2020-11-28 ENCOUNTER — Encounter: Payer: Self-pay | Admitting: Emergency Medicine

## 2020-11-28 ENCOUNTER — Other Ambulatory Visit: Payer: Self-pay

## 2020-11-28 ENCOUNTER — Ambulatory Visit
Admission: EM | Admit: 2020-11-28 | Discharge: 2020-11-28 | Disposition: A | Discharge status: Home / Self Care | Payer: Managed Care, Other (non HMO) | Attending: Internal Medicine | Admitting: Internal Medicine

## 2020-11-28 DIAGNOSIS — R059 Cough, unspecified: Secondary | ICD-10-CM | POA: Diagnosis not present

## 2020-11-28 DIAGNOSIS — J069 Acute upper respiratory infection, unspecified: Secondary | ICD-10-CM | POA: Diagnosis not present

## 2020-11-28 DIAGNOSIS — Z20822 Contact with and (suspected) exposure to covid-19: Secondary | ICD-10-CM

## 2020-11-28 MED ORDER — BENZONATATE 100 MG PO CAPS: 100.0000 mg | ORAL_CAPSULE | Freq: Three times a day (TID) | ORAL | 0 refills | Status: DC | PRN

## 2020-11-28 MED ORDER — PREDNISONE 20 MG PO TABS: 40.0000 mg | ORAL_TABLET | Freq: Every day | ORAL | 0 refills | Status: AC

## 2020-11-28 NOTE — ED Provider Notes (Signed)
[name]    CSN: [id number] Arrival date & time: 11/28/20  4332      History   Chief Complaint Chief Complaint  Patient presents with   Cough    HPI Angelica Kim is a 30 y.o. female.   Patient presents with nasal congestion and cough that has been present for approximately 1 week.  Patient reports that cough started approximately 2 days ago and is productive with yellow sputum.  Denies fevers, any known sick contacts, shortness of breath, chest pain.  Patient has taken NyQuil with minimal improvement in symptoms.   Cough  Past Medical History:  Diagnosis Date   Ankylosing spondylitis (HCC)    Asthma    Fibromyalgia    History of pre-eclampsia    Kidney stones    Migraine    Ovarian cyst    Preterm labor     Patient Active Problem List   Diagnosis Date Noted   Vacuum-assisted vaginal delivery (7/7) 08/04/2020   Gestational hypertension 08/03/2020   Allergy to amoxicillin 02/07/2020   History of premature delivery 02/07/2020   Generalized anxiety disorder 04/07/2014   Migraine 12/21/2013    Past Surgical History:  Procedure Laterality Date   COLONOSCOPY     ESOPHAGOGASTRODUODENOSCOPY ENDOSCOPY     spinal tap     WISDOM TOOTH EXTRACTION     WISDOM TOOTH EXTRACTION  2010    OB History     Gravida  3   Para  2   Term  0   Preterm  1   AB  0   Living  2      SAB  0   IAB  0   Ectopic  0   Multiple  0   Live Births  2            Home Medications    Prior to Admission medications   Medication Sig Start Date End Date Taking? Authorizing Provider  benzonatate (TESSALON) 100 MG capsule Take 1 capsule (100 mg total) by mouth every 8 (eight) hours as needed for cough. 11/28/20  Yes Mallori Araque, Rolly Salter E, FNP  predniSONE (DELTASONE) 20 MG tablet Take 2 tablets (40 mg total) by mouth daily for 5 days. 11/28/20 12/03/20 Yes Ivory Maduro, Acie Fredrickson, FNP  acetaminophen (TYLENOL) 325 MG tablet Take 2 tablets (650 mg total) by mouth every 4  (four) hours as needed (for pain scale < 4). 08/05/20   Holshouser, [name], CNM  albuterol (PROVENTIL HFA;VENTOLIN HFA) 108 (90 BASE) MCG/ACT inhaler Inhale 2 puffs into the lungs every 6 (six) hours as needed for wheezing or shortness of breath.    [provider]  amLODipine (NORVASC) 5 MG tablet Take 1 tablet (5 mg total) by mouth daily. 08/06/20   Holshouser, [name], CNM  ferrous sulfate 325 (65 FE) MG tablet Take 1 tablet (325 mg total) by mouth every other day. 08/05/20   Holshouser, [name], CNM  ibuprofen (ADVIL) 600 MG tablet Take 1 tablet (600 mg total) by mouth every 6 (six) hours. 08/05/20   Holshouser, [name], CNM  metoCLOPramide (REGLAN) 10 MG tablet Take 1 tablet (10 mg total) by mouth every 6 (six) hours. 03/22/17 07/31/18  [name], Coral Else, PA-C    Family History Family History  Problem Relation Age of Onset   Arthritis Mother    Hypertension Maternal Grandmother    Thyroid disease Maternal Grandmother    Kidney Stones Maternal Grandfather     Social History Social History  Tobacco Use   Smoking status: Former    Types: Cigarettes    Quit date: 2014    Years since quitting: 8.8   Smokeless tobacco: Never  Vaping Use   Vaping Use: Never used  Substance Use Topics   Alcohol use: Not Currently   Drug use: No     Allergies   Amoxicillin and Zofran [ondansetron hcl]   Review of Systems Review of Systems Per HPI  Physical Exam Triage Vital Signs ED Triage Vitals [11/28/20 0957]  Enc Vitals Group     BP 116/83     Pulse Rate 98     Resp 16     Temp 97.8 F (36.6 C)     Temp Source Oral     SpO2 98 %     Weight      Height      Head Circumference      Peak Flow      Pain Score 4     Pain Loc      Pain Edu?      Excl. in GC?    No data found.  Updated Vital Signs BP 116/83 (BP Location: Left Arm)   Pulse 98   Temp 97.8 F (36.6 C) (Oral)   Resp 16   SpO2 98%   Visual Acuity Right Eye Distance:   Left Eye Distance:    Bilateral Distance:    Right Eye Near:   Left Eye Near:    Bilateral Near:     Physical Exam Constitutional:      General: She is not in acute distress.    Appearance: Normal appearance.  HENT:     Head: Normocephalic and atraumatic.     Right Ear: Tympanic membrane and ear canal normal.     Left Ear: Tympanic membrane and ear canal normal.     Nose: Congestion present.     Mouth/Throat:     Mouth: Mucous membranes are moist.     Pharynx: No posterior oropharyngeal erythema.  Eyes:     Extraocular Movements: Extraocular movements intact.     Conjunctiva/sclera: Conjunctivae normal.     Pupils: Pupils are equal, round, and reactive to light.  Cardiovascular:     Rate and Rhythm: Normal rate and regular rhythm.     Pulses: Normal pulses.     Heart sounds: Normal heart sounds.  Pulmonary:     Effort: Pulmonary effort is normal. No respiratory distress.     Breath sounds: Normal breath sounds. No stridor. No wheezing, rhonchi or rales.  Abdominal:     General: Abdomen is flat. Bowel sounds are normal.     Palpations: Abdomen is soft.  Musculoskeletal:        General: Normal range of motion.     Cervical back: Normal range of motion.  Skin:    General: Skin is warm and dry.  Neurological:     General: No focal deficit present.     Mental Status: She is alert and oriented to person, place, and time. Mental status is at baseline.  Psychiatric:        Mood and Affect: Mood normal.        Behavior: Behavior normal.     UC Treatments / Results  Labs (all labs ordered are listed, but only abnormal results are displayed) Labs Reviewed  NOVEL CORONAVIRUS, NAA    EKG   Radiology DG Chest 2 View  Result Date: 11/28/2020 CLINICAL DATA:  Cough. EXAM: CHEST - 2 VIEW  COMPARISON:  None. FINDINGS: The heart size and mediastinal contours are within normal limits. Both lungs are clear. The visualized skeletal structures are unremarkable. IMPRESSION: No active cardiopulmonary  disease. Electronically Signed   By: Lupita Raider M.D.   On: 11/28/2020 10:39    Procedures Procedures (including critical care time)  Medications Ordered in UC Medications - No data to display  Initial Impression / Assessment and Plan / UC Course  I have reviewed the triage vital signs and the nursing notes.  Pertinent labs & imaging results that were available during my care of the patient were reviewed by me and considered in my medical decision making (see chart for details).     Patient presents with symptoms likely from a viral upper respiratory infection. Differential includes bacterial pneumonia, sinusitis, allergic rhinitis, Covid 19. Do not suspect underlying cardiopulmonary process. Symptoms seem unlikely related to ACS, CHF or COPD exacerbations, pneumonia, pneumothorax. Patient is nontoxic appearing and not in need of emergent medical intervention.  COVID-19 PCR pending.  Recommended symptom control with over the counter medications: Daily oral anti-histamine, Oral decongestant or IN corticosteroid, saline irrigations, cepacol lozenges, Robitussin, Delsym, honey tea.  Will prescribe prednisone steroid due to possible asthma exacerbation.  Benzonatate as needed for cough.  Chest x-ray was normal.  Return if symptoms fail to improve in 1-2 weeks or you develop shortness of breath, chest pain, severe headache. Patient states understanding and is agreeable.  Discharged with PCP followup.  Final Clinical Impressions(s) / UC Diagnoses   Final diagnoses:  Viral upper respiratory tract infection with cough  Encounter for laboratory testing for COVID-19 virus     Discharge Instructions      You likely having a viral upper respiratory infection. We recommended symptom control. I expect your symptoms to start improving in the next 1-2 weeks.   1. Take a daily allergy pill/anti-histamine like Zyrtec, Claritin, or Store brand consistently for 2 weeks  2. For congestion you may  try an oral decongestant like Mucinex or sudafed. You may also try intranasal flonase nasal spray or saline irrigations (neti pot, sinus cleanse)  3. For your sore throat you may try cepacol lozenges, salt water gargles, throat spray. Treatment of congestion may also help your sore throat.  4. For cough you may try medication that was prescribed. 5. Take Tylenol or Ibuprofen to help with pain/inflammation  6. Stay hydrated, drink plenty of fluids to keep throat coated and less irritated  Honey Tea For cough/sore throat try using a honey-based tea. Use 3 teaspoons of honey with juice squeezed from half lemon. Place shaved pieces of ginger into 1/2-1 cup of water and warm over stove top. Then mix the ingredients and repeat every 4 hours as needed.   You have been prescribed prednisone steroid to decrease inflammation in chest as well as a cough medication to take as needed.  COVID-19 test is pending.  We will call if it is positive.     ED Prescriptions     Medication Sig Dispense Auth. Provider   predniSONE (DELTASONE) 20 MG tablet Take 2 tablets (40 mg total) by mouth daily for 5 days. 10 tablet [place], [place] E, [place]   benzonatate (TESSALON) 100 MG capsule Take 1 capsule (100 mg total) by mouth every 8 (eight) hours as needed for cough. 21 capsule [place], Acie Fredrickson, [place]      PDMP not reviewed this encounter.   Gustavus Bryant, [place] 11/28/20 1108

## 2020-11-28 NOTE — Discharge Instructions (Signed)
You likely having a viral upper respiratory infection. We recommended symptom control. I expect your symptoms to start improving in the next 1-2 weeks.   1. Take a daily allergy pill/anti-histamine like Zyrtec, Claritin, or Store brand consistently for 2 weeks  2. For congestion you may try an oral decongestant like Mucinex or sudafed. You may also try intranasal flonase nasal spray or saline irrigations (neti pot, sinus cleanse)  3. For your sore throat you may try cepacol lozenges, salt water gargles, throat spray. Treatment of congestion may also help your sore throat.  4. For cough you may try medication that was prescribed. 5. Take Tylenol or Ibuprofen to help with pain/inflammation  6. Stay hydrated, drink plenty of fluids to keep throat coated and less irritated  Honey Tea For cough/sore throat try using a honey-based tea. Use 3 teaspoons of honey with juice squeezed from half lemon. Place shaved pieces of ginger into 1/2-1 cup of water and warm over stove top. Then mix the ingredients and repeat every 4 hours as needed.   You have been prescribed prednisone steroid to decrease inflammation in chest as well as a cough medication to take as needed.  COVID-19 test is pending.  We will call if it is positive.

## 2020-11-28 NOTE — ED Triage Notes (Signed)
Cough since last week. Feels like it's moved into her chest and is coughing up mucus

## 2020-11-29 LAB — NOVEL CORONAVIRUS, NAA: SARS-CoV-2, NAA: NOT DETECTED

## 2020-11-29 LAB — SARS-COV-2, NAA 2 DAY TAT

## 2020-12-07 ENCOUNTER — Encounter (HOSPITAL_COMMUNITY): Payer: Self-pay | Admitting: Radiology

## 2020-12-18 ENCOUNTER — Other Ambulatory Visit: Payer: Self-pay

## 2020-12-18 ENCOUNTER — Emergency Department (HOSPITAL_BASED_OUTPATIENT_CLINIC_OR_DEPARTMENT_OTHER)
Admission: EM | Admit: 2020-12-18 | Discharge: 2020-12-18 | Disposition: A | Discharge status: Home / Self Care | Payer: Managed Care, Other (non HMO) | Attending: Emergency Medicine | Admitting: Emergency Medicine

## 2020-12-18 ENCOUNTER — Encounter (HOSPITAL_BASED_OUTPATIENT_CLINIC_OR_DEPARTMENT_OTHER): Payer: Self-pay

## 2020-12-18 ENCOUNTER — Emergency Department (HOSPITAL_BASED_OUTPATIENT_CLINIC_OR_DEPARTMENT_OTHER): Payer: Managed Care, Other (non HMO)

## 2020-12-18 DIAGNOSIS — N8301 Follicular cyst of right ovary: Secondary | ICD-10-CM | POA: Diagnosis not present

## 2020-12-18 DIAGNOSIS — Z87891 Personal history of nicotine dependence: Secondary | ICD-10-CM | POA: Diagnosis not present

## 2020-12-18 DIAGNOSIS — J45909 Unspecified asthma, uncomplicated: Secondary | ICD-10-CM | POA: Insufficient documentation

## 2020-12-18 DIAGNOSIS — N939 Abnormal uterine and vaginal bleeding, unspecified: Secondary | ICD-10-CM | POA: Diagnosis present

## 2020-12-18 DIAGNOSIS — R9389 Abnormal findings on diagnostic imaging of other specified body structures: Secondary | ICD-10-CM

## 2020-12-18 DIAGNOSIS — N8500 Endometrial hyperplasia, unspecified: Secondary | ICD-10-CM | POA: Diagnosis not present

## 2020-12-18 DIAGNOSIS — D72829 Elevated white blood cell count, unspecified: Secondary | ICD-10-CM | POA: Insufficient documentation

## 2020-12-18 DIAGNOSIS — N83201 Unspecified ovarian cyst, right side: Secondary | ICD-10-CM

## 2020-12-18 LAB — CBC WITH DIFFERENTIAL/PLATELET
Abs Immature Granulocytes: 0.03 10*3/uL (ref 0.00–0.07)
Basophils Absolute: 0.1 10*3/uL (ref 0.0–0.1)
Basophils Relative: 1 %
Eosinophils Absolute: 0.2 10*3/uL (ref 0.0–0.5)
Eosinophils Relative: 2 %
HCT: 39.1 % (ref 36.0–46.0)
Hemoglobin: 13.3 g/dL (ref 12.0–15.0)
Immature Granulocytes: 0 %
Lymphocytes Relative: 33 %
Lymphs Abs: 3.8 10*3/uL (ref 0.7–4.0)
MCH: 29.4 pg (ref 26.0–34.0)
MCHC: 34 g/dL (ref 30.0–36.0)
MCV: 86.3 fL (ref 80.0–100.0)
Monocytes Absolute: 0.9 10*3/uL (ref 0.1–1.0)
Monocytes Relative: 8 %
Neutro Abs: 6.5 10*3/uL (ref 1.7–7.7)
Neutrophils Relative %: 56 %
Platelets: 287 10*3/uL (ref 150–400)
RBC: 4.53 MIL/uL (ref 3.87–5.11)
RDW: 13.3 % (ref 11.5–15.5)
WBC: 11.6 10*3/uL — ABNORMAL HIGH (ref 4.0–10.5)
nRBC: 0 % (ref 0.0–0.2)

## 2020-12-18 LAB — PREGNANCY, URINE: Preg Test, Ur: NEGATIVE

## 2020-12-18 NOTE — Discharge Instructions (Signed)
Take 600 mg ibuprofen every 6 hours  Continue the birth control that was given to you by your obgyn  Call you obgyn tomorrow to schedule a follow up appointment   Please return to the emergency department for any new or worsening symptoms.

## 2020-12-18 NOTE — ED Triage Notes (Signed)
Pt arrives ambulatory to ED with reports of vaginal bleeding since Nov 11th states that she assumed it was her first period after having a baby in July. States Monday she started having heavy clots. Pt reports bleeding is very heavy and her OB GYN can not get her in for [place] until January, was advised to come to ED if bleeding did not stop or pain got worse. Pt reports pain is worsening with heavier bleeding.

## 2020-12-18 NOTE — ED Provider Notes (Signed)
[facility] Provider Note   CSN: [id number] Arrival date & time: 12/18/20  1534     History Chief Complaint  Patient presents with   Vaginal Bleeding    Angelica Kim is a 30 y.o. female.  HPI  29 year old female with a history of ankylosing spondylitis, asthma, fibromyalgia, preeclampsia, kidney stones, migraines, ovarian cyst, preterm labor, who presents emergency department today for evaluation of vaginal bleeding.  States vaginal bleeding has been going on for the last 11 days.  She states she bleeds through about 2 super tampons and about an hour.  She was seen by her OB/GYN a few days ago and started on birth control 3 days ago.  She has seen no improvement in the bleeding as of yet.  She reports she delivered a baby in July of this year and this is the first menstrual cycle she has had since delivery.  She denies any vaginal discharge.  She has some mild left pelvic pain.  She denies any syncope but does have some lightheadedness upon standing.  Past Medical History:  Diagnosis Date   Ankylosing spondylitis ([place])    Asthma    Fibromyalgia    History of pre-eclampsia    Kidney stones    Migraine    Ovarian cyst    Preterm labor     Patient Active Problem List   Diagnosis Date Noted   Vacuum-assisted vaginal delivery (7/7) 08/04/2020   Gestational hypertension 08/03/2020   Allergy to amoxicillin 02/07/2020   History of premature delivery 02/07/2020   Generalized anxiety disorder 04/07/2014   Migraine 12/21/2013    Past Surgical History:  Procedure Laterality Date   COLONOSCOPY     ESOPHAGOGASTRODUODENOSCOPY ENDOSCOPY     spinal tap     WISDOM TOOTH EXTRACTION     WISDOM TOOTH EXTRACTION  2010     OB History     Gravida  3   Para  2   Term  0   Preterm  1   AB  0   Living  2      SAB  0   IAB  0   Ectopic  0   Multiple  0   Live Births  2           Family History  Problem Relation Age of Onset    Arthritis Mother    Hypertension Maternal Grandmother    Thyroid disease Maternal Grandmother    Kidney Stones Maternal Grandfather     Social History   Tobacco Use   Smoking status: Former    Types: Cigarettes    Quit date: 2014    Years since quitting: 8.8   Smokeless tobacco: Never  Vaping Use   Vaping Use: Never used  Substance Use Topics   Alcohol use: Not Currently   Drug use: No    Home Medications Prior to Admission medications   Medication Sig Start Date End Date Taking? Authorizing Provider  acetaminophen (TYLENOL) 325 MG tablet Take 2 tablets (650 mg total) by mouth every 4 (four) hours as needed (for pain scale < 4). 08/05/20   Holshouser, Theone Murdoch, CNM  albuterol (PROVENTIL HFA;VENTOLIN HFA) 108 (90 BASE) MCG/ACT inhaler Inhale 2 puffs into the lungs every 6 (six) hours as needed for wheezing or shortness of breath.    [provider]  amLODipine (NORVASC) 5 MG tablet Take 1 tablet (5 mg total) by mouth daily. 08/06/20   Holshouser, Theone Murdoch, CNM  benzonatate (TESSALON) 100 MG  capsule Take 1 capsule (100 mg total) by mouth every 8 (eight) hours as needed for cough. 11/28/20   Teodora Medici, FNP  ferrous sulfate 325 (65 FE) MG tablet Take 1 tablet (325 mg total) by mouth every other day. 08/05/20   Holshouser, Theone Murdoch, CNM  ibuprofen (ADVIL) 600 MG tablet Take 1 tablet (600 mg total) by mouth every 6 (six) hours. 08/05/20   Holshouser, Theone Murdoch, CNM  metoCLOPramide (REGLAN) 10 MG tablet Take 1 tablet (10 mg total) by mouth every 6 (six) hours. 03/22/17 07/31/18  [name], Mia A, PA-C    Allergies    Amoxicillin and Zofran [ondansetron hcl]  Review of Systems   Review of Systems  Constitutional:  Negative for fever.  HENT:  Negative for sore throat.   Eyes:  Negative for visual disturbance.  Respiratory:  Negative for cough and shortness of breath.   Cardiovascular:  Negative for chest pain.  Gastrointestinal:  Negative for abdominal pain and vomiting.   Genitourinary:  Positive for pelvic pain and vaginal bleeding. Negative for dysuria, hematuria, urgency and vaginal discharge.  Musculoskeletal:  Negative for back pain.  Skin:  Negative for rash.  Neurological:  Positive for light-headedness. Negative for syncope.  All other systems reviewed and are negative.  Physical Exam Updated Vital Signs BP 105/75 (BP Location: Right Arm)   Pulse 96   Temp 98.1 F (36.7 C) (Oral)   Resp 20   Ht 5' (1.524 m)   Wt 72.6 kg   LMP 12/18/2020   SpO2 100%   Breastfeeding No   BMI 31.25 kg/m   Physical Exam Vitals and nursing note reviewed.  Constitutional:      General: She is not in acute distress.    Appearance: She is well-developed.  HENT:     Head: Normocephalic and atraumatic.  Eyes:     Conjunctiva/sclera: Conjunctivae normal.  Cardiovascular:     Rate and Rhythm: Normal rate and regular rhythm.     Heart sounds: No murmur heard. Pulmonary:     Effort: Pulmonary effort is normal. No respiratory distress.     Breath sounds: Normal breath sounds.  Abdominal:     General: Bowel sounds are normal.     Palpations: Abdomen is soft.     Tenderness: There is abdominal tenderness (llq, left pelvic ttp). There is no guarding or rebound.  Genitourinary:    Comments: Deferred (pt had pelvic at obgyn 3 days ago) Musculoskeletal:        General: No swelling.     Cervical back: Neck supple.  Skin:    General: Skin is warm and dry.     Capillary Refill: Capillary refill takes less than 2 seconds.  Neurological:     Mental Status: She is alert.  Psychiatric:        Mood and Affect: Mood normal.    ED Results / Procedures / Treatments   Labs (all labs ordered are listed, but only abnormal results are displayed) Labs Reviewed  CBC WITH DIFFERENTIAL/PLATELET - Abnormal; Notable for the following components:      Result Value   WBC 11.6 (*)    All other components within normal limits  PREGNANCY, URINE     EKG None  Radiology US PELVIC COMPLETE W TRANSVAGINAL AND TORSION R/O  Result Date: 12/18/2020 CLINICAL DATA:  C/o heavy vaginal bleeding w/clots x 11 days. Had abnormal [place] in October. Postpartum vaginal delivery in July. Last menstrual period 11/15/2019. EXAM: TRANSABDOMINAL AND TRANSVAGINAL ULTRASOUND OF  PELVIS DOPPLER ULTRASOUND OF OVARIES TECHNIQUE: Both transabdominal and transvaginal ultrasound examinations of the pelvis were performed. Transabdominal technique was performed for global imaging of the pelvis including uterus, ovaries, adnexal regions, and pelvic cul-de-sac. It was necessary to proceed with endovaginal exam following the transabdominal exam to visualize the endometrium and bilateral ovaries. Color and duplex Doppler ultrasound was utilized to evaluate blood flow to the ovaries. COMPARISON:  Ultrasound pelvis 11/06/2020 FINDINGS: Uterus Measurements: 8.2 x 4.5 x 5.2 cm = volume: 102 mL. No fibroids or other mass visualized. Endometrium Thickness: 18 mm.  No focal abnormality visualized. Right ovary Measurements: 4.1 x 3.8 x 2.9 cm = volume: 24 mL. Interval increase in size of a 3.8 x 3.2 x 2.8 cm hypoechoic cystic lesion within the right ovary with associated internal daughter cysts with associated. No definite intralesional vascularity. Left ovary Not visualized. Pulsed Doppler evaluation of the right ovary demonstrates normal low-resistance arterial and venous waveforms. Other findings No abnormal free fluid. IMPRESSION: 1. Interval increase in size of a complex 3.8 cm cystic lesion. Recommend gynecologic consultation. 2. Thickened endometrium measuring up to 18 mm. Recommend gynecologic consultation. 3. The left ovary is not visualized. Electronically Signed   By: Tish Frederickson M.D.   On: 12/18/2020 18:14    Procedures Procedures   Medications Ordered in ED Medications - No data to display  ED Course  I have reviewed the triage vital signs and the nursing  notes.  Pertinent labs & imaging results that were available during my care of the patient were reviewed by me and considered in my medical decision making (see chart for details).    MDM Rules/Calculators/A&P                          30 y/o female presents for eval of vaginal bleeding that started 11 days ago. Seen by obgyn and started on birth control   Reviewed/interpreted labs CBC with stable hgb, mild chronic leukocytosis Preg test neg  Pelvic US -  1. Interval increase in size of a complex 3.8 cm cystic lesion. Recommend gynecologic consultation. 2. Thickened endometrium measuring up to 18 mm. Recommend gynecologic consultation. 3. The left ovary is not visualized.  Attempted to page pts obgyn for further recommendations however I never received a call back.   Her w/u here is reassuring. She is hemodynamically stable. She just started the birth control 3 days ago so I would not expect her symptoms to be completely resolved at that time. I have recommended she proceed with tx with her ocp's and she should call her obgyn tomorrow for further recommendations and plan for f/u. Advised on return precautions. She voices understanding and is in agreement with plan. All questions answered, pt stable for discharge.  Final Clinical Impression(s) / ED Diagnoses Final diagnoses:  Abnormal uterine bleeding  Thickened endometrium  Cyst of right ovary    Rx / DC Orders ED Discharge Orders     None        Karrie Meres, PA-C 12/18/20 2038    Virgina Norfolk, DO 12/18/20 2055

## 2020-12-30 ENCOUNTER — Emergency Department: Admission: EM | Admit: 2020-12-30 | Discharge: 2020-12-30 | Payer: Managed Care, Other (non HMO)

## 2020-12-30 NOTE — ED Notes (Signed)
Pt supervisor informed that the pt is to go to [facility] for her W/C work up for post exposure

## 2021-01-29 NOTE — L&D Delivery Note (Signed)
Delivery Note Labor onset: 12/08/2021  Labor Onset Time: 2105 Complete dilation at 12:50 AM  FHR second stage Cat 1 Analgesia/Anesthesia intrapartum: Epidural  CNM to bedside Delivery of newborn by Dr. Kathie Rhodes. Aut[place]Lott. Delivery of a viable female at 58. Fetal head delivered in MOA position.  Nuchal cord: none.  Infant placed on maternal abd, dried, and tactile stim.  Cord double clamped after 1 min  RN x3 present for birth.  Cord blood sample collected: Yes Arterial cord blood sample collected: No  Placenta delivered by CNM. Schultz side, intact, with 3 VC.  Placenta to L&D. Uterine tone firm, bleeding small, no clots  No laceration identified.  Repair: N/A QBL/EBL (mL): 225 Complications: none APGAR: APGAR (1 MIN): 9   APGAR (5 MINS): 9   APGAR (10 MINS):   Mom to postpartum.  Baby to Couplet care / Skin to Skin. Circumcision Desires in-patient circ  [name] DNP, CNM 12/09/2021, 3:28 AM

## 2021-02-23 ENCOUNTER — Ambulatory Visit
Admission: EM | Admit: 2021-02-23 | Discharge: 2021-02-23 | Disposition: A | Discharge status: Home / Self Care | Payer: Managed Care, Other (non HMO) | Attending: Internal Medicine | Admitting: Internal Medicine

## 2021-02-23 ENCOUNTER — Encounter: Payer: Self-pay | Admitting: Emergency Medicine

## 2021-02-23 DIAGNOSIS — J209 Acute bronchitis, unspecified: Secondary | ICD-10-CM | POA: Diagnosis not present

## 2021-02-23 DIAGNOSIS — R0689 Other abnormalities of breathing: Secondary | ICD-10-CM

## 2021-02-23 DIAGNOSIS — J988 Other specified respiratory disorders: Secondary | ICD-10-CM

## 2021-02-23 LAB — POCT INFLUENZA A/B
Influenza A, POC: NEGATIVE
Influenza B, POC: NEGATIVE

## 2021-02-23 MED ORDER — ALBUTEROL SULFATE (2.5 MG/3ML) 0.083% IN NEBU
2.5000 mg | INHALATION_SOLUTION | Freq: Four times a day (QID) | RESPIRATORY_TRACT | 0 refills | Status: AC | PRN
Start: 2021-02-23 — End: 2023-10-22

## 2021-02-23 MED ORDER — METHYLPREDNISOLONE 4 MG PO TBPK: ORAL_TABLET | ORAL | 0 refills | Status: DC

## 2021-02-23 MED ORDER — FLUTICASONE PROPIONATE 50 MCG/ACT NA SUSP: 2.0000 | Freq: Every day | NASAL | 0 refills | Status: DC

## 2021-02-23 MED ORDER — CETIRIZINE HCL 10 MG PO TABS: 10.0000 mg | ORAL_TABLET | Freq: Every day | ORAL | 2 refills | Status: DC

## 2021-02-23 MED ORDER — AZITHROMYCIN 250 MG PO TABS: ORAL_TABLET | ORAL | 0 refills | Status: AC

## 2021-02-23 MED ORDER — IBUPROFEN 400 MG PO TABS: 400.0000 mg | ORAL_TABLET | Freq: Three times a day (TID) | ORAL | 0 refills | Status: DC | PRN

## 2021-02-23 NOTE — Discharge Instructions (Addendum)
Based on physical exam findings, I believe that you are suffering from bronchitis and recommend that you begin a Medrol Dosepak and a Z-Pak.  I have prescribed these for you.  I also provided you with a renewal of your albuterol nebs which I recommended to use 4 times daily.  Your rapid flu test today is negative.  Please see the list below for recommended medications, dosages and frequencies to provide relief of your current symptoms:     Azithromycin (Z-Pak):  take 2 tablets the first day, then take 1 tablet daily every day thereafter until complete.   Methylprednisolone (Medrol Dosepak): This is a steroid that will significantly calm your upper and lower airways, please take one row of tablets daily with your breakfast meal starting tomorrow morning until the prescription is complete.      Albuterol HFA: This is a bronchodilator, it relaxes the smooth muscles that constrict your airway in your lungs when you are feeling sick or having inflammation secondary to allergies or upper respiratory infection.  Please inhale 2 puffs twice daily every day using the spacer provided.  You can also inhale 2 more puffs as often as needed throughout the day for aggravating cough, chest tightness, feeling short of breath, wheezing.      Ibuprofen  (Advil, Motrin): This is a good anti-inflammatory medication which addresses aches, pains and inflammation of the upper airways that causes sinus and nasal congestion as well as in the lower airways which makes your cough feel tight and sometimes burn.  I recommend that you take between 400 to 600 mg every 6-8 hours as needed.      Fluticasone (Flonase): This is a steroid nasal spray that you use once daily, 1 spray in each nare.  After 3 to 5 days of use, you will have significant improvement of the inflammation and mucus production that is being caused by exposure to allergens.  This medication can be purchased over-the-counter however I have provided you with a  prescription.      Cetirizine (Zyrtec): This is an excellent second-generation antihistamine that helps to reduce respiratory inflammatory response to viruses and environmental allergens.  Please take 1 tablet daily at bedtime.   Guaifenesin (Robitussin, Mucinex): This is an expectorant.  This helps break up chest congestion and loosen up thick nasal drainage making phlegm and drainage more liquid and therefore easier to remove.  I recommend being 400 mg three times daily as needed.      Promethazine DM: Promethazine is both a nasal decongestant and an antinausea medication that makes most patients feel fairly sleepy.  The DM is dextromethorphan, a cough suppressant found in many over-the-counter cough medications.  Please take 5 mL before bedtime to minimize your cough which will help you sleep better.  I have provided you with a prescription for this medication.      Please follow-up within the next 3 to 5 days either with your primary care provider or urgent care if your symptoms do not resolve.  If you do not have a primary care provider, we will assist you in finding one.

## 2021-02-23 NOTE — ED Provider Notes (Signed)
[name]    CSN: [id number] Arrival date & time: 02/23/21  1509    HISTORY   Chief Complaint  Patient presents with   Chest Pain   Sore Throat   HPI Angelica Kim is a 31 y.o. female. Patient states she began having left sided chest pain and SOB x 3 days ago. Better when sitting down and leaning forward. Breathing has no effect on chest pain. Denies N/V/D, fever. Reports hx of asthma, tried inhaler without relief. Developed a sore throat and nasal congestion yesterday, daughter is also sick. Patient works [employer]  The history is provided by the patient.  Past Medical History:  Diagnosis Date   Ankylosing spondylitis (HCC)    Asthma    Fibromyalgia    History of pre-eclampsia    Kidney stones    Migraine    Ovarian cyst    Preterm labor    Patient Active Problem List   Diagnosis Date Noted   Vacuum-assisted vaginal delivery (7/7) 08/04/2020   Gestational hypertension 08/03/2020   Allergy to amoxicillin 02/07/2020   History of premature delivery 02/07/2020   Generalized anxiety disorder 04/07/2014   Migraine 12/21/2013   Past Surgical History:  Procedure Laterality Date   COLONOSCOPY     ESOPHAGOGASTRODUODENOSCOPY ENDOSCOPY     spinal tap     WISDOM TOOTH EXTRACTION     WISDOM TOOTH EXTRACTION  2010   OB History     Gravida  3   Para  2   Term  0   Preterm  1   AB  0   Living  2      SAB  0   IAB  0   Ectopic  0   Multiple  0   Live Births  2          Home Medications    Prior to Admission medications   Medication Sig Start Date End Date Taking? Authorizing Provider  acetaminophen (TYLENOL) 325 MG tablet Take 2 tablets (650 mg total) by mouth every 4 (four) hours as needed (for pain scale < 4). 08/05/20   Holshouser, [name], CNM  albuterol (PROVENTIL HFA;VENTOLIN HFA) 108 (90 BASE) MCG/ACT inhaler Inhale 2 puffs into the lungs every 6 (six) hours as needed for wheezing or shortness of breath.    [provider]   amLODipine (NORVASC) 5 MG tablet Take 1 tablet (5 mg total) by mouth daily. 08/06/20   Holshouser, [name], CNM  benzonatate (TESSALON) 100 MG capsule Take 1 capsule (100 mg total) by mouth every 8 (eight) hours as needed for cough. 11/28/20   Gustavus Bryant, FNP  ferrous sulfate 325 (65 FE) MG tablet Take 1 tablet (325 mg total) by mouth every other day. 08/05/20   Holshouser, [name], CNM  ibuprofen (ADVIL) 600 MG tablet Take 1 tablet (600 mg total) by mouth every 6 (six) hours. 08/05/20   Holshouser, [name], CNM  metoCLOPramide (REGLAN) 10 MG tablet Take 1 tablet (10 mg total) by mouth every 6 (six) hours. 03/22/17 07/31/18  [name], Coral Else, PA-C   Family History Family History  Problem Relation Age of Onset   Arthritis Mother    Hypertension Maternal Grandmother    Thyroid disease Maternal Grandmother    Kidney Stones Maternal Grandfather    Social History Social History   Tobacco Use   Smoking status: Former    Types: Cigarettes    Quit date: 2014    Years since quitting: 9.0  Smokeless tobacco: Never  Vaping Use   Vaping Use: Never used  Substance Use Topics   Alcohol use: Not Currently   Drug use: No   Allergies   Amoxicillin and Zofran [ondansetron hcl]  Review of Systems Review of Systems Pertinent findings noted in history of present illness.   Physical Exam Triage Vital Signs ED Triage Vitals  Enc Vitals Group     BP 11/25/20 0827 (!) 147/82     Pulse Rate 11/25/20 0827 72     Resp 11/25/20 0827 18     Temp 11/25/20 0827 98.3 F (36.8 C)     Temp Source 11/25/20 0827 Oral     SpO2 11/25/20 0827 98 %     Weight --      Height --      Head Circumference --      Peak Flow --      Pain Score 11/25/20 0826 5     Pain Loc --      Pain Edu? --      Excl. in GC? --   No data found.  Updated Vital Signs BP 111/78 (BP Location: Right Arm)    Pulse (!) 106    Temp 98.1 F (36.7 C) (Oral)    Resp 16    SpO2 98%   Physical Exam Constitutional:       Appearance: She is ill-appearing.  HENT:     Head: Normocephalic and atraumatic.     Salivary Glands: Right salivary gland is not diffusely enlarged or tender. Left salivary gland is not diffusely enlarged or tender.     Right Ear: Tympanic membrane, ear canal and external ear normal.     Left Ear: Tympanic membrane, ear canal and external ear normal.     Nose: Congestion and rhinorrhea present. Rhinorrhea is clear.     Right Sinus: No maxillary sinus tenderness or frontal sinus tenderness.     Left Sinus: No maxillary sinus tenderness.     Mouth/Throat:     Mouth: Mucous membranes are moist.     Pharynx: Pharyngeal swelling, posterior oropharyngeal erythema and uvula swelling present.     Tonsils: No tonsillar exudate. 0 on the right. 0 on the left.  Cardiovascular:     Rate and Rhythm: Normal rate and regular rhythm.     Pulses: Normal pulses.  Pulmonary:     Effort: Pulmonary effort is normal. No accessory muscle usage, prolonged expiration or respiratory distress.     Breath sounds: No stridor. No wheezing, rhonchi or rales.     Comments: Turbulent breath sounds throughout without wheeze, rale, rhonchi. Abdominal:     General: Abdomen is flat. Bowel sounds are normal.     Palpations: Abdomen is soft.  Musculoskeletal:        General: Normal range of motion.  Lymphadenopathy:     Cervical: Cervical adenopathy present.     Right cervical: Superficial cervical adenopathy and posterior cervical adenopathy present.     Left cervical: Superficial cervical adenopathy and posterior cervical adenopathy present.  Skin:    General: Skin is warm and dry.  Neurological:     General: No focal deficit present.     Mental Status: She is alert and oriented to person, place, and time.     Motor: Motor function is intact.     Coordination: Coordination is intact.     Gait: Gait is intact.     Deep Tendon Reflexes: Reflexes are normal and symmetric.  Psychiatric:  Attention and  Perception: Attention and perception normal.        Mood and Affect: Mood and affect normal.        Speech: Speech normal.        Behavior: Behavior normal. Behavior is cooperative.        Thought Content: Thought content normal.    Visual Acuity Right Eye Distance:   Left Eye Distance:   Bilateral Distance:    Right Eye Near:   Left Eye Near:    Bilateral Near:     UC Couse / Diagnostics / Procedures:    EKG  Radiology No results found.  Procedures Procedures (including critical care time)  UC Diagnoses / Final Clinical Impressions(s)   I have reviewed the triage vital signs and the nursing notes.  Pertinent labs & imaging results that were available during my care of the patient were reviewed by me and considered in my medical decision making (see chart for details).   Final diagnoses:  Respiratory infection  Adventitious breath sounds  Acute bronchitis, unspecified organism   Influenza test today is negative.  I believe the patient is suffering from bronchitis, I provided her with a Z-Pak and a Medrol Dosepak at her request.  Patient advised to continue allergy medications and albuterol nebs as well.  Conservative care recommended, return precautions advised.  ED Prescriptions     Medication Sig Dispense Auth. Provider   albuterol (PROVENTIL) (2.5 MG/3ML) 0.083% nebulizer solution Take 3 mLs (2.5 mg total) by nebulization every 6 (six) hours as needed for wheezing or shortness of breath. 360 mL Theadora Rama Scales, PA-C   methylPREDNISolone (MEDROL DOSEPAK) 4 MG TBPK tablet Take 24 mg on day 1, 20 mg on day 2, 16 mg on day 3, 12 mg on day 4, 8 mg on day 5, 4 mg on day 6. 21 tablet Theadora Rama Scales, PA-C   azithromycin (ZITHROMAX) 250 MG tablet Take 2 tablets (500 mg total) by mouth daily for 1 day, THEN 1 tablet (250 mg total) daily for 4 days. 6 tablet Theadora Rama Scales, PA-C   cetirizine (ZYRTEC ALLERGY) 10 MG tablet Take 1 tablet (10 mg total) by mouth  at bedtime. 30 tablet Theadora Rama Scales, PA-C   fluticasone (FLONASE) 50 MCG/ACT nasal spray Place 2 sprays into both nostrils daily. 18 mL Theadora Rama Scales, PA-C   ibuprofen (ADVIL) 400 MG tablet Take 1 tablet (400 mg total) by mouth every 8 (eight) hours as needed for up to 30 doses for fever, headache, mild pain or moderate pain (Respiratory inflammation). 30 tablet Theadora Rama Scales, PA-C      PDMP not reviewed this encounter.  Pending results:  Labs Reviewed  POCT INFLUENZA A/B    Medications Ordered in UC: Medications - No data to display  Disposition Upon Discharge:  Condition: stable for discharge home Home: take medications as prescribed; routine discharge instructions as discussed; follow up as advised.  Patient presented with an acute illness with associated systemic symptoms and significant discomfort requiring urgent management. In my opinion, this is a condition that a prudent lay person (someone who possesses an average knowledge of health and medicine) may potentially expect to result in complications if not addressed urgently such as respiratory distress, impairment of bodily function or dysfunction of bodily organs.   Routine symptom specific, illness specific and/or disease specific instructions were discussed with the patient and/or caregiver at length.   As such, the patient has been evaluated and assessed, work-up was performed  and treatment was provided in alignment with urgent care protocols and evidence based medicine.  Patient/parent/caregiver has been advised that the patient may require follow up for further testing and treatment if the symptoms continue in spite of treatment, as clinically indicated and appropriate.  If the patient was tested for COVID-19, Influenza and/or RSV, then the patient/parent/guardian was advised to isolate at home pending the results of his/her diagnostic coronavirus test and potentially longer if theyre positive. I  have also advised pt that if his/her COVID-19 test returns positive, it's recommended to self-isolate for at least 10 days after symptoms first appeared AND until fever-free for 24 hours without fever reducer AND other symptoms have improved or resolved. Discussed self-isolation recommendations as well as instructions for household member/close contacts as per the [facility] and [place] [facility]HS, and also gave patient the COVID packet with this information.  Patient/parent/caregiver has been [facility] 3-5 days if no better; to PCP or the Emergency Department if new signs and symptoms develop, or if the current signs or symptoms continue to change or worsen for further workup, evaluation and treatment as clinically indicated and appropriate  The patient will follow up with their current PCP if and as advised. If the patient does not currently have a PCP we will assist them in obtaining one.   The patient may need specialty follow up if the symptoms continue, in spite of conservative treatment and management, for further workup, evaluation, consultation and treatment as clinically indicated and appropriate.  Patient/parent/caregiver verbalized understanding and agreement of plan as discussed.  All questions were addressed during visit.  Please see discharge instructions below for further details of plan.  Discharge Instructions:   Discharge Instructions      Based on physical exam findings, I believe that you are suffering from bronchitis and recommend that you begin a Medrol Dosepak and a Z-Pak.  I have prescribed these for you.  I also provided you with a renewal of your albuterol nebs which I recommended to use 4 times daily.  Your rapid flu test today is negative.  Please see the list below for recommended medications, dosages and frequencies to provide relief of your current symptoms:     Azithromycin (Z-Pak):  take 2 tablets the first day, then take 1 tablet daily every day  thereafter until complete.   Methylprednisolone (Medrol Dosepak): This is a steroid that will significantly calm your upper and lower airways, please take one row of tablets daily with your breakfast meal starting tomorrow morning until the prescription is complete.      Albuterol HFA: This is a bronchodilator, it relaxes the smooth muscles that constrict your airway in your lungs when you are feeling sick or having inflammation secondary to allergies or upper respiratory infection.  Please inhale 2 puffs twice daily every day using the spacer provided.  You can also inhale 2 more puffs as often as needed throughout the day for aggravating cough, chest tightness, feeling short of breath, wheezing.      Ibuprofen  (Advil, Motrin): This is a good anti-inflammatory medication which addresses aches, pains and inflammation of the upper airways that causes sinus and nasal congestion as well as in the lower airways which makes your cough feel tight and sometimes burn.  I recommend that you take between 400 to 600 mg every 6-8 hours as needed.      Fluticasone (Flonase): This is a steroid nasal spray that you use once daily, 1  spray in each nare.  After 3 to 5 days of use, you will have significant improvement of the inflammation and mucus production that is being caused by exposure to allergens.  This medication can be purchased over-the-counter however I have provided you with a prescription.      Cetirizine (Zyrtec): This is an excellent second-generation antihistamine that helps to reduce respiratory inflammatory response to viruses and environmental allergens.  Please take 1 tablet daily at bedtime.   Guaifenesin (Robitussin, Mucinex): This is an expectorant.  This helps break up chest congestion and loosen up thick nasal drainage making phlegm and drainage more liquid and therefore easier to remove.  I recommend being 400 mg three times daily as needed.      Promethazine DM: Promethazine is both a nasal  decongestant and an antinausea medication that makes most patients feel fairly sleepy.  The DM is dextromethorphan, a cough suppressant found in many over-the-counter cough medications.  Please take 5 mL before bedtime to minimize your cough which will help you sleep better.  I have provided you with a prescription for this medication.      Please follow-up within the next 3 to 5 days either with your primary care provider or urgent care if your symptoms do not resolve.  If you do not have a primary care provider, we will assist you in finding one.       This office note has been dictated using Teaching laboratory technicianDragon speech recognition software.  Unfortunately, and despite my best efforts, this method of dictation can sometimes lead to occasional typographical or grammatical errors.  I apologize in advance if this occurs.     Theadora RamaMorgan, Dhruvan Gullion Scales, PA-C 02/23/21 1753

## 2021-02-23 NOTE — ED Triage Notes (Addendum)
Patient states she began having left sided chest pain and SOB x 3 days ago. Better when sitting down and leaning forward. Breathing has no effect on chest pain. Denies N/V/D, fever. Reports hx of asthma, tried inhaler without relief. Developed a sore throat and nasal congestion yesterday, daughter is also sick. Patient works [employer]

## 2021-03-22 ENCOUNTER — Other Ambulatory Visit: Payer: Self-pay

## 2021-03-22 ENCOUNTER — Emergency Department (HOSPITAL_BASED_OUTPATIENT_CLINIC_OR_DEPARTMENT_OTHER): Payer: Managed Care, Other (non HMO) | Admitting: Radiology

## 2021-03-22 ENCOUNTER — Encounter (HOSPITAL_BASED_OUTPATIENT_CLINIC_OR_DEPARTMENT_OTHER): Payer: Self-pay

## 2021-03-22 DIAGNOSIS — J45909 Unspecified asthma, uncomplicated: Secondary | ICD-10-CM | POA: Insufficient documentation

## 2021-03-22 DIAGNOSIS — Z87891 Personal history of nicotine dependence: Secondary | ICD-10-CM | POA: Insufficient documentation

## 2021-03-22 DIAGNOSIS — R000 Tachycardia, unspecified: Secondary | ICD-10-CM | POA: Insufficient documentation

## 2021-03-22 DIAGNOSIS — R059 Cough, unspecified: Secondary | ICD-10-CM | POA: Diagnosis present

## 2021-03-22 DIAGNOSIS — U071 COVID-19: Secondary | ICD-10-CM | POA: Diagnosis not present

## 2021-03-22 LAB — CBC
HCT: 39.8 % (ref 36.0–46.0)
Hemoglobin: 13.1 g/dL (ref 12.0–15.0)
MCH: 27.2 pg (ref 26.0–34.0)
MCHC: 32.9 g/dL (ref 30.0–36.0)
MCV: 82.7 fL (ref 80.0–100.0)
Platelets: 243 10*3/uL (ref 150–400)
RBC: 4.81 MIL/uL (ref 3.87–5.11)
RDW: 12.6 % (ref 11.5–15.5)
WBC: 11 10*3/uL — ABNORMAL HIGH (ref 4.0–10.5)
nRBC: 0 % (ref 0.0–0.2)

## 2021-03-22 LAB — URINALYSIS, ROUTINE W REFLEX MICROSCOPIC
Bilirubin Urine: NEGATIVE
Glucose, UA: NEGATIVE mg/dL
Hgb urine dipstick: NEGATIVE
Ketones, ur: NEGATIVE mg/dL
Leukocytes,Ua: NEGATIVE
Nitrite: NEGATIVE
Protein, ur: NEGATIVE mg/dL
Specific Gravity, Urine: 1.024 (ref 1.005–1.030)
pH: 5.5 (ref 5.0–8.0)

## 2021-03-22 LAB — BASIC METABOLIC PANEL
Anion gap: 13 (ref 5–15)
BUN: 14 mg/dL (ref 6–20)
CO2: 21 mmol/L — ABNORMAL LOW (ref 22–32)
Calcium: 9.6 mg/dL (ref 8.9–10.3)
Chloride: 104 mmol/L (ref 98–111)
Creatinine, Ser: 0.99 mg/dL (ref 0.44–1.00)
GFR, Estimated: >60 mL/min (ref >60)
Glucose, Bld: 112 mg/dL — ABNORMAL HIGH (ref 70–99)
Potassium: 3.5 mmol/L (ref 3.5–5.1)
Sodium: 138 mmol/L (ref 135–145)

## 2021-03-22 LAB — PREGNANCY, URINE: Preg Test, Ur: NEGATIVE

## 2021-03-22 LAB — TROPONIN I (HIGH SENSITIVITY): Troponin I (High Sensitivity): <2 ng/L (ref <18)

## 2021-03-22 NOTE — ED Triage Notes (Signed)
Patient here POV from Home with Multiple Complaints.  Endorses Acute Onset of CP (Mid/Left) approximately at 1930. Also endorses 101 Temp at Home at the Same Time. Pain has worsened since.   Also endorses Left Flank Pain as well. No Urinary Symptoms.   Mild Nausea. Did have One Episode of Emesis at that Time.  Tylenol at 2000.   NAD Noted during Triage. A&Ox4. GCS 15. Ambulatory.

## 2021-03-23 ENCOUNTER — Emergency Department (HOSPITAL_BASED_OUTPATIENT_CLINIC_OR_DEPARTMENT_OTHER)
Admission: EM | Admit: 2021-03-23 | Discharge: 2021-03-23 | Disposition: A | Discharge status: Home / Self Care | Payer: Managed Care, Other (non HMO) | Attending: Emergency Medicine | Admitting: Emergency Medicine

## 2021-03-23 DIAGNOSIS — U071 COVID-19: Secondary | ICD-10-CM

## 2021-03-23 LAB — RESP PANEL BY RT-PCR (FLU A&B, COVID) ARPGX2
Influenza A by PCR: NEGATIVE
Influenza B by PCR: NEGATIVE
SARS Coronavirus 2 by RT PCR: POSITIVE — AB

## 2021-03-23 MED ORDER — SODIUM CHLORIDE 0.9 % IV BOLUS
500.0000 mL | Freq: Once | INTRAVENOUS | Status: AC
  Administered 2021-03-23: 500 mL via INTRAVENOUS

## 2021-03-23 MED ORDER — KETOROLAC TROMETHAMINE 30 MG/ML IJ SOLN
30.0000 mg | Freq: Once | INTRAMUSCULAR | Status: AC
Start: 2021-03-23 — End: 2021-03-23
  Administered 2021-03-23: 30 mg via INTRAVENOUS
  Filled 2021-03-23: qty 1

## 2021-03-23 MED ORDER — NIRMATRELVIR/RITONAVIR (PAXLOVID)TABLET: 3.0000 | ORAL_TABLET | Freq: Two times a day (BID) | ORAL | 0 refills | Status: AC

## 2021-03-23 NOTE — ED Provider Notes (Signed)
Emergency Department Provider Note   I have reviewed the triage vital signs and the nursing notes.   HISTORY  Chief Complaint Multiple Complaints   HPI Angelica Kim is a 31 y.o. female with past medical history reviewed below presents to the emergency department with acute onset body aches with fever, congestion, cough.  She is having some central chest discomfort as well as pain into her left side and overall total body aches.  Symptoms began this evening.  No known sick contacts.  She has had COVID-19 once before but is not vaccinated.  She denies feeling particularly short of breath.  She is not having vomiting or diarrhea.  Tmax at home was 101 F.    Past Medical History:  Diagnosis Date   Ankylosing spondylitis (HCC)    Asthma    Fibromyalgia    History of pre-eclampsia    Kidney stones    Migraine    Ovarian cyst    Preterm labor     Review of Systems  Constitutional: Positive fever/chills and body aches.  Eyes: No visual changes. ENT: No sore throat. Positive congestion.  Cardiovascular: Positive chest pain. Respiratory: Denies shortness of breath. Positive cough.  Gastrointestinal: No abdominal pain.  No nausea, no vomiting.  No diarrhea.  No constipation. Genitourinary: Negative for dysuria. Musculoskeletal: Positive for back pain. Skin: Negative for rash. Neurological: Negative for weakness/numbness. Positive HA.   ____________________________________________   PHYSICAL EXAM:  VITAL SIGNS: ED Triage Vitals  Enc Vitals Group     BP 03/22/21 2254 121/75     Pulse Rate 03/22/21 2254 (!) 132     Resp 03/22/21 2254 18     Temp 03/22/21 2254 100.1 F (37.8 C)     Temp src --      SpO2 03/22/21 2254 98 %     Weight 03/22/21 2252 160 lb 0.9 oz (72.6 kg)     Height 03/22/21 2252 5' (1.524 m)   Constitutional: Alert and oriented. Well appearing and in no acute distress. Eyes: Conjunctivae are normal.  Head: Atraumatic. Nose: Positive  congestion/rhinnorhea. Mouth/Throat: Mucous membranes are moist.  Oropharynx non-erythematous. Neck: No stridor.   Cardiovascular: Tachycardia. Good peripheral circulation. Grossly normal heart sounds.   Respiratory: Normal respiratory effort.  No retractions. Lungs CTAB. Gastrointestinal: Soft and nontender. No distention.  Musculoskeletal: No lower extremity tenderness nor edema. No gross deformities of extremities. Neurologic:  Normal speech and language. No gross focal neurologic deficits are appreciated.  Skin:  Skin is warm, dry and intact. No rash noted.   ____________________________________________   LABS (all labs ordered are listed, but only abnormal results are displayed)  Labs Reviewed  RESP PANEL BY RT-PCR (FLU A&B, COVID) ARPGX2 - Abnormal; Notable for the following components:      Result Value   SARS Coronavirus 2 by RT PCR POSITIVE (*)    All other components within normal limits  BASIC METABOLIC PANEL - Abnormal; Notable for the following components:   CO2 21 (*)    Glucose, Bld 112 (*)    All other components within normal limits  CBC - Abnormal; Notable for the following components:   WBC 11.0 (*)    All other components within normal limits  PREGNANCY, URINE  URINALYSIS, ROUTINE W REFLEX MICROSCOPIC  TROPONIN I (HIGH SENSITIVITY)   ____________________________________________  EKG   EKG Interpretation  Date/Time:  Wednesday March 22 2021 22:51:38 EST Ventricular Rate:  132 PR Interval:    QRS Duration: 86 QT Interval:  402  QTC Calculation: 595 R Axis:   -4 Text Interpretation: Tachycardia Nonspecific T wave abnormality Abnormal ECG When compared with ECG of 23-Feb-2021 16:50, No significant change was found Confirmed by Alona Bene [id number]) on 03/22/2021 10:57:03 PM        ____________________________________________  RADIOLOGY  DG Chest 2 View  Result Date: 03/22/2021 CLINICAL DATA:  Chest pain EXAM: CHEST - 2 VIEW COMPARISON:   11/28/2020 FINDINGS: The heart size and mediastinal contours are within normal limits. Both lungs are clear. The visualized skeletal structures are unremarkable. IMPRESSION: No active cardiopulmonary disease. Electronically Signed   By: Jasmine Pang M.D.   On: 03/22/2021 23:10    ____________________________________________   PROCEDURES  Procedure(s) performed:   Procedures  None  ____________________________________________   INITIAL IMPRESSION / ASSESSMENT AND PLAN / ED COURSE  Pertinent labs & imaging results that were available during my care of the patient were reviewed by me and considered in my medical decision making (see chart for details).   This patient is Presenting for Evaluation of URI symptoms, which does require a range of treatment options, and is a complaint that involves a high risk of morbidity and mortality.  The Differential Diagnoses include CAP, COVID, Flu, PE, ACS.  Critical Interventions- Toradol and IVF   Medications  ketorolac (TORADOL) 30 MG/ML injection 30 mg (30 mg Intravenous Given 03/23/21 0337)  sodium chloride 0.9 % bolus 500 mL (500 mLs Intravenous New Bag/Given 03/23/21 0337)    Reassessment after intervention: HR improving and patient looking well.    I decided to review pertinent External Data, and in summary no recent ED visits for the same.   Clinical Laboratory Tests Ordered, included COVID PCR which is positive. Flu negative. Troponin is negative with > 6 hours of constant symptoms. No trending planned. Creatinine WNL with GFR > 60. Pregnancy negative. UA negative.   Radiologic Tests Ordered, included CXR. I independently interpreted the images and agree with radiology interpretation.   Cardiac Monitor Tracing which shows sinus tachycardia.   Social Determinants of Health Risk patient is a former smoker.   Medical Decision Making: Summary:  Patient presents to the emergency department with acute onset body aches with congestion  and fever.  Her body aches do include pain in the chest but is also having pain in the back and throughout her body.  This seems most consistent with COVID-19.  X-ray of the chest does not show any infiltrate or pneumothorax.  She has tested positive for COVID here.  Considered PE but feel this is less likely given her other associated infection symptoms and fairly recent symptom onset.  Borderline fever may be contributing to tachycardia.  Symptoms and vitals improved with pain management and IV fluid. Patient with asthma and elevated BMI. Discussed risk/benefit of Paxlovid. Plan for antiviral meds, quarantine, and supportive care at home. She has refills of home albuterol.   Disposition: discharge  ____________________________________________  FINAL CLINICAL IMPRESSION(S) / ED DIAGNOSES  Final diagnoses:  COVID-19     NEW OUTPATIENT MEDICATIONS STARTED DURING THIS VISIT:  New Prescriptions   NIRMATRELVIR/RITONAVIR EUA (PAXLOVID) 20 X 150 MG & 10 X 100MG  TABS    Take 3 tablets by mouth 2 (two) times daily for 5 days. Patient GFR is 60. Take nirmatrelvir (150 mg) two tablets twice daily for 5 days and ritonavir (100 mg) one tablet twice daily for 5 days.    Note:  This document was prepared using Dragon voice recognition software and may include unintentional dictation errors.  Alona Bene, MD, [facility] Emergency [facility]    Urias Sheek, Arlyss Repress, MD 03/23/21 [phone]

## 2021-03-23 NOTE — Discharge Instructions (Signed)
You were seen in the ED today with pain and fever. You have tested positive for COVID 19. I am starting you on antiviral medication. Please take as directed and follow with your PCP in the coming week. They may prefer a phone or telehealth visit.  If you develop any new or suddenly worsening symptoms, please return to the ED for re-evaluation.

## 2021-03-28 IMAGING — US US RENAL
2 series · 16 of 25 positions shown · non-contrast
Comparison: None.

CLINICAL DATA: Hematuria, kidney stone

EXAM:
RENAL / URINARY TRACT ULTRASOUND COMPLETE

[Series 1: us renal · 83 acquisitions, 15 frames shown (1 of 2)]
[im 1/83]
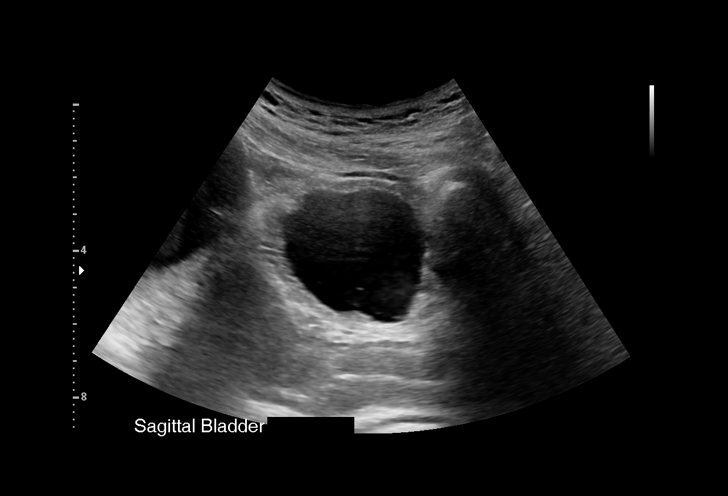
[im 8/83]
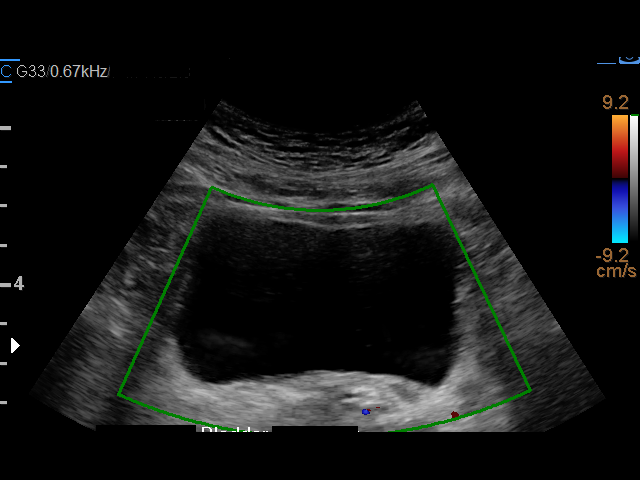
[im 11/83]
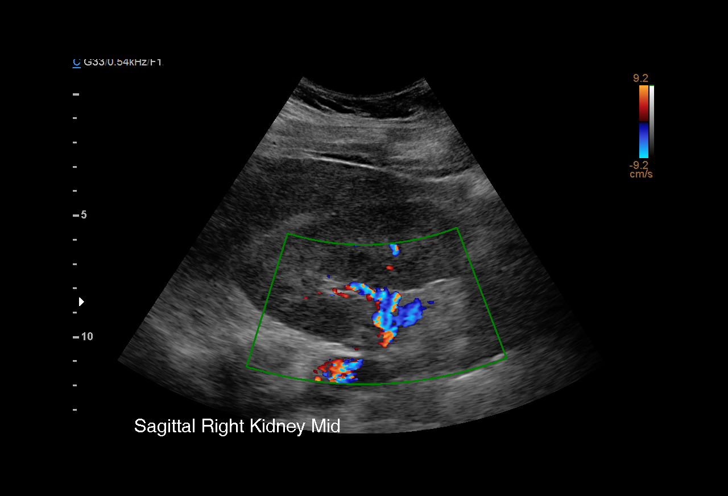
[im 18/83]
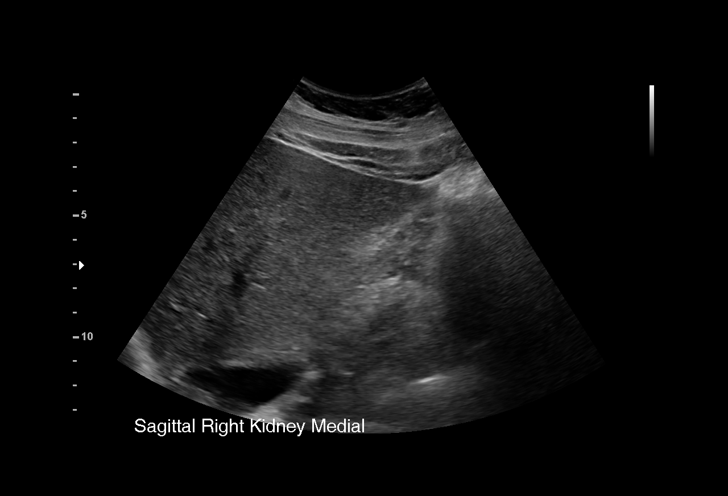
[im 25/83]
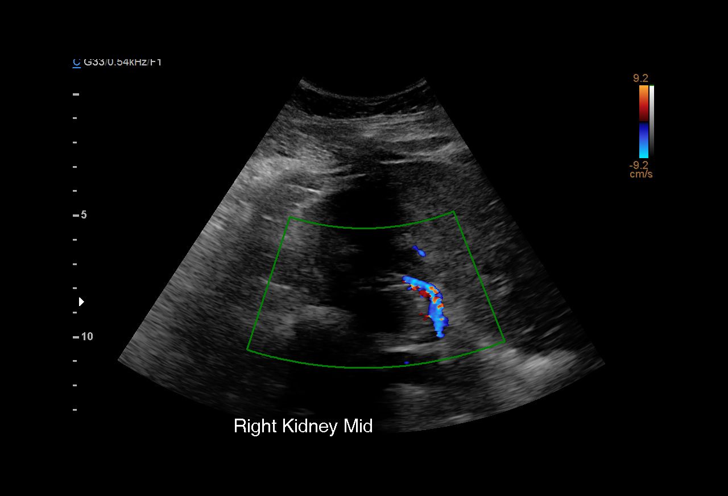
[im 29/83]
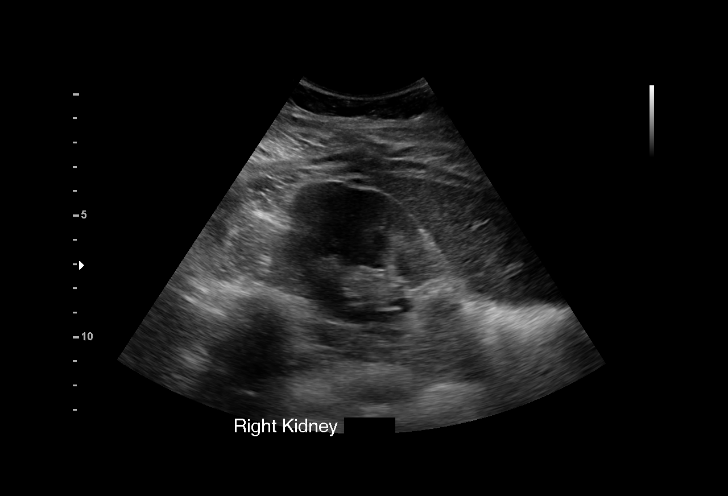
[im 36/83]
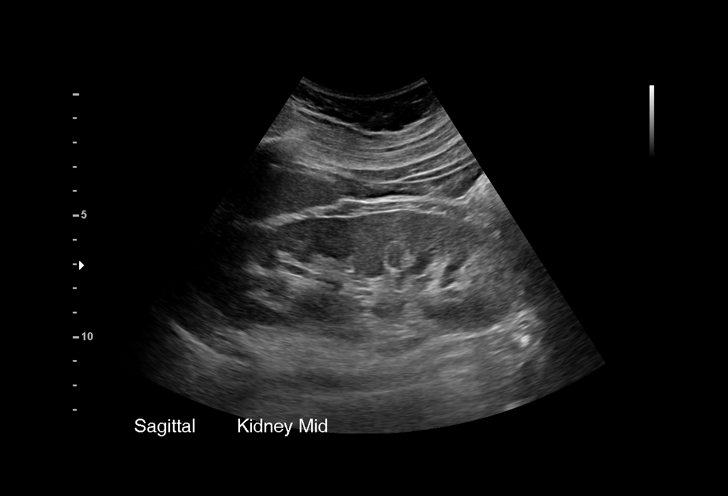
[im 40/83]
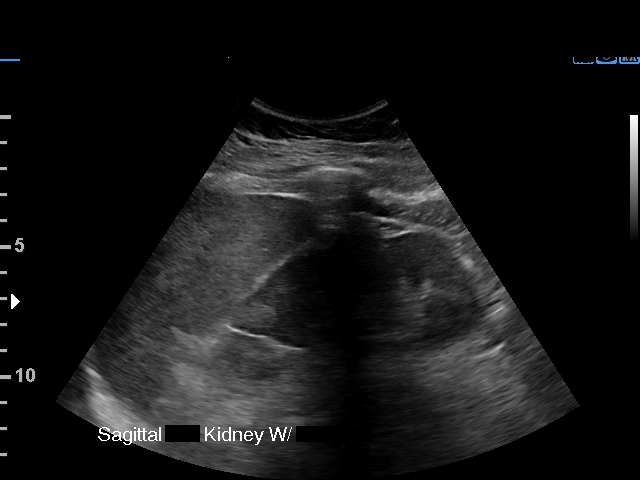
[im 47/83]
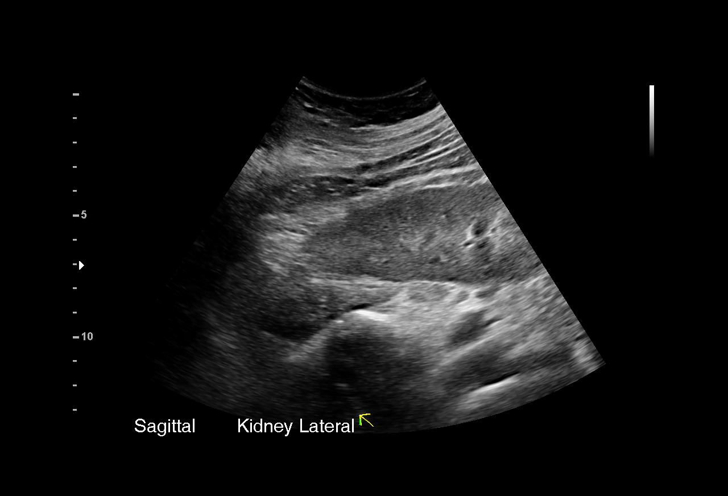
[im 50/83]
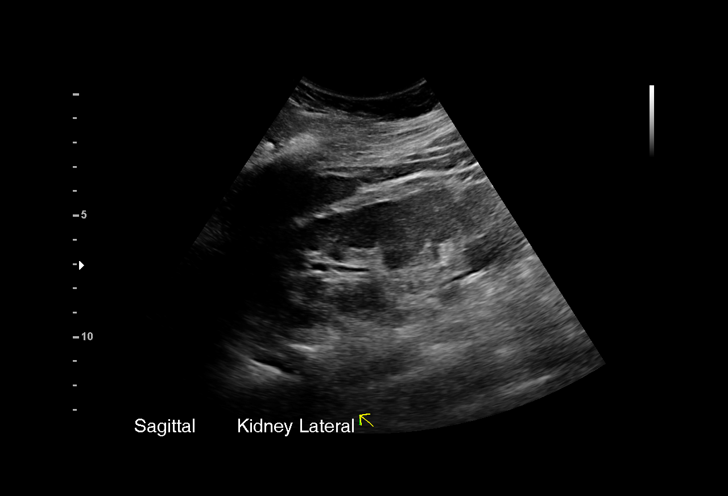
[im 58/83]
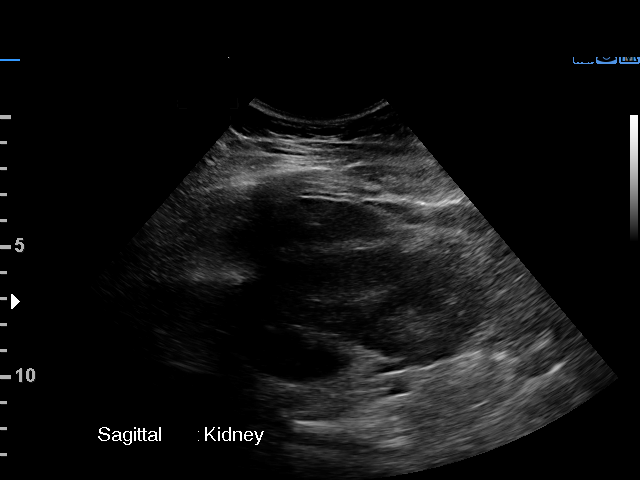
[im 61/83]
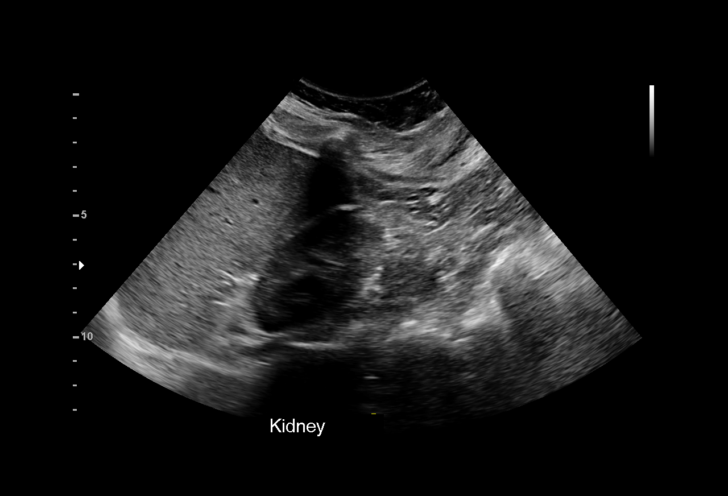
[im 68/83]
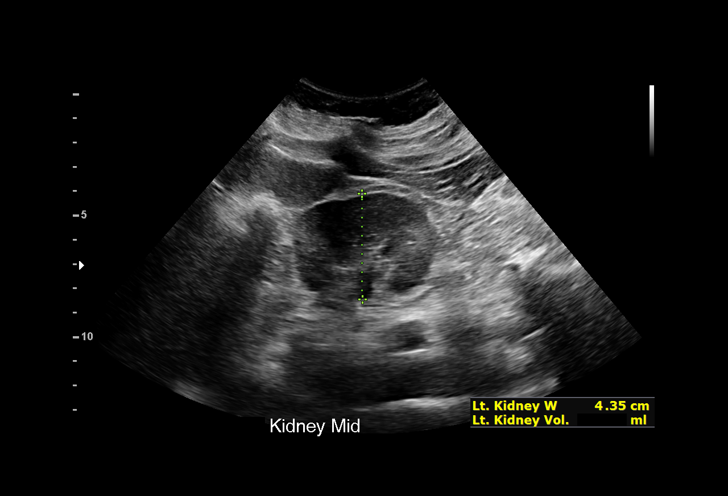
[im 75/83]
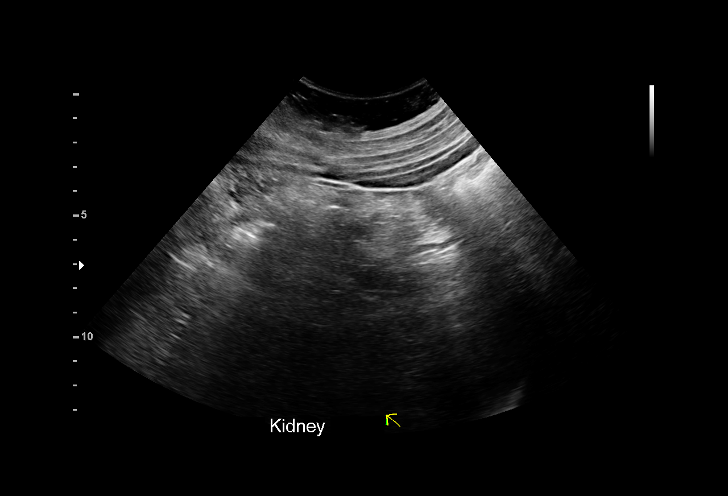
[im 79/83]
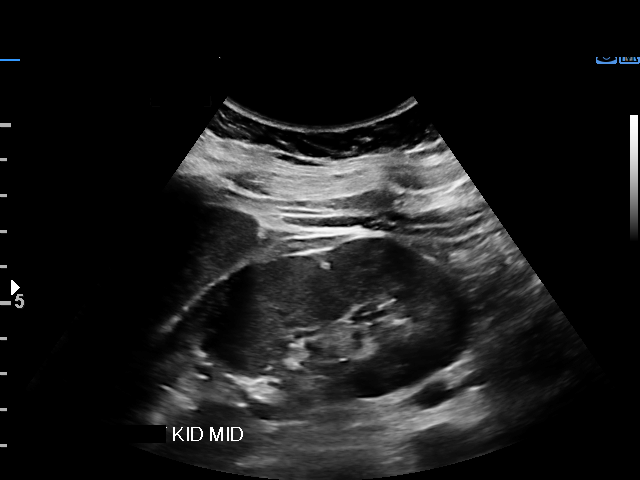

[Series 3: us renal · 1 of 2 slices shown (2 of 2)]
[im 1/2]
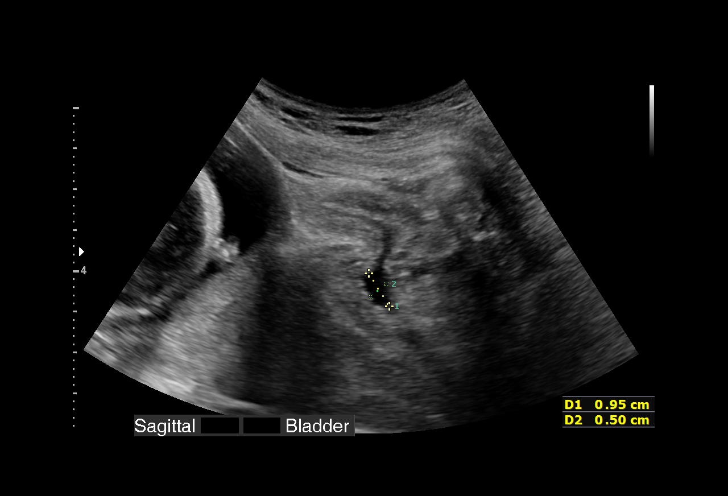

[16 of 25 positions shown; findings below may reference images not displayed]

FINDINGS: Right Kidney:

Renal measurements: 12.4 x 5.9 x 5.5 cm = volume: 209.2 mL.
Echogenicity within normal limits. No mass or hydronephrosis
visualized.

Left Kidney:

Renal measurements: 13 x 5.5 x 4.4 cm = volume: 162.2 mL.
Echogenicity within normal limits. Small 1.5 cm area measured by
technologist in the interpolar region demonstrates similar
echogenicity to adjacent parenchyma and probably reflects a
pseudolesion. Mild hydronephrosis.

Bladder:

Appears normal for degree of bladder distention. No significant
postvoid residual.

Other:

None.
IMPRESSION: Mild left hydronephrosis. Small area measured by technologist in the
interpolar region of left kidney probably reflects a pseudolesion.
Follow-up ultrasound post pregnancy suggested.

## 2021-05-17 LAB — OB RESULTS CONSOLE ABO/RH: RH Type: POSITIVE

## 2021-05-17 LAB — OB RESULTS CONSOLE RPR: RPR: NONREACTIVE

## 2021-05-17 LAB — OB RESULTS CONSOLE HIV ANTIBODY (ROUTINE TESTING): HIV: NONREACTIVE

## 2021-05-17 LAB — OB RESULTS CONSOLE ANTIBODY SCREEN: Antibody Screen: NEGATIVE

## 2021-05-17 LAB — OB RESULTS CONSOLE HEPATITIS B SURFACE ANTIGEN: Hepatitis B Surface Ag: NEGATIVE

## 2021-05-17 LAB — HEPATITIS C ANTIBODY: HCV Ab: NEGATIVE

## 2021-05-17 LAB — OB RESULTS CONSOLE RUBELLA ANTIBODY, IGM: Rubella: IMMUNE

## 2021-06-12 DIAGNOSIS — G8929 Other chronic pain: Secondary | ICD-10-CM | POA: Insufficient documentation

## 2021-06-20 LAB — OB RESULTS CONSOLE GC/CHLAMYDIA
Chlamydia: NEGATIVE
Neisseria Gonorrhea: NEGATIVE

## 2021-06-24 ENCOUNTER — Encounter: Payer: Self-pay | Admitting: Emergency Medicine

## 2021-06-24 ENCOUNTER — Ambulatory Visit
Admission: EM | Admit: 2021-06-24 | Discharge: 2021-06-24 | Disposition: A | Discharge status: Home / Self Care | Payer: Managed Care, Other (non HMO) | Attending: Family Medicine | Admitting: Family Medicine

## 2021-06-24 DIAGNOSIS — J4521 Mild intermittent asthma with (acute) exacerbation: Secondary | ICD-10-CM

## 2021-06-24 DIAGNOSIS — J069 Acute upper respiratory infection, unspecified: Secondary | ICD-10-CM

## 2021-06-24 MED ORDER — ALBUTEROL SULFATE HFA 108 (90 BASE) MCG/ACT IN AERS: 2.0000 | INHALATION_SPRAY | Freq: Four times a day (QID) | RESPIRATORY_TRACT | 0 refills | Status: DC | PRN

## 2021-06-24 MED ORDER — PREDNISONE 20 MG PO TABS: 40.0000 mg | ORAL_TABLET | Freq: Every day | ORAL | 0 refills | Status: AC

## 2021-06-24 NOTE — ED Provider Notes (Addendum)
[name]    CSN: [id number] Arrival date & time: 06/24/21  1440      History   Chief Complaint Chief Complaint  Patient presents with   Cough    Entered by patient    HPI Angelica HiresMaria Kim is a 31 y.o. female.    Cough Here for 2-day history of nasal congestion, cough, and a little bit of shortness of breath on exertion.  No fever.  No vomiting or diarrhea.  She does have a history of asthma.  She is [redacted] weeks pregnant  Past Medical History:  Diagnosis Date   Ankylosing spondylitis (HCC)    Asthma    Fibromyalgia    History of pre-eclampsia    Kidney stones    Migraine    Ovarian cyst    Preterm labor     Patient Active Problem List   Diagnosis Date Noted   Vacuum-assisted vaginal delivery (7/7) 08/04/2020   Gestational hypertension 08/03/2020   Allergy to amoxicillin 02/07/2020   History of premature delivery 02/07/2020   Generalized anxiety disorder 04/07/2014   Migraine 12/21/2013    Past Surgical History:  Procedure Laterality Date   COLONOSCOPY     ESOPHAGOGASTRODUODENOSCOPY ENDOSCOPY     spinal tap     WISDOM TOOTH EXTRACTION     WISDOM TOOTH EXTRACTION  2010    OB History     Gravida  4   Para  2   Term  0   Preterm  1   AB  0   Living  2      SAB  0   IAB  0   Ectopic  0   Multiple  0   Live Births  2            Home Medications    Prior to Admission medications   Medication Sig Start Date End Date Taking? Authorizing Provider  acetaminophen (TYLENOL) 325 MG tablet Take 2 tablets (650 mg total) by mouth every 4 (four) hours as needed (for pain scale < 4). 08/05/20  Yes Holshouser, Cala BradfordKimberly B, CNM  fluticasone (FLONASE) 50 MCG/ACT nasal spray Place 2 sprays into both nostrils daily. 02/23/21  Yes Theadora RamaMorgan, Lindsay Scales, PA-C  predniSONE (DELTASONE) 20 MG tablet Take 2 tablets (40 mg total) by mouth daily with breakfast for 5 days. 06/24/21 06/29/21 Yes Zenia ResidesBanister, Barnaby Rippeon K, MD  albuterol (PROVENTIL) (2.5 MG/3ML)  0.083% nebulizer solution Take 3 mLs (2.5 mg total) by nebulization every 6 (six) hours as needed for wheezing or shortness of breath. 02/23/21 03/25/21  Theadora RamaMorgan, Lindsay Scales, PA-C  albuterol (VENTOLIN HFA) 108 (90 Base) MCG/ACT inhaler Inhale 2 puffs into the lungs every 6 (six) hours as needed for wheezing or shortness of breath. 06/24/21   Zenia ResidesBanister, Rolla Servidio K, MD  amLODipine (NORVASC) 5 MG tablet Take 1 tablet (5 mg total) by mouth daily. 08/06/20   Holshouser, Gerhard MunchKimberly B, CNM  cetirizine (ZYRTEC ALLERGY) 10 MG tablet Take 1 tablet (10 mg total) by mouth at bedtime. 02/23/21 05/24/21  Theadora RamaMorgan, Lindsay Scales, PA-C  ferrous sulfate 325 (65 FE) MG tablet Take 1 tablet (325 mg total) by mouth every other day. 08/05/20   Holshouser, Gerhard MunchKimberly B, CNM  metoCLOPramide (REGLAN) 10 MG tablet Take 1 tablet (10 mg total) by mouth every 6 (six) hours. 03/22/17 07/31/18  [name], Coral ElseMia A, PA-C    Family History Family History  Problem Relation Age of Onset   Arthritis Mother    Hypertension Maternal Grandmother    Thyroid  disease Maternal Grandmother    Kidney Stones Maternal Grandfather     Social History Social History   Tobacco Use   Smoking status: Former    Types: Cigarettes    Quit date: 2014    Years since quitting: 9.4   Smokeless tobacco: Never  Vaping Use   Vaping Use: Never used  Substance Use Topics   Alcohol use: Not Currently   Drug use: No     Allergies   Amoxicillin and Zofran [ondansetron hcl]   Review of Systems Review of Systems  Respiratory:  Positive for cough.     Physical Exam Triage Vital Signs ED Triage Vitals  Enc Vitals Group     BP 06/24/21 1501 102/68     Pulse Rate 06/24/21 1501 99     Resp 06/24/21 1501 18     Temp 06/24/21 1501 98 F (36.7 C)     Temp Source 06/24/21 1501 Oral     SpO2 06/24/21 1501 97 %     Weight 06/24/21 1502 160 lb (72.6 kg)     Height 06/24/21 1502 5' (1.524 m)     Head Circumference --      Peak Flow --      Pain Score 06/24/21  1502 0     Pain Loc --      Pain Edu? --      Excl. in GC? --    No data found.  Updated Vital Signs BP 102/68 (BP Location: Right Arm)   Pulse 99   Temp 98 F (36.7 C) (Oral)   Resp 18   Ht 5' (1.524 m)   Wt 72.6 kg   LMP 03/02/2021 (Approximate)   SpO2 97%   Breastfeeding No   BMI 31.25 kg/m   Visual Acuity Right Eye Distance:   Left Eye Distance:   Bilateral Distance:    Right Eye Near:   Left Eye Near:    Bilateral Near:     Physical Exam Vitals reviewed.  Constitutional:      General: She is not in acute distress.    Appearance: She is not toxic-appearing.  HENT:     Right Ear: Tympanic membrane and ear canal normal.     Left Ear: Tympanic membrane and ear canal normal.     Nose: Nose normal.     Mouth/Throat:     Mouth: Mucous membranes are moist.     Pharynx: No oropharyngeal exudate or posterior oropharyngeal erythema.  Eyes:     Extraocular Movements: Extraocular movements intact.     Conjunctiva/sclera: Conjunctivae normal.     Pupils: Pupils are equal, round, and reactive to light.  Cardiovascular:     Rate and Rhythm: Normal rate and regular rhythm.     Heart sounds: No murmur heard. Pulmonary:     Effort: Pulmonary effort is normal. No respiratory distress.     Breath sounds: No stridor. No wheezing, rhonchi or rales.     Comments: Breath sounds are coarse Musculoskeletal:     Cervical back: Neck supple.  Lymphadenopathy:     Cervical: No cervical adenopathy.  Skin:    Capillary Refill: Capillary refill takes less than 2 seconds.     Coloration: Skin is not jaundiced or pale.  Neurological:     General: No focal deficit present.     Mental Status: She is alert and oriented to person, place, and time.  Psychiatric:        Behavior: Behavior normal.     UC  Treatments / Results  Labs (all labs ordered are listed, but only abnormal results are displayed) Labs Reviewed  NOVEL CORONAVIRUS, NAA    EKG   Radiology No results  found.  Procedures Procedures (including critical care time)  Medications Ordered in UC Medications - No data to display  Initial Impression / Assessment and Plan / UC Course  I have reviewed the triage vital signs and the nursing notes.  Pertinent labs & imaging results that were available during my care of the patient were reviewed by me and considered in my medical decision making (see chart for details).     We will swab for COVID, and she is at risk for severe disease.  If she is positive she should contact her OB provider to see their plan of treatment.  Also I will treat for asthma exacerbation Final Clinical Impressions(s) / UC Diagnoses   Final diagnoses:  Viral URI  Mild intermittent asthma with acute exacerbation     Discharge Instructions       You have been swabbed for COVID, and the test will result in the next 24 hours. Our staff will call you if positive. If the test is positive, you should quarantine for 5 days.   Prednisone 20 mg-'s 2 daily for 5 days-   Albuterol inhaler--do 2 puffs every 4 hours as needed for shortness of breath or wheezing      ED Prescriptions     Medication Sig Dispense Auth. Provider   albuterol (VENTOLIN HFA) 108 (90 Base) MCG/ACT inhaler Inhale 2 puffs into the lungs every 6 (six) hours as needed for wheezing or shortness of breath. 1 each Zenia Resides, MD   predniSONE (DELTASONE) 20 MG tablet Take 2 tablets (40 mg total) by mouth daily with breakfast for 5 days. 10 tablet Marlinda Mike Janace Aris, MD      PDMP not reviewed this encounter.   Zenia Resides, MD 06/24/21 1532    Zenia Resides, MD 06/24/21 [phone]

## 2021-06-24 NOTE — ED Triage Notes (Signed)
Patient c/o productive cough and congestion x 2 days.  Patient has taken Tylenol.

## 2021-06-24 NOTE — Discharge Instructions (Addendum)
  You have been swabbed for COVID, and the test will result in the next 24 hours. Our staff will call you if positive. If the test is positive, you should quarantine for 5 days.   Prednisone 20 mg-'s 2 daily for 5 days-   Albuterol inhaler--do 2 puffs every 4 hours as needed for shortness of breath or wheezing

## 2021-06-25 LAB — NOVEL CORONAVIRUS, NAA: SARS-CoV-2, NAA: NOT DETECTED

## 2021-06-27 ENCOUNTER — Other Ambulatory Visit: Payer: Self-pay

## 2021-06-27 ENCOUNTER — Inpatient Hospital Stay (HOSPITAL_COMMUNITY)
Admission: AD | Admit: 2021-06-27 | Discharge: 2021-06-27 | Disposition: A | Discharge status: Home / Self Care | Payer: Managed Care, Other (non HMO) | Attending: Emergency Medicine | Admitting: Emergency Medicine

## 2021-06-27 ENCOUNTER — Inpatient Hospital Stay (HOSPITAL_COMMUNITY): Payer: Managed Care, Other (non HMO)

## 2021-06-27 ENCOUNTER — Encounter (HOSPITAL_COMMUNITY): Payer: Self-pay | Admitting: Obstetrics & Gynecology

## 2021-06-27 DIAGNOSIS — O26891 Other specified pregnancy related conditions, first trimester: Secondary | ICD-10-CM | POA: Diagnosis not present

## 2021-06-27 DIAGNOSIS — R059 Cough, unspecified: Secondary | ICD-10-CM | POA: Insufficient documentation

## 2021-06-27 DIAGNOSIS — Z3A13 13 weeks gestation of pregnancy: Secondary | ICD-10-CM | POA: Diagnosis not present

## 2021-06-27 DIAGNOSIS — R0789 Other chest pain: Secondary | ICD-10-CM | POA: Insufficient documentation

## 2021-06-27 DIAGNOSIS — J45909 Unspecified asthma, uncomplicated: Secondary | ICD-10-CM | POA: Diagnosis not present

## 2021-06-27 LAB — BASIC METABOLIC PANEL
Anion gap: 9 (ref 5–15)
BUN: 7 mg/dL (ref 6–20)
CO2: 20 mmol/L — ABNORMAL LOW (ref 22–32)
Calcium: 8.8 mg/dL — ABNORMAL LOW (ref 8.9–10.3)
Chloride: 108 mmol/L (ref 98–111)
Creatinine, Ser: 0.62 mg/dL (ref 0.44–1.00)
GFR, Estimated: >60 mL/min (ref >60)
Glucose, Bld: 91 mg/dL (ref 70–99)
Potassium: 3.5 mmol/L (ref 3.5–5.1)
Sodium: 137 mmol/L (ref 135–145)

## 2021-06-27 LAB — CBC
HCT: 36.4 % (ref 36.0–46.0)
Hemoglobin: 12.1 g/dL (ref 12.0–15.0)
MCH: 28.9 pg (ref 26.0–34.0)
MCHC: 33.2 g/dL (ref 30.0–36.0)
MCV: 86.9 fL (ref 80.0–100.0)
Platelets: 231 10*3/uL (ref 150–400)
RBC: 4.19 MIL/uL (ref 3.87–5.11)
RDW: 14.1 % (ref 11.5–15.5)
WBC: 13.2 10*3/uL — ABNORMAL HIGH (ref 4.0–10.5)
nRBC: 0 % (ref 0.0–0.2)

## 2021-06-27 LAB — TROPONIN I (HIGH SENSITIVITY): Troponin I (High Sensitivity): 5 ng/L (ref <18)

## 2021-06-27 LAB — I-STAT BETA HCG BLOOD, ED (MC, WL, AP ONLY): I-stat hCG, quantitative: >2000 m[IU]/mL — ABNORMAL HIGH (ref <5)

## 2021-06-27 LAB — D-DIMER, QUANTITATIVE: D-Dimer, Quant: <0.27 ug/mL-FEU (ref 0.00–0.50)

## 2021-06-27 NOTE — ED Triage Notes (Addendum)
Pt. Sent here from [place]. Pt. Stated, I woke up around 300 with respiratory problems unable to breath fully. Chest pain was there , when I woke up I couldn't catch my breath and chest hurting.. I have asthma and my inhaler was not working. [facility] and now here for the chest pain.

## 2021-06-27 NOTE — [facility] Note (Addendum)
Angelica Kim is a 31 y.o. at 109w4d here in [facility] reporting: chest pain that began this morning @ 0300.  Reports chest pain is located in the center of her chest and hurts when taking a deep breath in.  States was seen @ UC on Friday for asthma exacerbation, was given rescue inhaler & steroids.  States has used rescue inhaler 3x & done nebulizer treatments this morning and no relief noted.  Also reporting pelvic pressure and cramping.  Denies VB or LOF.  Onset of complaint: today Pain score: 7/10 chest pain  Vitals:   06/27/21 0725  BP: 124/63  Pulse: 95  Resp: 18  Temp: 97.6 F (36.4 C)  SpO2: 99%     FHT: 158 bpm Lab orders placed from triage:   None

## 2021-06-27 NOTE — ED Provider Notes (Signed)
[facility]ERGENCY DEPARTMENT Provider Note   CSN: [id number] Arrival date & time: 06/27/21  5009     History  Chief Complaint  Patient presents with   Chest Pain   Pelvic Pressure   Cramping   Chest Pain    Angelica Kim is a 31 y.o. female presenting [facility] with complaint of chest pain and difficulty breathing.  Pt reports she woke up around 0400 this morning with a right sided pleuritic chest pain, also reports she has had persistent cough for several days, states it feels like "air is trapped and cannot get it out".  She says she has not had this sensation before, including on prior ED visits.  Hx of asthma and trying inhaler at home with little relief.  Went to [facility] as she is [redacted] weeks gestation, and referred here for further workup.  Treated for asthma exacerbation on 06/24/21 in ED with prednisone x 5 days.  Seen in Ed for CP and SOB on 1/26 and 2/23 - diagnosed with Covid `19 on 03/23/21 in Ed.  She denies any personal history of DVT or PE, any unilateral leg swelling or tenderness, any new hormone use or surgeries.  She does report a history of DVT in her mother, uncertain whether it was provoked or not.   HPI     Home Medications Prior to Admission medications   Medication Sig Start Date End Date Taking? Authorizing Provider  acetaminophen (TYLENOL) 325 MG tablet Take 2 tablets (650 mg total) by mouth every 4 (four) hours as needed (for pain scale < 4). 08/05/20   Holshouser, Cala Bradford B, CNM  albuterol (PROVENTIL) (2.5 MG/3ML) 0.083% nebulizer solution Take 3 mLs (2.5 mg total) by nebulization every 6 (six) hours as needed for wheezing or shortness of breath. 02/23/21 03/25/21  Theadora Rama Scales, PA-C  albuterol (VENTOLIN HFA) 108 (90 Base) MCG/ACT inhaler Inhale 2 puffs into the lungs every 6 (six) hours as needed for wheezing or shortness of breath. 06/24/21   Zenia Resides, MD  amLODipine (NORVASC) 5 MG tablet Take 1  tablet (5 mg total) by mouth daily. 08/06/20   Holshouser, [name], CNM  cetirizine (ZYRTEC ALLERGY) 10 MG tablet Take 1 tablet (10 mg total) by mouth at bedtime. 02/23/21 05/24/21  Theadora Rama Scales, PA-C  ferrous sulfate 325 (65 FE) MG tablet Take 1 tablet (325 mg total) by mouth every other day. 08/05/20   Holshouser, [name], CNM  fluticasone (FLONASE) 50 MCG/ACT nasal spray Place 2 sprays into both nostrils daily. 02/23/21   Theadora Rama Scales, PA-C  predniSONE (DELTASONE) 20 MG tablet Take 2 tablets (40 mg total) by mouth daily with breakfast for 5 days. 06/24/21 06/29/21  Zenia Resides, MD  metoCLOPramide (REGLAN) 10 MG tablet Take 1 tablet (10 mg total) by mouth every 6 (six) hours. 03/22/17 07/31/18  [name], Mia A, PA-C      Allergies    Amoxicillin and Zofran [ondansetron hcl]    Review of Systems   Review of Systems  Physical Exam Updated Vital Signs BP 124/79 (BP Location: Right Arm)   Pulse 93   Temp 98.1 F (36.7 C) (Oral)   Resp 17   Ht 5' (1.524 m)   Wt 75.8 kg   LMP 03/02/2021 (Approximate)   SpO2 99%   BMI 32.61 kg/m  Physical Exam Constitutional:      General: She is not in acute distress. HENT:     Head: Normocephalic and  atraumatic.  Eyes:     Conjunctiva/sclera: Conjunctivae normal.     Pupils: Pupils are equal, round, and reactive to light.  Cardiovascular:     Rate and Rhythm: Normal rate and regular rhythm.  Pulmonary:     Effort: Pulmonary effort is normal. No respiratory distress.  Abdominal:     General: There is no distension.     Tenderness: There is no abdominal tenderness.  Skin:    General: Skin is warm and dry.  Neurological:     General: No focal deficit present.     Mental Status: She is alert. Mental status is at baseline.  Psychiatric:        Mood and Affect: Mood normal.        Behavior: Behavior normal.    ED Results / Procedures / Treatments   Labs (all labs ordered are listed, but only abnormal results are  displayed) Labs Reviewed  BASIC METABOLIC PANEL - Abnormal; Notable for the following components:      Result Value   CO2 20 (*)    Calcium 8.8 (*)    All other components within normal limits  CBC - Abnormal; Notable for the following components:   WBC 13.2 (*)    All other components within normal limits  I-STAT BETA HCG BLOOD, ED ([facility], [facility], [facility] ONLY) - Abnormal; Notable for the following components:   I-stat hCG, quantitative >2,000.0 (*)    All other components within normal limits  D-DIMER, QUANTITATIVE  TROPONIN I (HIGH SENSITIVITY)    EKG EKG Interpretation  Date/Time:  Tuesday Jun 27 2021 08:01:33 EDT Ventricular Rate:  89 PR Interval:  146 QRS Duration: 86 QT Interval:  354 QTC Calculation: 430 R Axis:   5 Text Interpretation: Normal sinus rhythm with sinus arrhythmia Normal ECG When compared with ECG of 22-Mar-2021 22:51, PREVIOUS ECG IS PRESENT Confirmed by Alvester Chou [id number]) on 06/27/2021 8:31:57 AM  Radiology DG Chest 2 View  Result Date: 06/27/2021 CLINICAL DATA:  31 year old female who woke with shortness of breath at 0300 hours, chest pain. EXAM: CHEST - 2 VIEW COMPARISON:  03/22/2021 chest radiographs and earlier. FINDINGS: PA and lateral views. Lung volumes and mediastinal contours remain normal. Visualized tracheal air column is within normal limits. Lung markings are stable from last year. No pneumothorax, pleural effusion, or acute pulmonary opacity. Negative visible bowel gas and osseous structures. IMPRESSION: Negative.  No acute cardiopulmonary abnormality. Electronically Signed   By: Odessa Fleming M.D.   On: 06/27/2021 08:44    Procedures Procedures    Medications Ordered in ED Medications - No data to display  ED Course/ Medical Decision Making/ A&P Clinical Course as of 06/27/21 1319  Tue Jun 27, 2021  1017 Work-up is negative, she remains comfortable appearing, no hypoxia.  I recommended she complete the prednisone course, hold off on further  albuterol now unless she is wheezing, and f/u with outpt clinic. [MT]  1125 Patient advised to follow-up again with her gynecologist if continues to have abdominal cramping, explained that if she begins to pass blood clots this may be an issue with a miscarriage, which unfortunately is common in early pregnancy.  She verbalized understanding [MT]    Clinical Course User Index [MT] Brittlyn Cloe, Kermit Balo, MD                           Medical Decision Making Amount and/or Complexity of Data Reviewed Labs: ordered.   This patient presents  to the Emergency Department with complaint of chest pain. This involves an extensive number of treatment options, and is a complaint that carries with it a high risk of complications and morbidity, given the patient's comorbidity, including pregnancy.The differential diagnosis includes ACS vs Pneumothorax vs Reflux/Gastritis vs MSK pain vs Pneumonia vs other.  I do not hear any wheezing on exam, her lungs sound clear.  I have a low suspicion at this point for asthma exacerbation.  She has been taking her steroid medications prescribed from her prior visit, and has albuterol at home, but I do not believe she needs addition albuterol treatments at this time.  Cannot exclude the possibility of pulmonary embolism.  Overall she would be low risk for PE with no active symptoms of DVT, however given the description of her symptoms and no clear secondary cause for this feeling of "air trapping" I have ordered a D-dimer test.  I discussed the work-up with the patient quitting possible CT scan and she is in agreement with proceeding at this point.  D-dimer subsequently returned negative  I ordered, reviewed, and interpreted labs.  Pertinent results include no significant finding I ordered imaging studies which included x-ray of the I independently visualized and interpreted imaging which showed no focal infiltrate or evidence of pneumothorax, and the monitor tracing which showed  normal sinus rhythm. I agree with the radiologist interpretation External records obtained and reviewed showing [facility] evaluation I personally reviewed the patients ECG which showed sinus rhythm with no acute ischemic findings  After the interventions stated above, I reevaluated the patient and found that they were symptomatically unchanged, same mild chest discomfort, no new hypoxia or vital sign derangements.  Stable for discharge  Based on the patient's clinical exam, vital signs, risk factors, and ED testing, I felt that the patient's overall risk of life-threatening emergency such as ACS, PE, sepsis, or infection was low.  At this time, I felt the patient's presentation was most clinically consistent with nonspecific chest pain, but explained to the patient that this evaluation was not a definitive diagnostic workup.  I discussed outpatient follow up with primary care provider, and provided specialist office number on the patient's discharge paper if a referral was deemed necessary.  Return precautions were discussed with the patient.  I felt the patient was clinically stable for discharge.         Final Clinical Impression(s) / ED Diagnoses Final diagnoses:  Cough, unspecified type  Chest discomfort    Rx / DC Orders ED Discharge Orders     None         Terald Sleeperrifan, Stockton Nunley J, MD 06/27/21 1319

## 2021-06-27 NOTE — Progress Notes (Signed)
Report called to Maralyn Sago, ED Charge RN.

## 2021-06-27 NOTE — ED Notes (Signed)
All discharge instructions reviewed with patient and patient verbalized understanding of same. Patient stable and ambulatory at time of discharge.  ?

## 2021-06-27 NOTE — [facility]U Provider Note (Signed)
None     S Ms. Angelica Kim is a 31 y.o. [phone] patient who presents to MAU today with complaint of chest pain.  She states the pain started at 0300 this morning and worsens with deep breath.  She endorses a history of asthma with inhaler usage this morning as well as 3 albuterol treatments.  She states "it feels like a constriction pain." Patient also endorses some pelvic pressure and abdominal cramping.   O BP 124/63 (BP Location: Right Arm)   Pulse 95   Temp 97.6 F (36.4 C) (Oral)   Resp 18   Ht 5' (1.524 m)   Wt 75.9 kg   LMP 03/02/2021 (Approximate)   SpO2 99%   BMI 32.67 kg/m  Physical Exam Constitutional:      Appearance: Normal appearance.  HENT:     Head: Normocephalic and atraumatic.  Eyes:     Conjunctiva/sclera: Conjunctivae normal.  Cardiovascular:     Rate and Rhythm: Normal rate.  Pulmonary:     Effort: Pulmonary effort is normal. No respiratory distress.  Musculoskeletal:        General: Normal range of motion.     Cervical back: Normal range of motion.  Neurological:     Mental Status: She is alert.  Psychiatric:        Mood and Affect: Mood normal.        Behavior: Behavior normal.    A Medical screening exam complete Chest Pain Pelvic Pressure Abdominal Cramping  P -Informed that chest pain complaint is of high priority, but would need to be evaluated in [facility]. -Patient reports some hesitance citing wait times and informed that provider does not know current wait times or acuity, but that chest pain would be of priority. -Discussed need to return to [facility] for complaint of pelvic pressure and abdominal cramping. -[facility] provider Robie Ridge contacted and informed of patient complaint. Agreeable to transfer.  -Charge nurse, Doreatha Massed, updated on POC.    Gerrit Heck, CNM 06/27/2021 7:32 AM

## 2021-10-30 ENCOUNTER — Ambulatory Visit
Admission: EM | Admit: 2021-10-30 | Discharge: 2021-10-30 | Disposition: A | Discharge status: Home / Self Care | Payer: Managed Care, Other (non HMO) | Attending: Physician Assistant | Admitting: Physician Assistant

## 2021-10-30 DIAGNOSIS — R051 Acute cough: Secondary | ICD-10-CM | POA: Diagnosis present

## 2021-10-30 DIAGNOSIS — G9331 Postviral fatigue syndrome: Secondary | ICD-10-CM | POA: Insufficient documentation

## 2021-10-30 DIAGNOSIS — J069 Acute upper respiratory infection, unspecified: Secondary | ICD-10-CM | POA: Insufficient documentation

## 2021-10-30 DIAGNOSIS — Z1152 Encounter for screening for COVID-19: Secondary | ICD-10-CM | POA: Insufficient documentation

## 2021-10-30 LAB — RESP PANEL BY RT-PCR (FLU A&B, COVID) ARPGX2
Influenza A by PCR: NEGATIVE
Influenza B by PCR: NEGATIVE
SARS Coronavirus 2 by RT PCR: POSITIVE — AB

## 2021-10-30 IMAGING — US US PELVIS COMPLETE TRANSABD/TRANSVAG W DUPLEX
1 series · 13 of 25 positions shown · non-contrast
Comparison: Prenatal ultrasound August 02, 2020

CLINICAL DATA: Pelvic pain for 1 week.  3 months postpartum.

EXAM:
TRANSABDOMINAL AND TRANSVAGINAL ULTRASOUND OF PELVIS
DOPPLER ULTRASOUND OF OVARIES
TECHNIQUE: Both transabdominal and transvaginal ultrasound examinations of the
pelvis were performed. Transabdominal technique was performed for
global imaging of the pelvis including uterus, ovaries, adnexal
regions, and pelvic cul-de-sac.
It was necessary to proceed with endovaginal exam following the
transabdominal exam to visualize the adnexa. Color and duplex
Doppler ultrasound was utilized to evaluate blood flow to the
ovaries.

[Series 1: us pelvic complete w transvaginal and torsion righ · 13 of 111 slices shown]
[im 1/111]
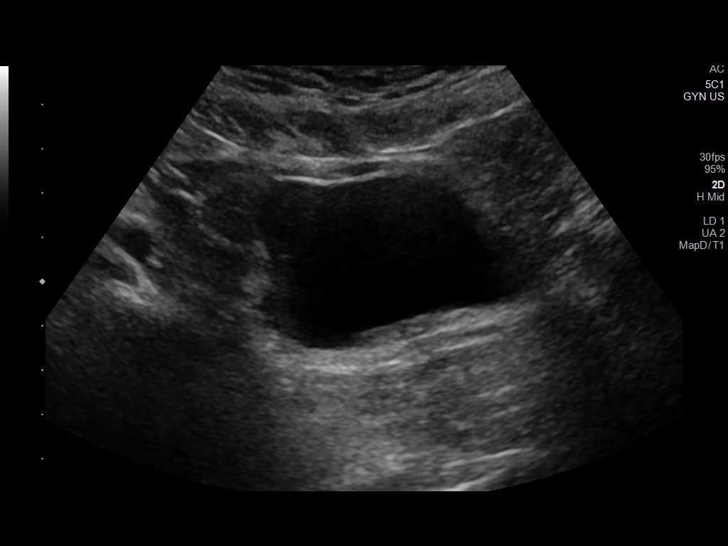
[im 10/111]
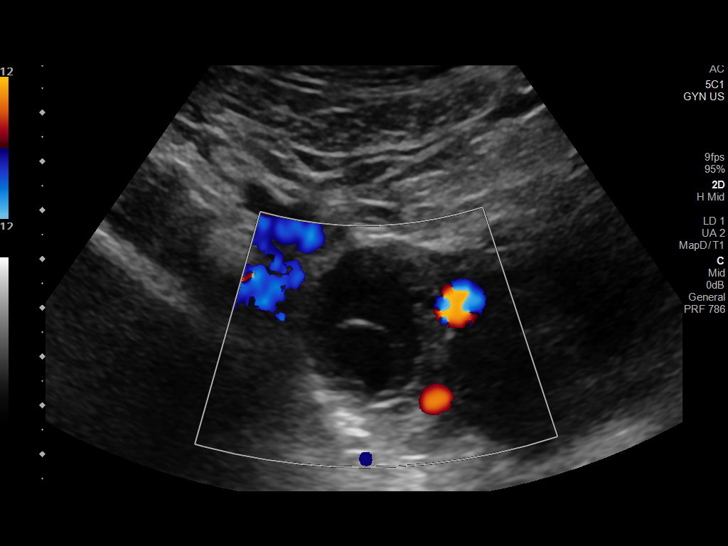
[im 19/111]
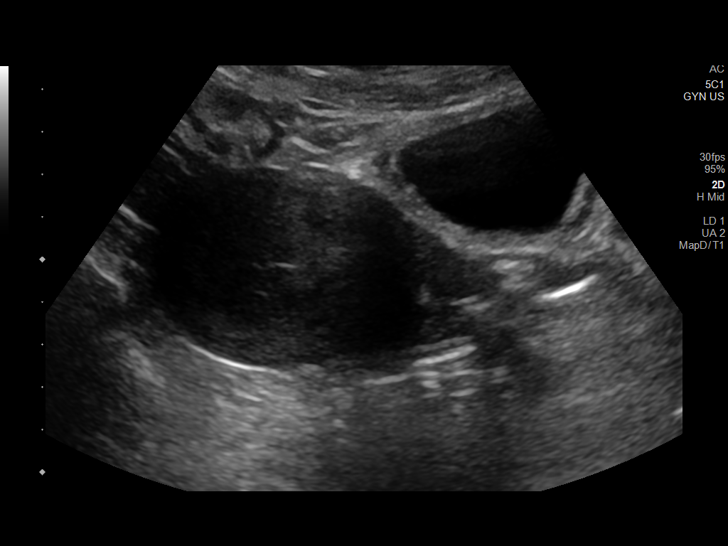
[im 28/111]
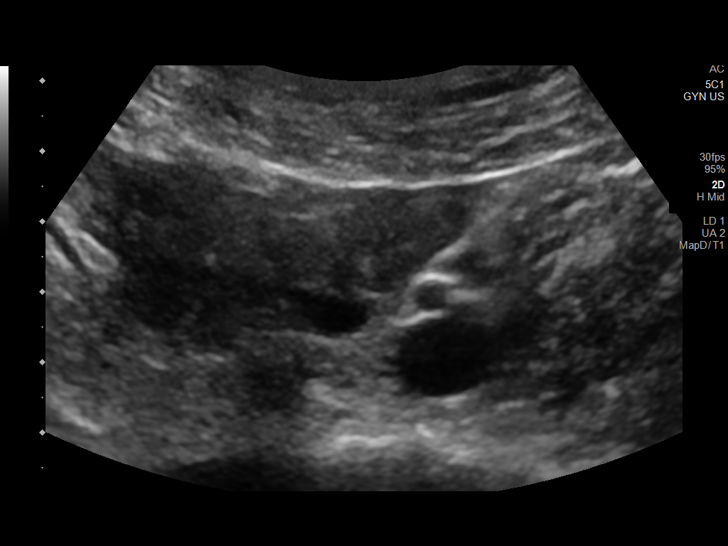
[im 37/111]
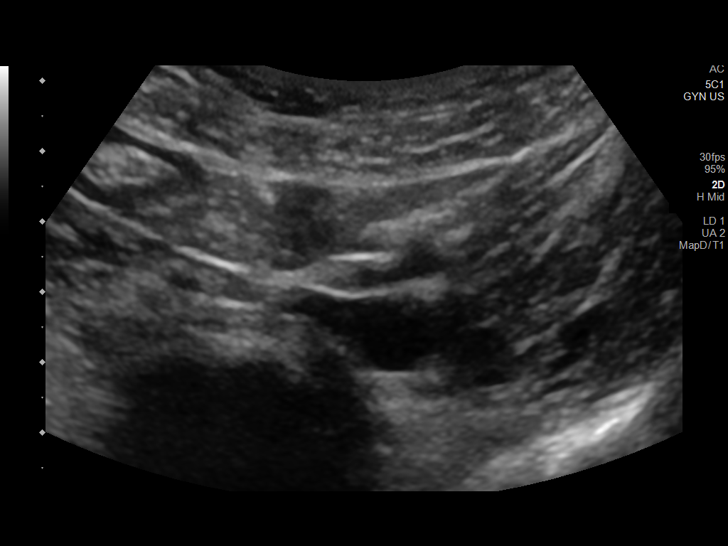
[im 46/111]
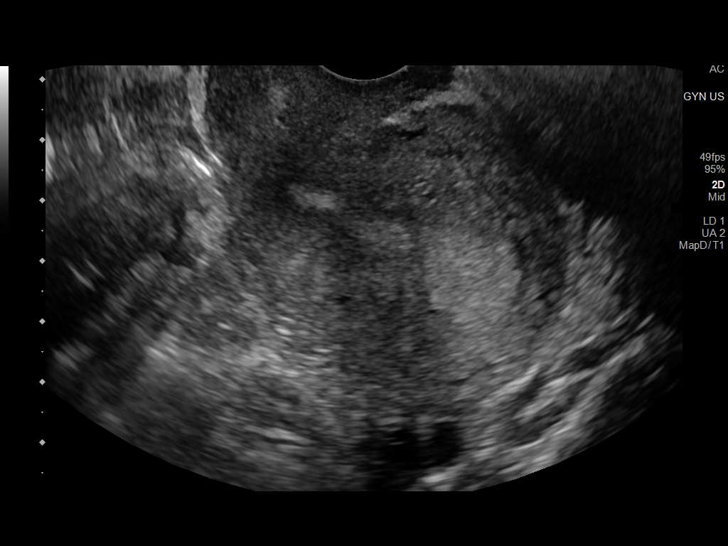
[im 56/111]
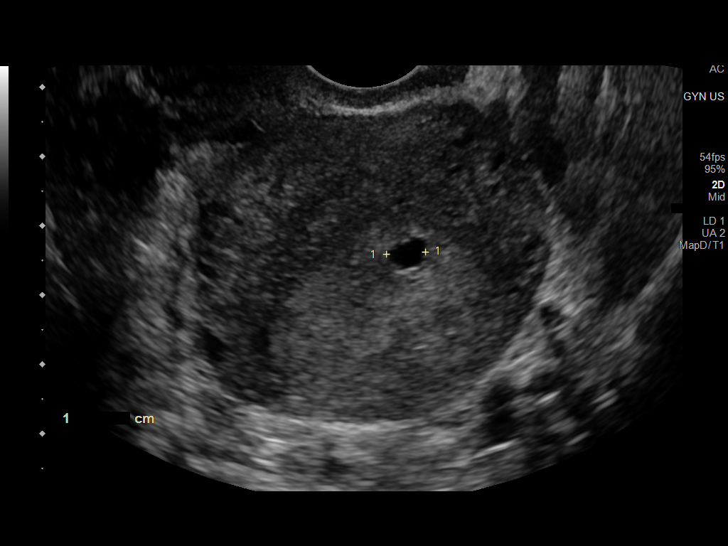
[im 65/111]
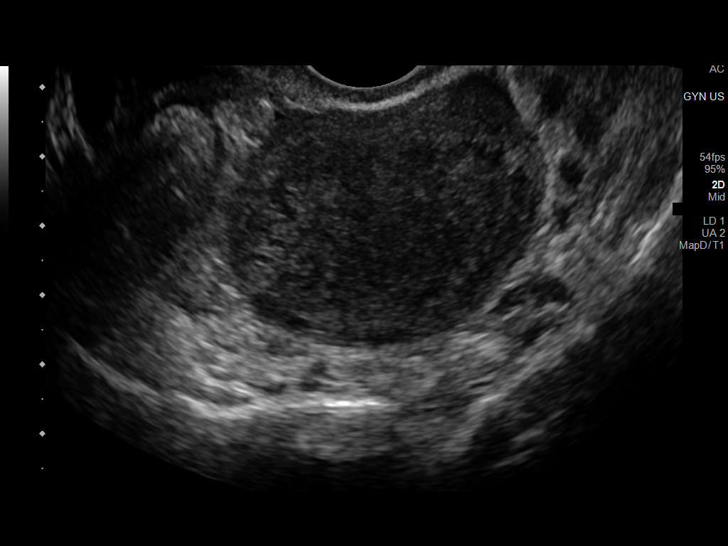
[im 74/111]
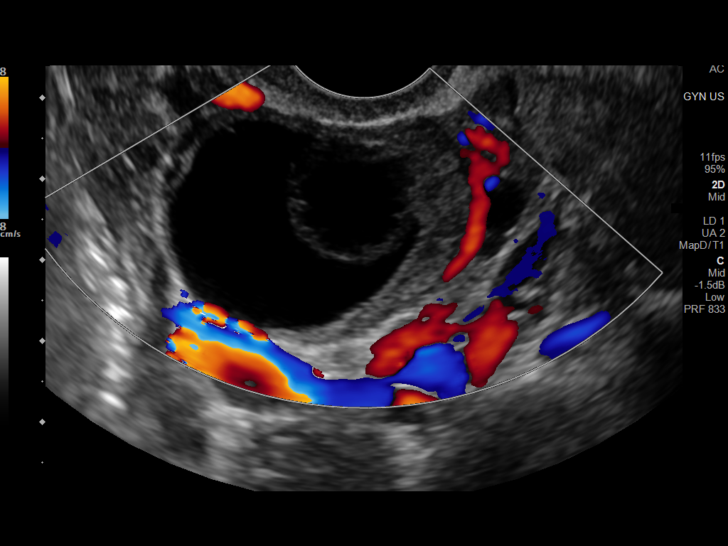
[im 83/111]
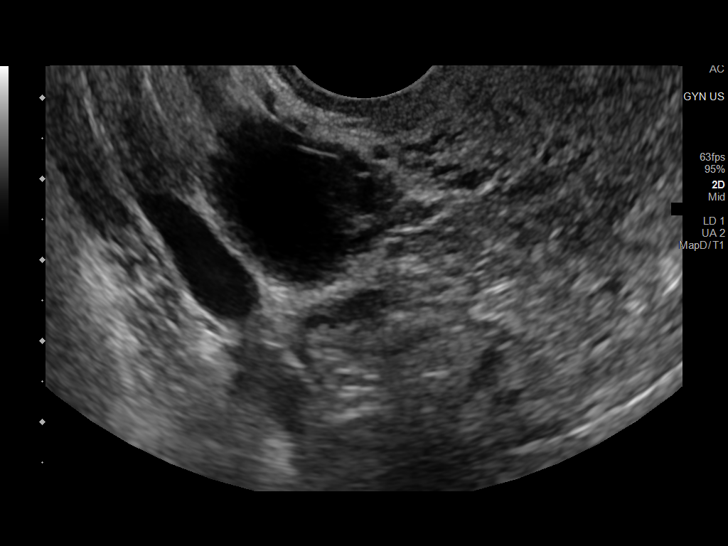
[im 92/111]
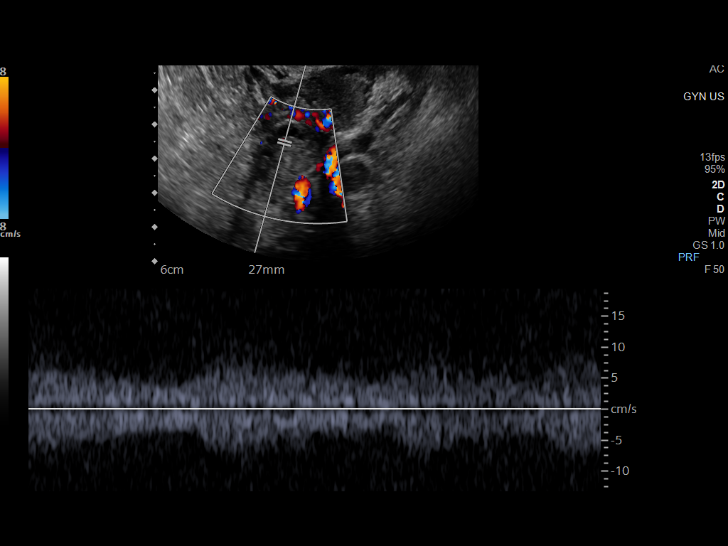
[im 101/111]
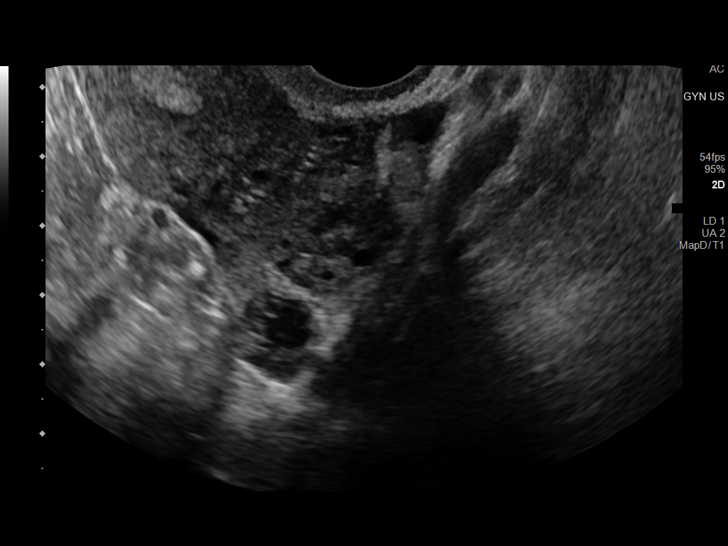
[im 111/111]
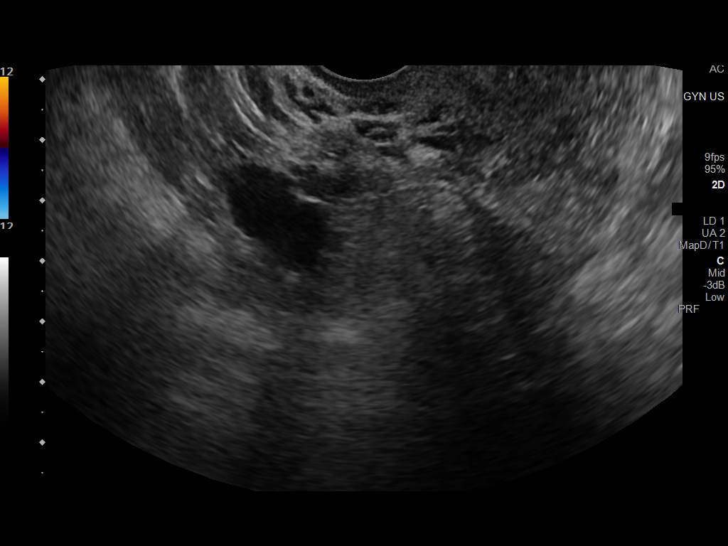

[13 of 25 positions shown; findings below may reference images not displayed]

FINDINGS: Uterus

Measurements: 7.3 x 5.0 x 5.5 cm = volume: 11.4 mL. No fibroids or
other mass visualized.

Endometrium

Thickness: 13.6 mm. There is a circumscribed anechoic structure
within the endometrium measuring 0.5 x 0.4 x 0.6 cm.

Right ovary

Measurements: 4.0 x 3.0 x 3.4 cm = volume: 19.6 mL. On the right
ovary there is a complex cystic structure measuring 3.0 x 2.6 x
cm.

Left ovary

Measurements: 3.2 x 1.5 x 2.0 cm = volume: 4.8 mL. Normal
appearance/no adnexal mass.

Pulsed Doppler evaluation of both ovaries demonstrates normal
low-resistance arterial and venous waveforms.

Other findings

No abnormal free fluid.
IMPRESSION: 1. Thickened endometrium with a 6 mm anechoic structure eccentric
within the uterine fundus. These may represent physiologic findings
in a postpartum patient who has not resumed her menstrual periods.
However very early intrauterine pregnancy cannot be excluded.
2. Right ovarian cyst demonstrating the daughter cyst sign, usually
associated with an appearance of a stimulated follicle. However,
given the thickened endometrium, short-term imaging follow-up is
recommended, to exclude the remote possibility of ectopic pregnancy.
3. Normal appearance of the left ovary.
4. No free fluid in the pelvis.

## 2021-10-30 IMAGING — CT CT RENAL STONE PROTOCOL
2 of 4 series · 16 of 46 positions shown, 18 images · non-contrast
Comparison: CT the abdomen and pelvis 08/04/2018.

CLINICAL DATA: 30-year-old female with history of left-sided flank
pain. Suspected kidney stone.

EXAM:
CT ABDOMEN AND PELVIS WITHOUT CONTRAST
TECHNIQUE: Multidetector CT imaging of the abdomen and pelvis was performed
following the standard protocol without IV contrast.

[Series 3: renal stone 5.0 · axial · 0.79mm/px · z∈[-749,-319]mm · 13 of 94 slices shown, 15 images]
[im 4/94  soft-tissue]
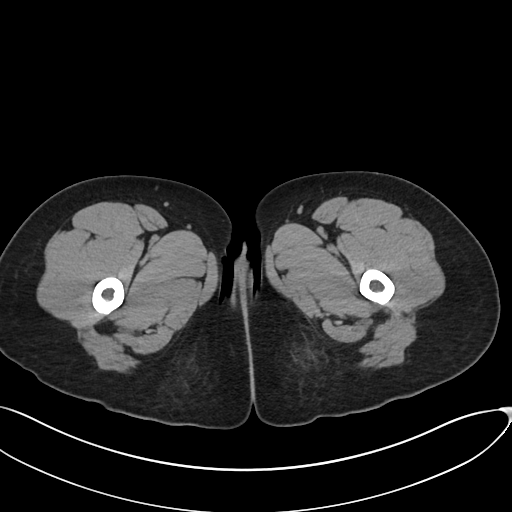
[im 4/94  bone]
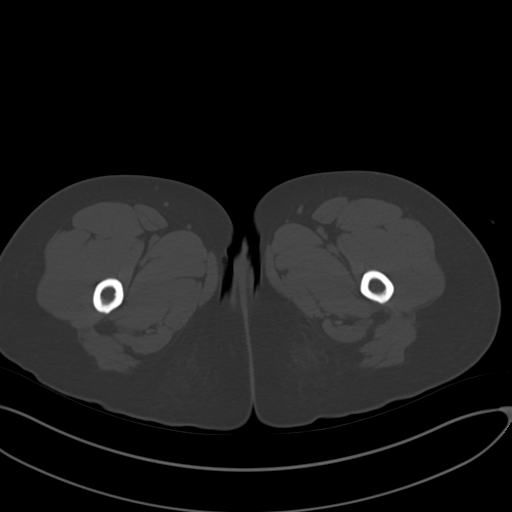
[im 12/94  soft-tissue]
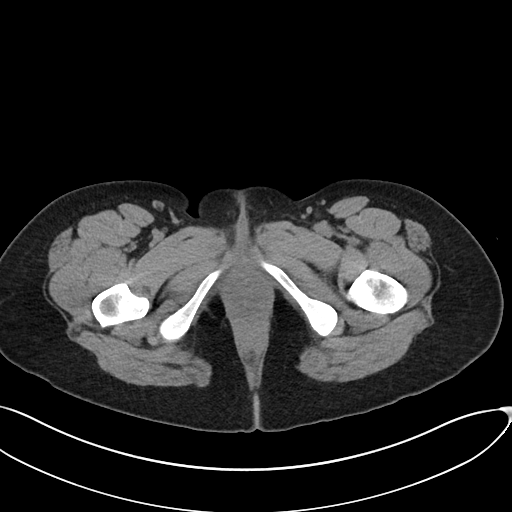
[im 19/94  soft-tissue]
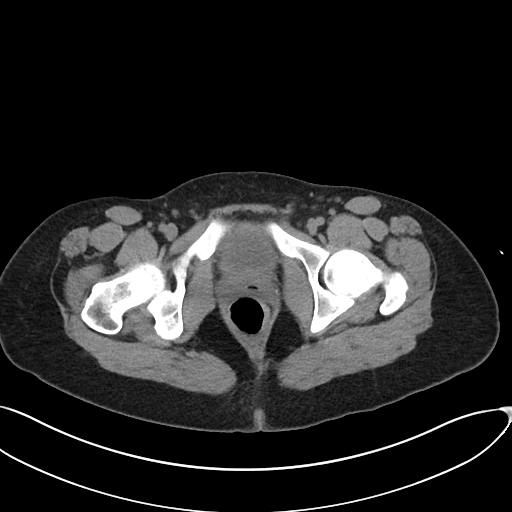
[im 27/94  soft-tissue]
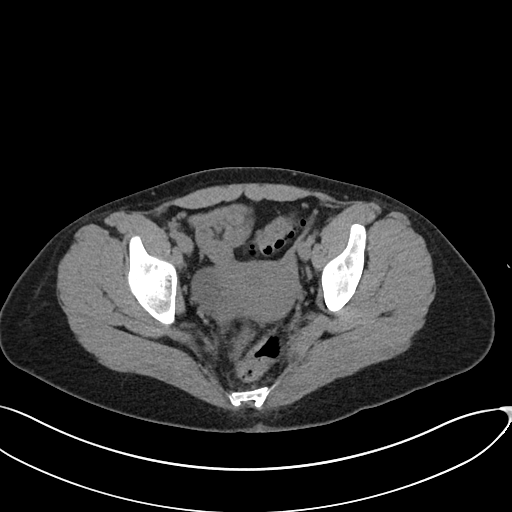
[im 34/94  soft-tissue]
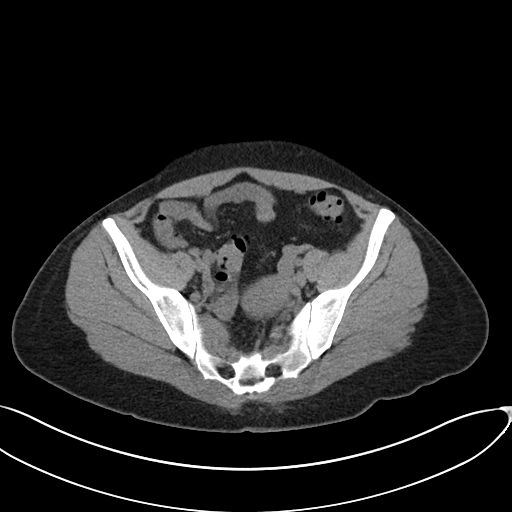
[im 41/94  soft-tissue]
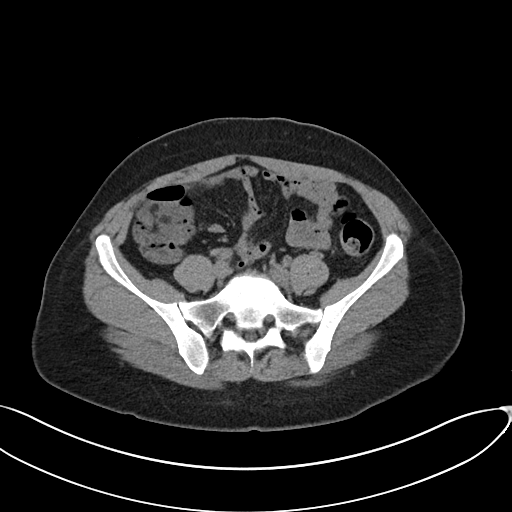
[im 49/94  soft-tissue]
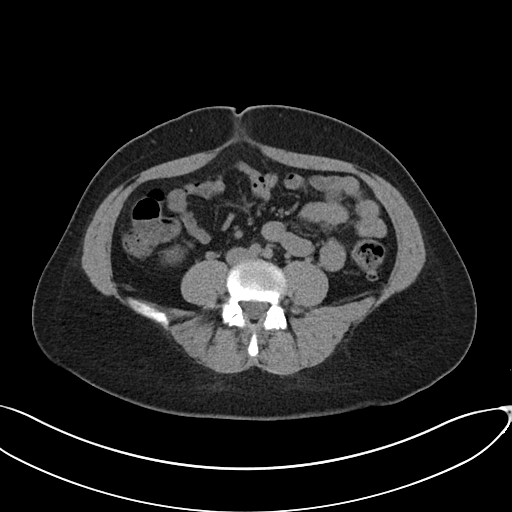
[im 53/94  soft-tissue]
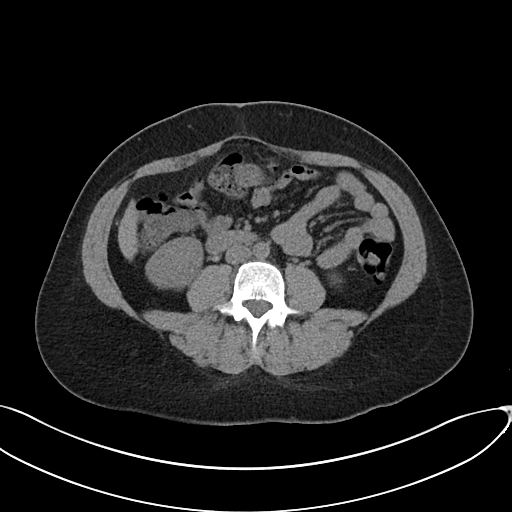
[im 60/94  soft-tissue]
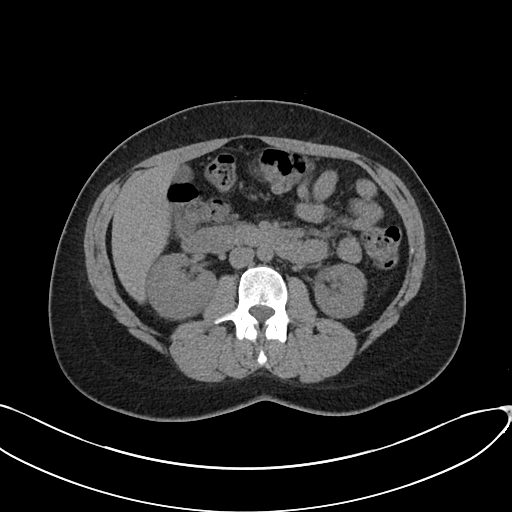
[im 60/94  bone]
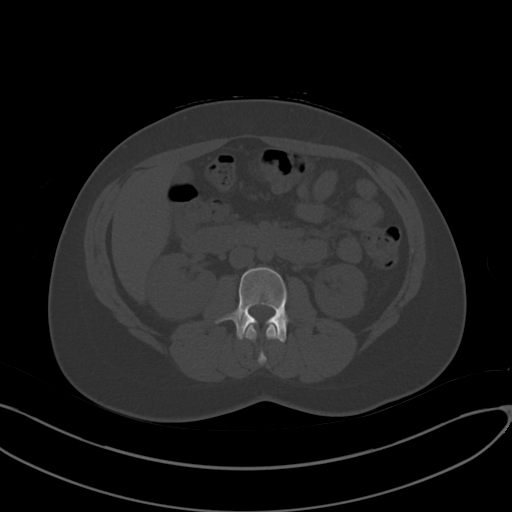
[im 67/94  soft-tissue]
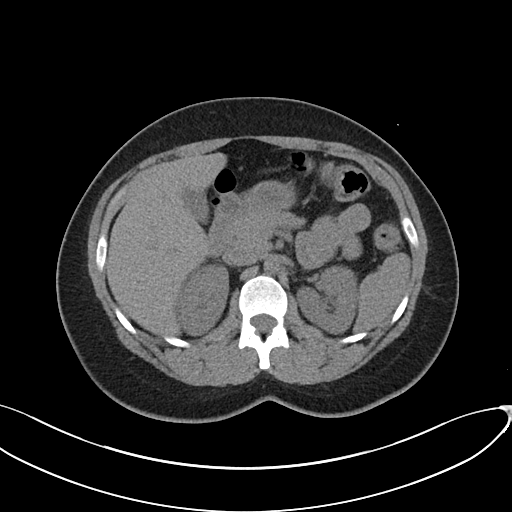
[im 75/94  soft-tissue]
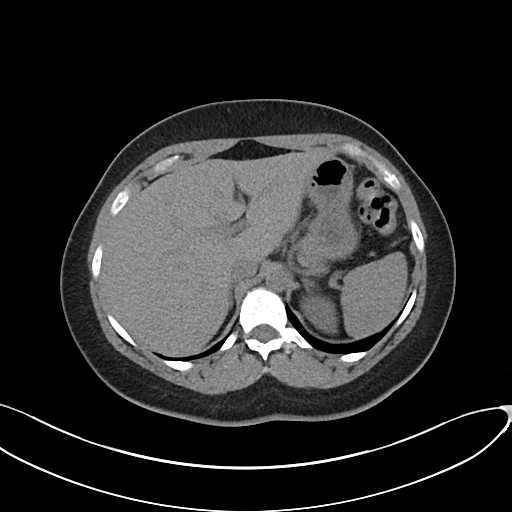
[im 82/94  soft-tissue]
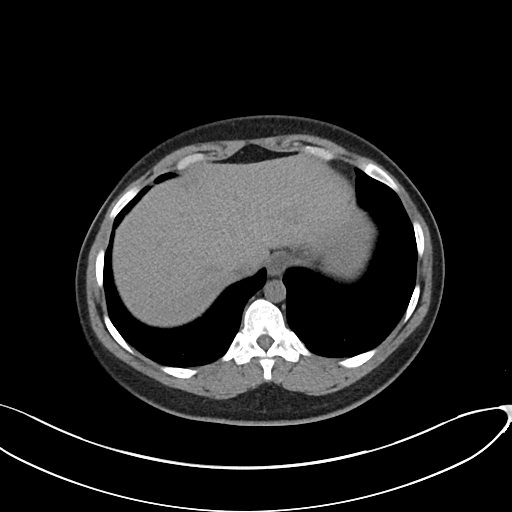
[im 90/94  soft-tissue]
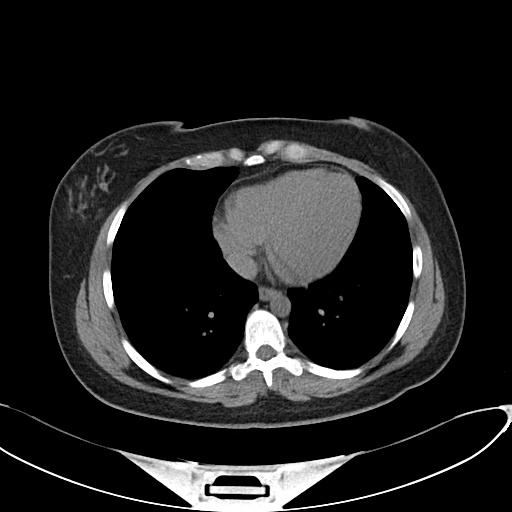

[Series 6: cor · coronal · 0.76mm/px · 3 of 151 slices shown]
[im 51/151  soft-tissue]
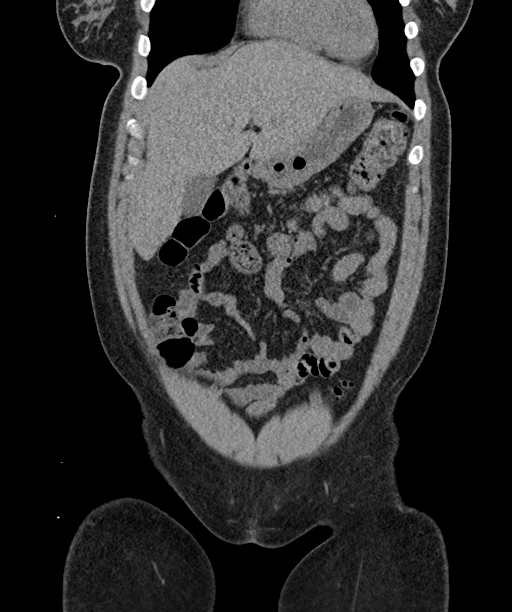
[im 67/151  soft-tissue]
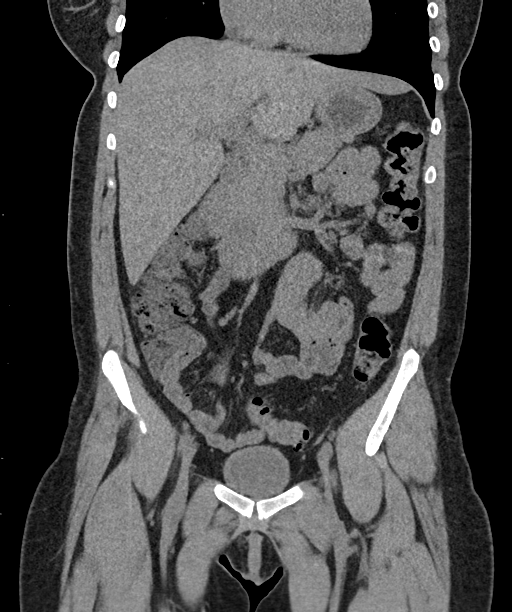
[im 84/151  soft-tissue]
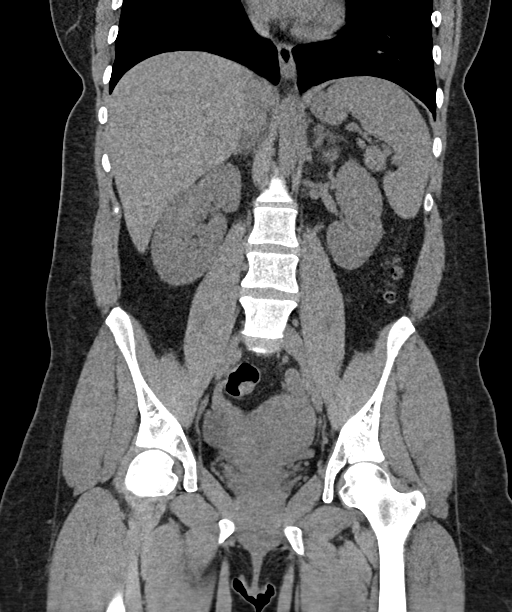

[16 of 46 positions shown; findings below may reference images not displayed]

FINDINGS: Lower chest: Unremarkable.

Hepatobiliary: No definite suspicious cystic or solid hepatic
lesions are noted on today's noncontrast CT examination. Unenhanced
appearance of the gallbladder is normal.

Pancreas: No definite pancreatic mass or peripancreatic fluid
collections or inflammatory changes are noted on today's noncontrast
CT examination.

Spleen: Unremarkable.

Adrenals/Urinary Tract: There are no abnormal calcifications within
the collecting system of either kidney, along the course of either
ureter, or within the lumen of the urinary bladder. No
hydroureteronephrosis or perinephric stranding to suggest urinary
tract obstruction at this time. The unenhanced appearance of the
kidneys is unremarkable bilaterally. Unenhanced appearance of the
urinary bladder is normal. Bilateral adrenal glands are normal in
appearance.

Stomach/Bowel: Unenhanced appearance of the stomach is normal. No
pathologic dilatation of small bowel or colon. Numerous colonic
diverticulae are noted, particularly in the descending colon and
sigmoid colon, without surrounding inflammatory changes to suggest
an acute diverticulitis at this time. Normal appendix.

Vascular/Lymphatic: No atherosclerotic calcifications are noted in
the abdominal aorta or pelvic vasculature. No lymphadenopathy noted
in the abdomen or pelvis.

Reproductive: Uterus and left ovary are unremarkable in appearance.
Well-defined 3.4 x 2.4 x 2.8 cm low-attenuation lesion in the right
adnexa, likely a small ovarian cyst.

Other: No significant volume of ascites.  No pneumoperitoneum.

Musculoskeletal: There are no aggressive appearing lytic or blastic
lesions noted in the visualized portions of the skeleton.
IMPRESSION: 1. No acute findings are noted in the abdomen or pelvis to account
for the patient's symptoms. Specifically, no urinary tract calculi
no findings of urinary tract obstruction are noted at this time.
2. 3.4 x 2.4 x 2.8 cm low-attenuation lesion in the right adnexal
region, likely a small ovarian cyst.

## 2021-10-30 MED ORDER — ALBUTEROL SULFATE HFA 108 (90 BASE) MCG/ACT IN AERS: 2.0000 | INHALATION_SPRAY | Freq: Four times a day (QID) | RESPIRATORY_TRACT | 0 refills | Status: AC | PRN

## 2021-10-30 MED ORDER — DM-GUAIFENESIN ER 30-600 MG PO TB12: 1.0000 | ORAL_TABLET | Freq: Two times a day (BID) | ORAL | 0 refills | Status: DC

## 2021-10-30 NOTE — ED Triage Notes (Signed)
Pt c/o malaise, fatigue, since ~ Thursday. Associated nasal congestion and drainage, cough, sneezing.

## 2021-10-30 NOTE — ED Provider Notes (Signed)
[name]    CSN: [id number] Arrival date & time: 10/30/21  5573      History   Chief Complaint Chief Complaint  Patient presents with   Fatigue    HPI Angelica Kim is a 31 y.o. female.   31 year old female presents with fatigue, cough and congestion with shortness of breath.  Patient relates that she is [redacted] weeks pregnant.  She indicates that for the past several days she has been having increasing upper respiratory congestion with sinus congestion, postnasal drip, rhinitis that is clear.  She also indicates that she has been having intermittent sore throat and painful swallowing.  She has been also having some chest congestion with cough, and production has been clear.  She does indicates that she has been having some shortness of breath without wheezing and she feels mild pain when she breathes in on the left upper chest wall.  She indicates she has not have any fever, or chills.  But she has been having some body aches, fatigue and decreased energy.  She relates she has not been taking any OTC medicines for her symptoms due to being pregnant.  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  She relates that she is did a COVID test this morning OTC and it was negative.  She is tolerating fluids well and denies any wheezing.     Past Medical History:  Diagnosis Date   Ankylosing spondylitis ([place])    Asthma    Fibromyalgia    History of pre-eclampsia    Kidney stones    Migraine    Ovarian cyst    Preterm labor     Patient Active Problem List   Diagnosis Date Noted   Vacuum-assisted vaginal delivery (7/7) 08/04/2020   Gestational hypertension 08/03/2020   Allergy to amoxicillin 02/07/2020   History of premature delivery 02/07/2020   Generalized anxiety disorder 04/07/2014   Migraine 12/21/2013    Past Surgical History:  Procedure Laterality Date   COLONOSCOPY     ESOPHAGOGASTRODUODENOSCOPY ENDOSCOPY     spinal tap     WISDOM TOOTH  EXTRACTION     WISDOM TOOTH EXTRACTION  2010    OB History     Gravida  4   Para  2   Term  0   Preterm  1   AB  0   Living  2      SAB  0   IAB  0   Ectopic  0   Multiple  0   Live Births  2            Home Medications    Prior to Admission medications   Medication Sig Start Date End Date Taking? Authorizing Provider  dextromethorphan-guaiFENesin (MUCINEX DM) 30-600 MG 12hr tablet Take 1 tablet by mouth 2 (two) times daily. 10/30/21  Yes Nyoka Lint, PA-C  acetaminophen (TYLENOL) 325 MG tablet Take 2 tablets (650 mg total) by mouth every 4 (four) hours as needed (for pain scale < 4). 08/05/20   Holshouser, Theone Murdoch, CNM  albuterol (PROVENTIL) (2.5 MG/3ML) 0.083% nebulizer solution Take 3 mLs (2.5 mg total) by nebulization every 6 (six) hours as needed for wheezing or shortness of breath. 02/23/21 03/25/21  Lynden Oxford Scales, PA-C  albuterol (VENTOLIN HFA) 108 (90 Base) MCG/ACT inhaler Inhale 2 puffs into the lungs every 6 (six) hours as needed for wheezing or shortness of breath. 10/30/21   Nyoka Lint, PA-C  amLODipine (NORVASC) 5  MG tablet Take 1 tablet (5 mg total) by mouth daily. 08/06/20   Holshouser, [name], CNM  cetirizine (ZYRTEC ALLERGY) 10 MG tablet Take 1 tablet (10 mg total) by mouth at bedtime. 02/23/21 05/24/21  Theadora Rama Scales, PA-C  ferrous sulfate 325 (65 FE) MG tablet Take 1 tablet (325 mg total) by mouth every other day. 08/05/20   Holshouser, [name], CNM  fluticasone (FLONASE) 50 MCG/ACT nasal spray Place 2 sprays into both nostrils daily. 02/23/21   Theadora Rama Scales, PA-C  metoCLOPramide (REGLAN) 10 MG tablet Take 1 tablet (10 mg total) by mouth every 6 (six) hours. 03/22/17 07/31/18  [name], Coral Else, PA-C    Family History Family History  Problem Relation Age of Onset   Arthritis Mother    Hypertension Maternal Grandmother    Thyroid disease Maternal Grandmother    Kidney Stones Maternal Grandfather     Social  History Social History   Tobacco Use   Smoking status: Former    Types: Cigarettes    Quit date: 2014    Years since quitting: 9.7   Smokeless tobacco: Never  Vaping Use   Vaping Use: Never used  Substance Use Topics   Alcohol use: Not Currently   Drug use: No     Allergies   Amoxicillin and Zofran [ondansetron hcl]   Review of Systems Review of Systems  Constitutional:  Positive for fatigue.  HENT:  Positive for postnasal drip, rhinorrhea and sinus pressure.   Respiratory:  Positive for cough and shortness of breath.      Physical Exam Triage Vital Signs ED Triage Vitals [10/30/21 0835]  Enc Vitals Group     BP 119/83     Pulse Rate 95     Resp 16     Temp 98 F (36.7 C)     Temp Source Oral     SpO2 98 %     Weight      Height      Head Circumference      Peak Flow      Pain Score 5     Pain Loc      Pain Edu?      Excl. in GC?    No data found.  Updated Vital Signs BP 119/83 (BP Location: Right Arm)   Pulse 95   Temp 98 F (36.7 C) (Oral)   Resp 16   LMP 03/02/2021 (Approximate)   SpO2 98%   Breastfeeding No   Visual Acuity Right Eye Distance:   Left Eye Distance:   Bilateral Distance:    Right Eye Near:   Left Eye Near:    Bilateral Near:     Physical Exam Constitutional:      Appearance: Normal appearance.  HENT:     Right Ear: Ear canal normal. Tympanic membrane is injected.     Left Ear: Ear canal normal. Tympanic membrane is injected.     Mouth/Throat:     Mouth: Mucous membranes are moist.     Pharynx: Oropharynx is clear. Posterior oropharyngeal erythema present. No oropharyngeal exudate.  Cardiovascular:     Rate and Rhythm: Normal rate and regular rhythm.     Heart sounds: Normal heart sounds.  Pulmonary:     Effort: Pulmonary effort is normal.     Breath sounds: Normal air entry. Examination of the left-upper field reveals rhonchi. Rhonchi (mild) present. No wheezing or rales.  Lymphadenopathy:     Cervical: No  cervical adenopathy.  Neurological:  Mental Status: She is alert.      UC Treatments / Results  Labs (all labs ordered are listed, but only abnormal results are displayed) Labs Reviewed  RESP PANEL BY RT-PCR (FLU A&B, COVID) ARPGX2  CBC WITH DIFFERENTIAL/PLATELET    EKG   Radiology No results found.  Procedures Procedures (including critical care time)  Medications Ordered in UC Medications - No data to display  Initial Impression / Assessment and Plan / UC Course  I have reviewed the triage vital signs and the nursing notes.  Pertinent labs & imaging results that were available during my care of the patient were reviewed by me and considered in my medical decision making (see chart for details).    Plan: 1.  The acute upper respiratory infection and cough will be treated with the following: A.  COVID and flu test are pending due to cough, respiratory symptoms, fatigue. B.  She has been advised to take Mucinex DM every 12 hours to treat the chest congestion and cough. 2.  The acute cough will be treated with the following: A.  Albuterol inhaler, 2 puffs every 6-8 hours as needed for cough and shortness of breath. 3.  CBC is pending due to fatigue and upper respiratory symptoms. 4.  Patient advised to follow-up with PCP or return to urgent care if symptoms fail to improve. Final Clinical Impressions(s) / UC Diagnoses   Final diagnoses:  Acute cough  Acute upper respiratory infection  Encounter for screening for COVID-19  Postviral fatigue syndrome     Discharge Instructions      COVID and flu test will be completed in 24 hours or less.  If you do not get a call from this office within 24 hours in case these test are negative.  You can go on MyChart to review the test results when they post in 24 hours or less. The complete blood count will be completed in 24 hours or less.  If you do not get a call from the office that means that these results are within a  normal range and that your respiratory infection is more of a viral origin. Advised to use the albuterol solution, 2 puffs every 6 hours on a regular basis as needed for the feeling of shortness of breath. Advise use Mucinex DM every 12 hours to help with the cough and chest congestion. Advised to follow-up with your PCP or return to urgent care if symptoms fail to improve.    ED Prescriptions     Medication Sig Dispense Auth. Provider   albuterol (VENTOLIN HFA) 108 (90 Base) MCG/ACT inhaler Inhale 2 puffs into the lungs every 6 (six) hours as needed for wheezing or shortness of breath. 1 each Ellsworth Lennox, PA-C   dextromethorphan-guaiFENesin [facility] DM) 30-600 MG 12hr tablet Take 1 tablet by mouth 2 (two) times daily. 14 tablet Ellsworth Lennox, PA-C      PDMP not reviewed this encounter.   Ellsworth Lennox, PA-C 10/30/21 [phone]

## 2021-10-30 NOTE — Discharge Instructions (Addendum)
COVID and flu test will be completed in 24 hours or less.  If you do not get a call from this office within 24 hours in case these test are negative.  You can go on MyChart to review the test results when they post in 24 hours or less. The complete blood count will be completed in 24 hours or less.  If you do not get a call from the office that means that these results are within a normal range and that your respiratory infection is more of a viral origin. Advised to use the albuterol solution, 2 puffs every 6 hours on a regular basis as needed for the feeling of shortness of breath. Advise use Mucinex DM every 12 hours to help with the cough and chest congestion. Advised to follow-up with your PCP or return to urgent care if symptoms fail to improve.

## 2021-10-31 LAB — CBC WITH DIFFERENTIAL/PLATELET
Basophils Absolute: 0.1 10*3/uL (ref 0.0–0.2)
Basos: 1 %
EOS (ABSOLUTE): 0.2 10*3/uL (ref 0.0–0.4)
Eos: 1 %
Hematocrit: 31.3 % — ABNORMAL LOW (ref 34.0–46.6)
Hemoglobin: 10.8 g/dL — ABNORMAL LOW (ref 11.1–15.9)
Immature Grans (Abs): 0.1 10*3/uL (ref 0.0–0.1)
Immature Granulocytes: 1 %
Lymphocytes Absolute: 2.5 10*3/uL (ref 0.7–3.1)
Lymphs: 23 %
MCH: 30 pg (ref 26.6–33.0)
MCHC: 34.5 g/dL (ref 31.5–35.7)
MCV: 87 fL (ref 79–97)
Monocytes Absolute: 1.1 10*3/uL — ABNORMAL HIGH (ref 0.1–0.9)
Monocytes: 10 %
Neutrophils Absolute: 6.9 10*3/uL (ref 1.4–7.0)
Neutrophils: 64 %
Platelets: 167 10*3/uL (ref 150–450)
RBC: 3.6 x10E6/uL — ABNORMAL LOW (ref 3.77–5.28)
RDW: 12.6 % (ref 11.7–15.4)
WBC: 10.8 10*3/uL (ref 3.4–10.8)

## 2021-11-21 ENCOUNTER — Other Ambulatory Visit: Payer: Self-pay | Admitting: Obstetrics and Gynecology

## 2021-11-21 DIAGNOSIS — Z363 Encounter for antenatal screening for malformations: Secondary | ICD-10-CM

## 2021-11-28 ENCOUNTER — Encounter: Payer: Self-pay | Admitting: *Deleted

## 2021-11-28 ENCOUNTER — Ambulatory Visit: Payer: Managed Care, Other (non HMO) | Admitting: *Deleted

## 2021-11-28 ENCOUNTER — Other Ambulatory Visit: Payer: Self-pay | Admitting: Obstetrics and Gynecology

## 2021-11-28 ENCOUNTER — Ambulatory Visit: Payer: Managed Care, Other (non HMO) | Attending: Obstetrics and Gynecology

## 2021-11-28 DIAGNOSIS — Z363 Encounter for antenatal screening for malformations: Secondary | ICD-10-CM

## 2021-11-28 DIAGNOSIS — O133 Gestational [pregnancy-induced] hypertension without significant proteinuria, third trimester: Secondary | ICD-10-CM | POA: Diagnosis not present

## 2021-11-28 DIAGNOSIS — O09293 Supervision of pregnancy with other poor reproductive or obstetric history, third trimester: Secondary | ICD-10-CM

## 2021-11-28 DIAGNOSIS — O09213 Supervision of pregnancy with history of pre-term labor, third trimester: Secondary | ICD-10-CM | POA: Diagnosis not present

## 2021-11-28 DIAGNOSIS — Z3A35 35 weeks gestation of pregnancy: Secondary | ICD-10-CM

## 2021-11-28 LAB — OB RESULTS CONSOLE GBS: GBS: NEGATIVE

## 2021-11-29 ENCOUNTER — Encounter (HOSPITAL_COMMUNITY): Payer: Self-pay

## 2021-11-29 ENCOUNTER — Other Ambulatory Visit: Payer: Self-pay | Admitting: Obstetrics & Gynecology

## 2021-11-29 ENCOUNTER — Telehealth (HOSPITAL_COMMUNITY): Payer: Self-pay | Admitting: *Deleted

## 2021-11-29 NOTE — Telephone Encounter (Signed)
Preadmission screen  

## 2021-11-30 ENCOUNTER — Telehealth (HOSPITAL_COMMUNITY): Payer: Self-pay | Admitting: *Deleted

## 2021-11-30 ENCOUNTER — Encounter (HOSPITAL_COMMUNITY): Payer: Self-pay | Admitting: *Deleted

## 2021-11-30 NOTE — Telephone Encounter (Signed)
Preadmission screen  

## 2021-12-05 ENCOUNTER — Ambulatory Visit
Admission: RE | Admit: 2021-12-05 | Discharge: 2021-12-05 | Disposition: A | Discharge status: Home / Self Care | Payer: Managed Care, Other (non HMO) | Source: Ambulatory Visit | Attending: Physician Assistant | Admitting: Physician Assistant

## 2021-12-05 VITALS — BP 140/97 | HR 128 | Temp 98.2°F | Resp 18

## 2021-12-05 DIAGNOSIS — J069 Acute upper respiratory infection, unspecified: Secondary | ICD-10-CM | POA: Diagnosis present

## 2021-12-05 DIAGNOSIS — Z1152 Encounter for screening for COVID-19: Secondary | ICD-10-CM

## 2021-12-05 LAB — RESP PANEL BY RT-PCR (RSV, FLU A&B, COVID)  RVPGX2
Influenza A by PCR: POSITIVE — AB
Influenza B by PCR: NEGATIVE
Resp Syncytial Virus by PCR: NEGATIVE
SARS Coronavirus 2 by RT PCR: NEGATIVE

## 2021-12-05 NOTE — ED Provider Notes (Signed)
[name]    CSN: [id number] Arrival date & time: 12/05/21  1744      History   Chief Complaint Chief Complaint  Patient presents with   Sore Throat    Sinuses - Entered by patient    HPI Angelica Kim is a 31 y.o. female.   Patient here today for evaluation of cough, nasal congestion, sinus pressure she has had for 2 days.  She reports that she is due to have induction in 3 days due to gestational hypertension.  She reports that 3 of her children tested positive for flu about a week and a half ago and she herself had COVID about 2 to 3 weeks ago.  She has been limited on medication she can take over-the-counter.  She denies any known fever.  The history is provided by the patient.  Sore Throat Pertinent negatives include no abdominal pain and no shortness of breath.    Past Medical History:  Diagnosis Date   Ankylosing spondylitis ([facility])    Asthma    Fibromyalgia    History of pre-eclampsia    Kidney stones    Migraine    Ovarian cyst    Preterm labor     Patient Active Problem List   Diagnosis Date Noted   Vacuum-assisted vaginal delivery (7/7) 08/04/2020   Gestational hypertension 08/03/2020   Allergy to amoxicillin 02/07/2020   History of premature delivery 02/07/2020   Generalized anxiety disorder 04/07/2014   Migraine 12/21/2013    Past Surgical History:  Procedure Laterality Date   COLONOSCOPY     ESOPHAGOGASTRODUODENOSCOPY ENDOSCOPY     spinal tap     WISDOM TOOTH EXTRACTION     WISDOM TOOTH EXTRACTION  2010    OB History     Gravida  3   Para  2   Term  0   Preterm  1   AB  0   Living  2      SAB  0   IAB  0   Ectopic  0   Multiple  0   Live Births  2            Home Medications    Prior to Admission medications   Medication Sig Start Date End Date Taking? Authorizing Provider  acetaminophen (TYLENOL) 325 MG tablet Take 2 tablets (650 mg total) by mouth every 4 (four) hours as needed (for pain scale <  4). 08/05/20   Holshouser, Cala Bradford B, CNM  albuterol (PROVENTIL) (2.5 MG/3ML) 0.083% nebulizer solution Take 3 mLs (2.5 mg total) by nebulization every 6 (six) hours as needed for wheezing or shortness of breath. 02/23/21 03/25/21  Theadora Rama Scales, PA-C  albuterol (VENTOLIN HFA) 108 (90 Base) MCG/ACT inhaler Inhale 2 puffs into the lungs every 6 (six) hours as needed for wheezing or shortness of breath. 10/30/21   Ellsworth Lennox, PA-C  amLODipine (NORVASC) 5 MG tablet Take 1 tablet (5 mg total) by mouth daily. 08/06/20   Holshouser, [name], CNM  cetirizine (ZYRTEC ALLERGY) 10 MG tablet Take 1 tablet (10 mg total) by mouth at bedtime. 02/23/21 05/24/21  Theadora Rama Scales, PA-C  dextromethorphan-guaiFENesin [facility] DM) 30-600 MG 12hr tablet Take 1 tablet by mouth 2 (two) times daily. 10/30/21   Ellsworth Lennox, PA-C  ferrous sulfate 325 (65 FE) MG tablet Take 1 tablet (325 mg total) by mouth every other day. 08/05/20   Holshouser, [name], CNM  fluticasone (FLONASE) 50 MCG/ACT nasal spray Place 2 sprays into both  nostrils daily. 02/23/21   Theadora Rama Scales, PA-C  Prenatal MV & Min w/FA-DHA (PRENATAL GUMMIES PO) Take by mouth.    [provider]  metoCLOPramide (REGLAN) 10 MG tablet Take 1 tablet (10 mg total) by mouth every 6 (six) hours. 03/22/17 07/31/18  [name], Coral Else, PA-C    Family History Family History  Problem Relation Age of Onset   Arthritis Mother    Deep vein thrombosis Mother    Kidney Stones Maternal Grandmother    Hypertension Maternal Grandmother    Heart disease Maternal Grandfather     Social History Social History   Tobacco Use   Smoking status: Former    Types: Cigarettes    Quit date: 2014    Years since quitting: 9.8   Smokeless tobacco: Never  Vaping Use   Vaping Use: Never used  Substance Use Topics   Alcohol use: Not Currently   Drug use: No     Allergies   Amoxicillin and Zofran [ondansetron hcl]   Review of Systems Review of  Systems  Constitutional:  Negative for chills and fever.  HENT:  Positive for congestion, ear pain, sinus pressure and sore throat.   Eyes:  Negative for discharge and redness.  Respiratory:  Positive for cough. Negative for shortness of breath and wheezing.   Gastrointestinal:  Negative for abdominal pain, diarrhea, nausea and vomiting.     Physical Exam Triage Vital Signs ED Triage Vitals  Enc Vitals Group     BP      Pulse      Resp      Temp      Temp src      SpO2      Weight      Height      Head Circumference      Peak Flow      Pain Score      Pain Loc      Pain Edu?      Excl. in GC?    No data found.  Updated Vital Signs BP (!) 140/97   Pulse (!) 128   Temp 98.2 F (36.8 C) (Oral)   Resp 18   LMP 03/02/2021 (Approximate)   SpO2 97%      Physical Exam Vitals and nursing note reviewed.  Constitutional:      General: She is not in acute distress.    Appearance: Normal appearance. She is not ill-appearing.  HENT:     Head: Normocephalic and atraumatic.     Right Ear: Tympanic membrane normal.     Left Ear: Tympanic membrane normal.     Nose: Congestion present.     Mouth/Throat:     Mouth: Mucous membranes are moist.     Pharynx: Posterior oropharyngeal erythema present. No oropharyngeal exudate.  Eyes:     Conjunctiva/sclera: Conjunctivae normal.  Cardiovascular:     Rate and Rhythm: Normal rate and regular rhythm.     Heart sounds: Normal heart sounds. No murmur heard. Pulmonary:     Effort: Pulmonary effort is normal. No respiratory distress.     Breath sounds: Normal breath sounds. No wheezing, rhonchi or rales.  Skin:    General: Skin is warm and dry.  Neurological:     Mental Status: She is alert.  Psychiatric:        Mood and Affect: Mood normal.        Thought Content: Thought content normal.      UC Treatments / Results  Labs (  all labs ordered are listed, but only abnormal results are displayed) Labs Reviewed  RESP PANEL BY  RT-PCR (RSV, FLU A&B, COVID)  RVPGX2    EKG   Radiology No results found.  Procedures Procedures (including critical care time)  Medications Ordered in UC Medications - No data to display  Initial Impression / Assessment and Plan / UC Course  I have reviewed the triage vital signs and the nursing notes.  Pertinent labs & imaging results that were available during my care of the patient were reviewed by me and considered in my medical decision making (see chart for details).   Suspect viral etiology of symptoms.  Will order screening for COVID, flu and RSV.  Discussed that COVID PCR test may still be positive from recent COVID infection.  Commended she follow-up with her OB tomorrow as there is concern for her blood pressure being elevated without illness.  Patient expresses understanding.   Final Clinical Impressions(s) / UC Diagnoses   Final diagnoses:  Acute upper respiratory infection  Encounter for screening for COVID-19   Discharge Instructions   None    ED Prescriptions   None    PDMP not reviewed this encounter.   Francene Finders, PA-C 12/05/21 1810

## 2021-12-05 NOTE — ED Triage Notes (Addendum)
Pt presents to uc with co cough congestion sinus pressure for 2 days. Pt is due to deliver baby in 3 days. Pt reports her older children tested pos for flu a 1.5 weeks ago

## 2021-12-07 ENCOUNTER — Encounter: Payer: Self-pay | Admitting: *Deleted

## 2021-12-07 ENCOUNTER — Inpatient Hospital Stay (HOSPITAL_COMMUNITY)
Admission: RE | Admit: 2021-12-07 | Discharge: 2021-12-10 | DRG: 807 | Discharge status: Against Medical Advice | Payer: Managed Care, Other (non HMO) | Attending: Obstetrics & Gynecology | Admitting: Obstetrics & Gynecology

## 2021-12-07 ENCOUNTER — Encounter (HOSPITAL_COMMUNITY): Payer: Self-pay | Admitting: Obstetrics & Gynecology

## 2021-12-07 ENCOUNTER — Inpatient Hospital Stay (HOSPITAL_COMMUNITY)
Admission: AD | Admit: 2021-12-07 | Payer: Managed Care, Other (non HMO) | Source: Home / Self Care | Admitting: Obstetrics and Gynecology

## 2021-12-07 DIAGNOSIS — O1414 Severe pre-eclampsia complicating childbirth: Principal | ICD-10-CM | POA: Diagnosis present

## 2021-12-07 DIAGNOSIS — O9952 Diseases of the respiratory system complicating childbirth: Secondary | ICD-10-CM | POA: Diagnosis present

## 2021-12-07 DIAGNOSIS — O133 Gestational [pregnancy-induced] hypertension without significant proteinuria, third trimester: Secondary | ICD-10-CM | POA: Diagnosis present

## 2021-12-07 DIAGNOSIS — Z3A37 37 weeks gestation of pregnancy: Secondary | ICD-10-CM

## 2021-12-07 DIAGNOSIS — O99284 Endocrine, nutritional and metabolic diseases complicating childbirth: Secondary | ICD-10-CM | POA: Diagnosis present

## 2021-12-07 DIAGNOSIS — Z87891 Personal history of nicotine dependence: Secondary | ICD-10-CM

## 2021-12-07 DIAGNOSIS — E876 Hypokalemia: Secondary | ICD-10-CM | POA: Diagnosis present

## 2021-12-07 DIAGNOSIS — O134 Gestational [pregnancy-induced] hypertension without significant proteinuria, complicating childbirth: Secondary | ICD-10-CM | POA: Diagnosis not present

## 2021-12-07 DIAGNOSIS — J111 Influenza due to unidentified influenza virus with other respiratory manifestations: Secondary | ICD-10-CM | POA: Diagnosis present

## 2021-12-07 DIAGNOSIS — O1413 Severe pre-eclampsia, third trimester: Principal | ICD-10-CM | POA: Diagnosis not present

## 2021-12-07 HISTORY — DX: Gestational (pregnancy-induced) hypertension without significant proteinuria, third trimester: O13.3

## 2021-12-07 MED ORDER — OXYTOCIN-SODIUM CHLORIDE 30-0.9 UT/500ML-% IV SOLN: 2.5000 [IU]/h | INTRAVENOUS | Status: DC

## 2021-12-07 MED ORDER — LACTATED RINGERS IV SOLN
500.0000 mL | INTRAVENOUS | Status: DC | PRN
  Administered 2021-12-08: 1000 mL via INTRAVENOUS
  Administered 2021-12-08: 500 mL via INTRAVENOUS

## 2021-12-07 MED ORDER — ONDANSETRON HCL 4 MG/2ML IJ SOLN: 4.0000 mg | Freq: Four times a day (QID) | INTRAMUSCULAR | Status: DC | PRN

## 2021-12-07 MED ORDER — LABETALOL HCL 5 MG/ML IV SOLN: 40.0000 mg | INTRAVENOUS | Status: DC | PRN

## 2021-12-07 MED ORDER — LABETALOL HCL 5 MG/ML IV SOLN
20.0000 mg | INTRAVENOUS | Status: DC | PRN
  Administered 2021-12-08: 20 mg via INTRAVENOUS
  Filled 2021-12-07: qty 4

## 2021-12-07 MED ORDER — OXYTOCIN BOLUS FROM INFUSION
333.0000 mL | Freq: Once | INTRAVENOUS | Status: AC
  Administered 2021-12-09: 333 mL via INTRAVENOUS

## 2021-12-07 MED ORDER — LIDOCAINE HCL (PF) 1 % IJ SOLN: 30.0000 mL | INTRAMUSCULAR | Status: DC | PRN

## 2021-12-07 MED ORDER — SOD CITRATE-CITRIC ACID 500-334 MG/5ML PO SOLN: 30.0000 mL | ORAL | Status: DC | PRN

## 2021-12-07 MED ORDER — HYDRALAZINE HCL 20 MG/ML IJ SOLN: 10.0000 mg | INTRAMUSCULAR | Status: DC | PRN

## 2021-12-07 MED ORDER — ACETAMINOPHEN 325 MG PO TABS: 650.0000 mg | ORAL_TABLET | ORAL | Status: DC | PRN

## 2021-12-07 MED ORDER — LABETALOL HCL 5 MG/ML IV SOLN: 80.0000 mg | INTRAVENOUS | Status: DC | PRN

## 2021-12-07 MED ORDER — LACTATED RINGERS IV SOLN: INTRAVENOUS | Status: DC

## 2021-12-07 NOTE — MAU Provider Note (Addendum)
Chief Complaint:  Hypertension   Event Date/Time   First Provider Initiated Contact with Patient 12/07/21 2301     HPI: Angelica Kim is a 31 y.o. G3P0102 at 20w6dwho presents to maternity admissions reporting elevated blood pressures at home. States was told to come in early for her induction.  When entering unit was called by Labor and Delivery to wait. Patient is very upset   Last med was an extra Amlodipine at 7pm. She reports good fetal movement, denies LOF, vaginal bleeding, vaginal itching/burning, urinary symptoms, dizziness, n/v, diarrhea, constipation or fever/chills.  She denies visual changes or RUQ abdominal pain.  Hypertension This is a recurrent problem. The problem has been gradually worsening since onset. Associated symptoms include headaches. Pertinent negatives include no blurred vision, chest pain or shortness of breath. There are no associated agents to hypertension. Treatments tried: Amlodipine. The current treatment provides moderate improvement. There are no compliance problems.    RN Note: Angelica Kim is a 31 y.o. at [redacted]w[redacted]d here in MAU reporting being her for 64mn induction. Her midwife told her to come in early due to B/P but as she walked in she was called by BS to be on hold. IOL due to HTN with first 2 deliveries. Denies LOF or VB and reports good FM. 1cm a wk ago. States was vtx in office today. Took Amlodipine 5mg  at 1900   Past Medical History: Past Medical History:  Diagnosis Date   Ankylosing spondylitis (HCC)    Asthma    Fibromyalgia    History of pre-eclampsia    Kidney stones    Migraine    Ovarian cyst    Preterm labor     Past obstetric history: OB History  Gravida Para Term Preterm AB Living  3 2 0 1 0 2  SAB IAB Ectopic Multiple Live Births  0 0 0 0 2    # Outcome Date GA Lbr Len/2nd Weight Sex Delivery Anes PTL Lv  3 Current           2 Para 08/04/20  02:37 / 00:21 2926 g F Vag-Vacuum EPI  LIV     Birth Comments: WDL  1 Preterm 07/31/19  [redacted]w[redacted]d 09:24 / 01:01 2634 g F Vag-Spont EPI  LIV     Birth Comments: WNL    Past Surgical History: Past Surgical History:  Procedure Laterality Date   COLONOSCOPY     ESOPHAGOGASTRODUODENOSCOPY ENDOSCOPY     spinal tap     WISDOM TOOTH EXTRACTION     WISDOM TOOTH EXTRACTION  2010    Family History: Family History  Problem Relation Age of Onset   Arthritis Mother    Deep vein thrombosis Mother    Kidney Stones Maternal Grandmother    Hypertension Maternal Grandmother    Heart disease Maternal Grandfather     Social History: Social History   Tobacco Use   Smoking status: Former    Types: Cigarettes    Quit date: 2014    Years since quitting: 9.8   Smokeless tobacco: Never  Vaping Use   Vaping Use: Never used  Substance Use Topics   Alcohol use: Not Currently   Drug use: No    Allergies:  Allergies  Allergen Reactions   Amoxicillin Hives, Shortness Of Breath and Swelling    Swelling of hands, face, and lip Has patient had a PCN reaction causing immediate rash, facial/tongue/throat swelling, SOB or lightheadedness with hypotension: Unknown Has patient had a PCN reaction causing severe rash involving mucus  membranes or skin necrosis: Yes Has patient had a PCN reaction that required hospitalization: No Has patient had a PCN reaction occurring within the last 10 years: No If all of the above answers are "NO", then may proceed with Cephalosporin use.    Zofran [Ondansetron Hcl] Nausea Only    Meds:  Medications Prior to Admission  Medication Sig Dispense Refill Last Dose   acetaminophen (TYLENOL) 325 MG tablet Take 2 tablets (650 mg total) by mouth every 4 (four) hours as needed (for pain scale < 4).      albuterol (PROVENTIL) (2.5 MG/3ML) 0.083% nebulizer solution Take 3 mLs (2.5 mg total) by nebulization every 6 (six) hours as needed for wheezing or shortness of breath. 360 mL 0    albuterol (VENTOLIN HFA) 108 (90 Base) MCG/ACT inhaler Inhale 2 puffs into the lungs  every 6 (six) hours as needed for wheezing or shortness of breath. 1 each 0    amLODipine (NORVASC) 5 MG tablet Take 1 tablet (5 mg total) by mouth daily. 30 tablet 1    cetirizine (ZYRTEC ALLERGY) 10 MG tablet Take 1 tablet (10 mg total) by mouth at bedtime. 30 tablet 2    dextromethorphan-guaiFENesin (MUCINEX DM) 30-600 MG 12hr tablet Take 1 tablet by mouth 2 (two) times daily. 14 tablet 0    ferrous sulfate 325 (65 FE) MG tablet Take 1 tablet (325 mg total) by mouth every other day. 30 tablet 1    fluticasone (FLONASE) 50 MCG/ACT nasal spray Place 2 sprays into both nostrils daily. 18 mL 0    Prenatal MV & Min w/FA-DHA (PRENATAL GUMMIES PO) Take by mouth.       I have reviewed patient's Past Medical Hx, Surgical Hx, Family Hx, Social Hx, medications and allergies.   ROS:  Review of Systems  Eyes:  Negative for blurred vision.  Respiratory:  Negative for shortness of breath.   Cardiovascular:  Negative for chest pain.  Neurological:  Positive for headaches.   Other systems negative  Physical Exam   Vitals:   12/07/21 2235 12/07/21 2237 12/07/21 2324  BP:  (!) 149/108 (!) 144/102  Pulse: (!) 105  97  Resp: 18    Temp: 98.2 F (36.8 C)    SpO2: 100%    Weight: 80.7 kg    Height: 5' (1.524 m)     Vitals:   12/07/21 2237 12/07/21 2324 12/08/21 0001 12/08/21 0200  BP: (!) 149/108 (!) 144/102 (!) 148/94 (!) 140/104  Pulse:  97 91 94  Resp:      Temp:      SpO2:      Weight:      Height:        Constitutional: Well-developed, well-nourished female in no acute distress.  Cardiovascular: normal rate and rhythm Respiratory: normal effort GI: Abd soft, non-tender, gravid appropriate for gestational age.   No rebound or guarding. MS: Extremities nontender, no edema, normal ROM Neurologic: Alert and oriented x 4.  GU: Neg CVAT.  PELVIC EXAM:  deferred  FHT:  Baseline 140 , moderate variability, accelerations present, no decelerations Contractions: Occasional and Irregular      Labs: Results for orders placed or performed during the hospital encounter of 12/07/21 (from the past 24 hour(s))  CBC     Status: Abnormal   Collection Time: 12/08/21  1:22 AM  Result Value Ref Range   WBC 11.7 (H) 4.0 - 10.5 K/uL   RBC 3.42 (L) 3.87 - 5.11 MIL/uL   Hemoglobin 10.3 (  L) 12.0 - 15.0 g/dL   HCT 44.8 (L) 18.5 - 63.1 %   MCV 84.5 80.0 - 100.0 fL   MCH 30.1 26.0 - 34.0 pg   MCHC 35.6 30.0 - 36.0 g/dL   RDW 49.7 02.6 - 37.8 %   Platelets 158 150 - 400 K/uL   nRBC 0.0 0.0 - 0.2 %  Comprehensive metabolic panel     Status: Abnormal   Collection Time: 12/08/21  1:22 AM  Result Value Ref Range   Sodium 136 135 - 145 mmol/L   Potassium 2.6 (LL) 3.5 - 5.1 mmol/L   Chloride 108 98 - 111 mmol/L   CO2 20 (L) 22 - 32 mmol/L   Glucose, Bld 88 70 - 99 mg/dL   BUN <5 (L) 6 - 20 mg/dL   Creatinine, Ser 5.88 0.44 - 1.00 mg/dL   Calcium 8.2 (L) 8.9 - 10.3 mg/dL   Total Protein 5.7 (L) 6.5 - 8.1 g/dL   Albumin 2.6 (L) 3.5 - 5.0 g/dL   AST 17 15 - 41 U/L   ALT 12 0 - 44 U/L   Alkaline Phosphatase 147 (H) 38 - 126 U/L   Total Bilirubin 0.6 0.3 - 1.2 mg/dL   GFR, Estimated >50 >27 mL/min   Anion gap 8 5 - 15    B/Positive/-- (04/19 0000)  Imaging:    MAU Course/MDM: I have reviewed the triage vital signs and the nursing notes.   Pertinent labs & imaging results that were available during my care of the patient were reviewed by me and considered in my medical decision making (see chart for details).      I have reviewed her medical records including past results, notes and treatments.   I have ordered labs and reviewed results. They are normal except for Hypokalemia Will order PO repletion. NST reviewed, reassuring, category I Consult Dr Connye Burkitt with presentation, exam findings and test results.  Treatments in MAU included EFM, Preeclampsia focused order set ordered.    Assessment: Single IUP at [redacted]w[redacted]d Gestational Hypertension Headache, rule out severe  preeclampsia  Plan: .Admit to Labor and Delivery Routine orders Begin K-Dur for repletion ( x 1) then repeat BMET at 0600 MD to follow   Wynelle Bourgeois CNM, MSN Certified Nurse-Midwife 12/07/2021 11:01 PM

## 2021-12-07 NOTE — H&P (Signed)
Angelica Kim is a 31 y.o. female presenting for IOL for gHTN.  Pt denies HA, blurred vision or RUQ pain.  She was dxd with Gestaional HTN around 33 weeks and started on 5 mg amlodipine.  Today in the office she had BPS 140/103 and 141/101.  She took another 5 mg of amlodipine.  And will come in for IOL.  Pt was also diagnosed with the flu 11/3.  She has mild sxs History OB History     Gravida  3   Para  2   Term  0   Preterm  1   AB  0   Living  2      SAB  0   IAB  0   Ectopic  0   Multiple  0   Live Births  2          Past Medical History:  Diagnosis Date   Ankylosing spondylitis (HCC)    Asthma    Fibromyalgia    History of pre-eclampsia    Kidney stones    Migraine    Ovarian cyst    Preterm labor    Past Surgical History:  Procedure Laterality Date   COLONOSCOPY     ESOPHAGOGASTRODUODENOSCOPY ENDOSCOPY     spinal tap     WISDOM TOOTH EXTRACTION     WISDOM TOOTH EXTRACTION  2010   Family History: family history includes Arthritis in her mother; Deep vein thrombosis in her mother; Heart disease in her maternal grandfather; Hypertension in her maternal grandmother; Kidney Stones in her maternal grandmother. Social History:  reports that she quit smoking about 9 years ago. Her smoking use included cigarettes. She has never used smokeless tobacco. She reports that she does not currently use alcohol. She reports that she does not use drugs.  Review of Systems - Negative except above  Pregnancy complicated BY  Fibromyalgia Kidney stones H/o PIH     Last menstrual period 03/02/2021, not currently breastfeeding. Exam Physical Exam   Prenatal labs: ABO, Rh: B/Positive/-- (04/19 0000) Antibody: Negative (04/19 0000) Rubella: Immune (04/19 0000) RPR: Nonreactive (04/19 0000)  HBsAg: Negative (04/19 0000)  HIV: Non-reactive (04/19 0000)  GBS:   neg  Assessment/Plan: GHTN  CHECK LABS  MAY NEED ISOLATION DUE TO THE FLU CHECK CX TO DETERMINE PITOCIN  OR CYTOTEC Anticipate SVD   Severin Bou A Deaunna Olarte 12/07/2021, 10:08 PM

## 2021-12-07 NOTE — MAU Note (Signed)
.  Angelica Kim is a 31 y.o. at [redacted]w[redacted]d here in MAU reporting being her for 70mn induction. Her midwife told her to come in early due to B/P but as she walked in she was called by BS to be on hold. IOL due to HTN with first 2 deliveries. Denies LOF or VB and reports good FM. 1cm a wk ago. States was vtx in office today. Took Amlodipine 5mg  at 1900  Onset of complaint: n/a Pain score: 0 Vitals:   12/07/21 2235 12/07/21 2237  BP:  (!) 149/108  Pulse: (!) 105   Resp: 18   Temp: 98.2 F (36.8 C)   SpO2: 100%      FHT:150 Lab orders placed from triage:  mau labor

## 2021-12-07 NOTE — MAU Note (Signed)
Pt very upset that she was told to come in early by Dr Normand Sloop and then had to go to MAU due to acuity in BS. PT was crying and considering going home if she had to wait in MAU for hours. After talking with pt and explaining we can start IV, draw labs, monitor baby, and give meds for her HTN, she agreed to stay. Waited for pt to calm down before repeating B/P

## 2021-12-08 ENCOUNTER — Other Ambulatory Visit: Payer: Self-pay

## 2021-12-08 ENCOUNTER — Inpatient Hospital Stay (HOSPITAL_COMMUNITY): Payer: Managed Care, Other (non HMO) | Admitting: Anesthesiology

## 2021-12-08 ENCOUNTER — Encounter (HOSPITAL_COMMUNITY): Payer: Self-pay | Admitting: Obstetrics & Gynecology

## 2021-12-08 ENCOUNTER — Inpatient Hospital Stay (HOSPITAL_COMMUNITY): Payer: Managed Care, Other (non HMO)

## 2021-12-08 DIAGNOSIS — E876 Hypokalemia: Secondary | ICD-10-CM | POA: Diagnosis present

## 2021-12-08 DIAGNOSIS — Z3A37 37 weeks gestation of pregnancy: Secondary | ICD-10-CM | POA: Diagnosis not present

## 2021-12-08 DIAGNOSIS — O99284 Endocrine, nutritional and metabolic diseases complicating childbirth: Secondary | ICD-10-CM | POA: Diagnosis present

## 2021-12-08 DIAGNOSIS — O1413 Severe pre-eclampsia, third trimester: Principal | ICD-10-CM | POA: Diagnosis not present

## 2021-12-08 DIAGNOSIS — O1414 Severe pre-eclampsia complicating childbirth: Secondary | ICD-10-CM | POA: Diagnosis present

## 2021-12-08 DIAGNOSIS — J111 Influenza due to unidentified influenza virus with other respiratory manifestations: Secondary | ICD-10-CM | POA: Diagnosis present

## 2021-12-08 DIAGNOSIS — O9952 Diseases of the respiratory system complicating childbirth: Secondary | ICD-10-CM | POA: Diagnosis present

## 2021-12-08 DIAGNOSIS — Z87891 Personal history of nicotine dependence: Secondary | ICD-10-CM | POA: Diagnosis not present

## 2021-12-08 DIAGNOSIS — O134 Gestational [pregnancy-induced] hypertension without significant proteinuria, complicating childbirth: Secondary | ICD-10-CM | POA: Diagnosis present

## 2021-12-08 HISTORY — DX: Severe pre-eclampsia, third trimester: O14.13

## 2021-12-08 LAB — COMPREHENSIVE METABOLIC PANEL
ALT: 12 U/L (ref 0–44)
ALT: 14 U/L (ref 0–44)
AST: 17 U/L (ref 15–41)
AST: 16 U/L (ref 15–41)
Albumin: 2.6 g/dL — ABNORMAL LOW (ref 3.5–5.0)
Albumin: 2.6 g/dL — ABNORMAL LOW (ref 3.5–5.0)
Alkaline Phosphatase: 147 U/L — ABNORMAL HIGH (ref 38–126)
Alkaline Phosphatase: 146 U/L — ABNORMAL HIGH (ref 38–126)
Anion gap: 8 (ref 5–15)
Anion gap: 10 (ref 5–15)
BUN: <5 mg/dL — ABNORMAL LOW (ref 6–20)
BUN: <5 mg/dL — ABNORMAL LOW (ref 6–20)
CO2: 20 mmol/L — ABNORMAL LOW (ref 22–32)
CO2: 20 mmol/L — ABNORMAL LOW (ref 22–32)
Calcium: 8.2 mg/dL — ABNORMAL LOW (ref 8.9–10.3)
Calcium: 8.2 mg/dL — ABNORMAL LOW (ref 8.9–10.3)
Chloride: 108 mmol/L (ref 98–111)
Chloride: 109 mmol/L (ref 98–111)
Creatinine, Ser: 0.53 mg/dL (ref 0.44–1.00)
Creatinine, Ser: 0.59 mg/dL (ref 0.44–1.00)
GFR, Estimated: >60 mL/min (ref >60)
GFR, Estimated: >60 mL/min (ref >60)
Glucose, Bld: 88 mg/dL (ref 70–99)
Glucose, Bld: 93 mg/dL (ref 70–99)
Potassium: 2.6 mmol/L — CL (ref 3.5–5.1)
Potassium: 3.2 mmol/L — ABNORMAL LOW (ref 3.5–5.1)
Sodium: 136 mmol/L (ref 135–145)
Sodium: 139 mmol/L (ref 135–145)
Total Bilirubin: 0.6 mg/dL (ref 0.3–1.2)
Total Bilirubin: 0.4 mg/dL (ref 0.3–1.2)
Total Protein: 5.7 g/dL — ABNORMAL LOW (ref 6.5–8.1)
Total Protein: 5.7 g/dL — ABNORMAL LOW (ref 6.5–8.1)

## 2021-12-08 LAB — BASIC METABOLIC PANEL
Anion gap: 6 (ref 5–15)
BUN: <5 mg/dL — ABNORMAL LOW (ref 6–20)
CO2: 22 mmol/L (ref 22–32)
Calcium: 8.1 mg/dL — ABNORMAL LOW (ref 8.9–10.3)
Chloride: 110 mmol/L (ref 98–111)
Creatinine, Ser: 0.6 mg/dL (ref 0.44–1.00)
GFR, Estimated: >60 mL/min (ref >60)
Glucose, Bld: 85 mg/dL (ref 70–99)
Potassium: 2.6 mmol/L — CL (ref 3.5–5.1)
Sodium: 138 mmol/L (ref 135–145)

## 2021-12-08 LAB — CBC
HCT: 28.9 % — ABNORMAL LOW (ref 36.0–46.0)
HCT: 30.1 % — ABNORMAL LOW (ref 36.0–46.0)
HCT: 30.7 % — ABNORMAL LOW (ref 36.0–46.0)
Hemoglobin: 10.3 g/dL — ABNORMAL LOW (ref 12.0–15.0)
Hemoglobin: 10.6 g/dL — ABNORMAL LOW (ref 12.0–15.0)
Hemoglobin: 10.6 g/dL — ABNORMAL LOW (ref 12.0–15.0)
MCH: 30.1 pg (ref 26.0–34.0)
MCH: 29.7 pg (ref 26.0–34.0)
MCH: 29.6 pg (ref 26.0–34.0)
MCHC: 35.6 g/dL (ref 30.0–36.0)
MCHC: 35.2 g/dL (ref 30.0–36.0)
MCHC: 34.5 g/dL (ref 30.0–36.0)
MCV: 84.5 fL (ref 80.0–100.0)
MCV: 84.3 fL (ref 80.0–100.0)
MCV: 85.8 fL (ref 80.0–100.0)
Platelets: 158 10*3/uL (ref 150–400)
Platelets: 162 10*3/uL (ref 150–400)
Platelets: 175 10*3/uL (ref 150–400)
RBC: 3.42 MIL/uL — ABNORMAL LOW (ref 3.87–5.11)
RBC: 3.57 MIL/uL — ABNORMAL LOW (ref 3.87–5.11)
RBC: 3.58 MIL/uL — ABNORMAL LOW (ref 3.87–5.11)
RDW: 13.8 % (ref 11.5–15.5)
RDW: 13.7 % (ref 11.5–15.5)
RDW: 13.9 % (ref 11.5–15.5)
WBC: 11.7 10*3/uL — ABNORMAL HIGH (ref 4.0–10.5)
WBC: 10.1 10*3/uL (ref 4.0–10.5)
WBC: 11.2 10*3/uL — ABNORMAL HIGH (ref 4.0–10.5)
nRBC: 0 % (ref 0.0–0.2)
nRBC: 0 % (ref 0.0–0.2)
nRBC: 0 % (ref 0.0–0.2)

## 2021-12-08 LAB — TYPE AND SCREEN
ABO/RH(D): B POS
Antibody Screen: NEGATIVE

## 2021-12-08 LAB — PROTEIN / CREATININE RATIO, URINE
Creatinine, Urine: 66 mg/dL
Protein Creatinine Ratio: 0.3 mg/mg{Cre} — ABNORMAL HIGH (ref 0.00–0.15)
Total Protein, Urine: 20 mg/dL

## 2021-12-08 LAB — RPR: RPR Ser Ql: NONREACTIVE

## 2021-12-08 MED ORDER — SODIUM CHLORIDE 0.9 % IV SOLN
25.0000 mg | Freq: Once | INTRAVENOUS | Status: AC
  Administered 2021-12-08: 25 mg via INTRAVENOUS
  Filled 2021-12-08: qty 1

## 2021-12-08 MED ORDER — PHENYLEPHRINE 80 MCG/ML (10ML) SYRINGE FOR IV PUSH (FOR BLOOD PRESSURE SUPPORT)
80.0000 ug | PREFILLED_SYRINGE | INTRAVENOUS | Status: DC | PRN
  Filled 2021-12-08: qty 10

## 2021-12-08 MED ORDER — TERBUTALINE SULFATE 1 MG/ML IJ SOLN: 0.2500 mg | Freq: Once | INTRAMUSCULAR | Status: DC | PRN

## 2021-12-08 MED ORDER — MAGNESIUM SULFATE 40 GM/1000ML IV SOLN
1.0000 g/h | INTRAVENOUS | Status: DC
  Administered 2021-12-09: 2 g/h via INTRAVENOUS
  Filled 2021-12-08: qty 1000

## 2021-12-08 MED ORDER — MAGNESIUM SULFATE BOLUS VIA INFUSION: 4.0000 g | Freq: Once | INTRAVENOUS | Status: AC

## 2021-12-08 MED ORDER — MAGNESIUM SULFATE 40 GM/1000ML IV SOLN
INTRAVENOUS | Status: AC
  Administered 2021-12-08: 4 g via INTRAVENOUS
  Filled 2021-12-08: qty 1000

## 2021-12-08 MED ORDER — LACTATED RINGERS IV SOLN: INTRAVENOUS | Status: DC

## 2021-12-08 MED ORDER — OXYTOCIN-SODIUM CHLORIDE 30-0.9 UT/500ML-% IV SOLN: 1.0000 m[IU]/min | INTRAVENOUS | Status: DC

## 2021-12-08 MED ORDER — DIPHENHYDRAMINE HCL 50 MG/ML IJ SOLN: 12.5000 mg | INTRAMUSCULAR | Status: DC | PRN

## 2021-12-08 MED ORDER — POTASSIUM CHLORIDE CRYS ER 20 MEQ PO TBCR
40.0000 meq | EXTENDED_RELEASE_TABLET | Freq: Once | ORAL | Status: AC
  Administered 2021-12-08: 40 meq via ORAL
  Filled 2021-12-08: qty 2

## 2021-12-08 MED ORDER — MISOPROSTOL 25 MCG QUARTER TABLET
25.0000 ug | ORAL_TABLET | ORAL | Status: DC | PRN
  Administered 2021-12-08 (×2): 25 ug via VAGINAL
  Filled 2021-12-08 (×2): qty 1

## 2021-12-08 MED ORDER — EPHEDRINE 5 MG/ML INJ: 10.0000 mg | INTRAVENOUS | Status: DC | PRN

## 2021-12-08 MED ORDER — FENTANYL-BUPIVACAINE-NACL 0.5-0.125-0.9 MG/250ML-% EP SOLN: 12.0000 mL/h | EPIDURAL | Status: DC | PRN

## 2021-12-08 MED ORDER — POTASSIUM CHLORIDE 10 MEQ/100ML IV SOLN
10.0000 meq | INTRAVENOUS | Status: AC
  Administered 2021-12-08 (×4): 10 meq via INTRAVENOUS
  Filled 2021-12-08 (×4): qty 100

## 2021-12-08 MED ORDER — DIPHENHYDRAMINE HCL 50 MG/ML IJ SOLN
12.5000 mg | INTRAMUSCULAR | Status: DC | PRN
  Administered 2021-12-08 – 2021-12-09 (×2): 12.5 mg via INTRAVENOUS
  Filled 2021-12-08: qty 1

## 2021-12-08 MED ORDER — LACTATED RINGERS IV SOLN: 500.0000 mL | Freq: Once | INTRAVENOUS | Status: DC

## 2021-12-08 MED ORDER — OXYTOCIN-SODIUM CHLORIDE 30-0.9 UT/500ML-% IV SOLN
1.0000 m[IU]/min | INTRAVENOUS | Status: DC
  Administered 2021-12-08: 2 m[IU]/min via INTRAVENOUS
  Filled 2021-12-08: qty 500

## 2021-12-08 MED ORDER — FENTANYL-BUPIVACAINE-NACL 0.5-0.125-0.9 MG/250ML-% EP SOLN
12.0000 mL/h | EPIDURAL | Status: DC | PRN
  Administered 2021-12-08: 12 mL/h via EPIDURAL
  Filled 2021-12-08: qty 250

## 2021-12-08 MED ORDER — AMLODIPINE BESYLATE 5 MG PO TABS
5.0000 mg | ORAL_TABLET | Freq: Every day | ORAL | Status: DC
  Administered 2021-12-08 – 2021-12-09 (×2): 5 mg via ORAL
  Filled 2021-12-08 (×2): qty 1

## 2021-12-08 MED ORDER — POTASSIUM CHLORIDE CRYS ER 20 MEQ PO TBCR
40.0000 meq | EXTENDED_RELEASE_TABLET | Freq: Two times a day (BID) | ORAL | Status: DC
  Administered 2021-12-08: 40 meq via ORAL
  Administered 2021-12-08: 20 meq via ORAL
  Administered 2021-12-09 – 2021-12-10 (×3): 40 meq via ORAL
  Filled 2021-12-08 (×6): qty 2

## 2021-12-08 MED ORDER — PHENYLEPHRINE 80 MCG/ML (10ML) SYRINGE FOR IV PUSH (FOR BLOOD PRESSURE SUPPORT): 80.0000 ug | PREFILLED_SYRINGE | INTRAVENOUS | Status: DC | PRN

## 2021-12-08 MED ORDER — LIDOCAINE HCL (PF) 1 % IJ SOLN
INTRAMUSCULAR | Status: DC | PRN
  Administered 2021-12-08: 8 mL via EPIDURAL

## 2021-12-08 NOTE — Anesthesia Procedure Notes (Signed)
Epidural Patient location during procedure: OB Start time: 12/08/2021 4:14 PM End time: 12/08/2021 4:17 PM  Staffing Anesthesiologist: Bethena Midget, MD  Preanesthetic Checklist Completed: patient identified, IV checked, site marked, risks and benefits discussed, surgical consent, monitors and equipment checked, pre-op evaluation and timeout performed  Epidural Patient position: sitting Prep: DuraPrep and site prepped and draped Patient monitoring: continuous pulse ox and blood pressure Approach: midline Location: L3-L4 Injection technique: LOR air  Needle:  Needle type: Tuohy  Needle gauge: 17 G Needle length: 9 cm and 9 Needle insertion depth: 6 cm Catheter type: closed end flexible Catheter size: 19 Gauge Catheter at skin depth: 12 cm Test dose: negative  Assessment Events: blood not aspirated, injection not painful, no injection resistance, no paresthesia and negative IV test

## 2021-12-08 NOTE — Progress Notes (Signed)
Subjective:    Doing well. Planning epidural later in labor  Objective:    VS: BP (!) 151/106 (BP Location: Right Arm)   Pulse 84   Temp 98.2 F (36.8 C) (Oral)   Resp 20   Ht 5' (1.524 m)   Wt 80.7 kg   LMP 03/02/2021 (Approximate)   SpO2 98%   BMI 34.76 kg/m  FHR : baseline 140 / variability moderate / accelerations present / absent decelerations Toco: contractions every 2-3 after TOCO adjustment  Membranes: intact Dilation: 3 Effacement (%): 50 Station: -3 Presentation: Vertex Exam by:: CNM Lonia Roane   Assessment/Plan:   31 y.o. [id number] [redacted]w[redacted]d IOL for GHTN Hypokalemia    -oral and IV replacement    -repeat CMP after IV doses are complete  Labor:  S/P Cytotec ripening, will start Pitocin then AROM PRN Preeclampsia:  no signs or symptoms of toxicity and labs stable Fetal Wellbeing:  Category I Pain Control:   plans to have an epidural I/D:   GBS neg Anticipated MOD:  NSVD  [name] DNP, CNM 12/08/2021 4:17 PM

## 2021-12-08 NOTE — Anesthesia Preprocedure Evaluation (Signed)
Anesthesia Evaluation  Patient identified by MRN, date of birth, ID band Patient awake    Reviewed: Allergy & Precautions, H&P , NPO status , Patient's Chart, lab work & pertinent test results, reviewed documented beta blocker date and time   Airway Mallampati: II  TM Distance: >3 FB Neck ROM: full    Dental no notable dental hx. (+) Teeth Intact, Dental Advisory Given   Pulmonary neg pulmonary ROS, asthma , former smoker   Pulmonary exam normal breath sounds clear to auscultation       Cardiovascular hypertension, Normal cardiovascular exam Rhythm:regular Rate:Normal     Neuro/Psych  Headaches PSYCHIATRIC DISORDERS Anxiety      Neuromuscular disease negative neurological ROS  negative psych ROS   GI/Hepatic negative GI ROS, Neg liver ROS,,,  Endo/Other  negative endocrine ROS    Renal/GU Renal diseasenegative Renal ROS  negative genitourinary   Musculoskeletal  (+) Arthritis ,  Fibromyalgia -  Abdominal   Peds  Hematology negative hematology ROS (+)   Anesthesia Other Findings   Reproductive/Obstetrics (+) Pregnancy                             Anesthesia Physical Anesthesia Plan  ASA: 3  Anesthesia Plan: Epidural   Post-op Pain Management: Minimal or no pain anticipated   Induction:   PONV Risk Score and Plan: 2  Airway Management Planned: Natural Airway  Additional Equipment: None  Intra-op Plan:   Post-operative Plan:   Informed Consent: I have reviewed the patients History and Physical, chart, labs and discussed the procedure including the risks, benefits and alternatives for the proposed anesthesia with the patient or authorized representative who has indicated his/her understanding and acceptance.       Plan Discussed with: Anesthesiologist  Anesthesia Plan Comments:        Anesthesia Quick Evaluation

## 2021-12-08 NOTE — Progress Notes (Signed)
FHTs not tracing well. Pt had turned to L lateral. Transducer adjusted

## 2021-12-08 NOTE — MAU Note (Signed)
.  CRITICAL VALUE STICKER  CRITICAL VALUE: K 2.6  RECEIVER (on-site recipient of call): Joline Salt, RN  DATE & TIME NOTIFIED: 12/08/2021 0228  MESSENGER (representative from lab): Woodroe Chen  MD NOTIFIED: Wynelle Bourgeois, RN  TIME OF NOTIFICATION: 0228  RESPONSE: see new orders

## 2021-12-08 NOTE — Progress Notes (Signed)
Subjective:    Comfortable with epidural. Agrees to amniotomy  Objective:    VS: BP 135/89   Pulse 79   Temp 98.2 F (36.8 C) (Oral)   Resp 16   Ht 5' (1.524 m)   Wt 80.7 kg   LMP 03/02/2021 (Approximate)   SpO2 98%   BMI 34.76 kg/m  FHR : baseline 135 / variability moderate / accelerations present / absent decelerations Toco: contractions every 2-4 minutes  Membranes: AROM, clear Dilation: 3 Effacement (%): 50 Station: -3 Presentation: Vertex Exam by:: Luz Brazen RN Pitocin 12 mU/min CMP     Component Value Date/Time   NA 139 12/08/2021 1931   K 3.2 (L) 12/08/2021 1931   CL 109 12/08/2021 1931   CO2 20 (L) 12/08/2021 1931   GLUCOSE 93 12/08/2021 1931   BUN <5 (L) 12/08/2021 1931   CREATININE 0.59 12/08/2021 1931   CREATININE 0.75 12/31/2013 1512   CALCIUM 8.2 (L) 12/08/2021 1931   PROT 5.7 (L) 12/08/2021 1931   ALBUMIN 2.6 (L) 12/08/2021 1931   AST 16 12/08/2021 1931   ALT 14 12/08/2021 1931   ALKPHOS 146 (H) 12/08/2021 1931   BILITOT 0.4 12/08/2021 1931   GFRNONAA >60 12/08/2021 1931   GFRAA >60 08/01/2019 0334     Assessment/Plan:   31 y.o. [id number] [redacted]w[redacted]d IOL for GHTN Hypokalemia    -oral and IV replacement    -3.2 increased from 2.6   Labor:  S/P Cytotec ripening, will start Pitocin then AROM PRN Preeclampsia:  Severe headache, moderate range BP, starting magnesium sulfate 4G load and 2G/hr Fetal Wellbeing:  Category I Pain Control:   plans to have an epidural I/D:   GBS neg Anticipated MOD:  NSVD  Plan discussed with Dr. Camillo Flaming DNP, CNM 12/08/2021 7:00 PM

## 2021-12-08 NOTE — Progress Notes (Signed)
PT sitting up in bed playing on phone. B/P 140/104

## 2021-12-09 ENCOUNTER — Encounter (HOSPITAL_COMMUNITY): Payer: Self-pay | Admitting: Obstetrics & Gynecology

## 2021-12-09 LAB — COMPREHENSIVE METABOLIC PANEL
ALT: 13 U/L (ref 0–44)
AST: 20 U/L (ref 15–41)
Albumin: 2.6 g/dL — ABNORMAL LOW (ref 3.5–5.0)
Alkaline Phosphatase: 146 U/L — ABNORMAL HIGH (ref 38–126)
Anion gap: 9 (ref 5–15)
BUN: <5 mg/dL — ABNORMAL LOW (ref 6–20)
CO2: 23 mmol/L (ref 22–32)
Calcium: 7.1 mg/dL — ABNORMAL LOW (ref 8.9–10.3)
Chloride: 108 mmol/L (ref 98–111)
Creatinine, Ser: 0.6 mg/dL (ref 0.44–1.00)
GFR, Estimated: >60 mL/min (ref >60)
Glucose, Bld: 103 mg/dL — ABNORMAL HIGH (ref 70–99)
Potassium: 3.5 mmol/L (ref 3.5–5.1)
Sodium: 140 mmol/L (ref 135–145)
Total Bilirubin: 0.2 mg/dL — ABNORMAL LOW (ref 0.3–1.2)
Total Protein: 5.6 g/dL — ABNORMAL LOW (ref 6.5–8.1)

## 2021-12-09 LAB — CBC WITH DIFFERENTIAL/PLATELET
Abs Immature Granulocytes: 0.12 10*3/uL — ABNORMAL HIGH (ref 0.00–0.07)
Basophils Absolute: 0.1 10*3/uL (ref 0.0–0.1)
Basophils Relative: 0 %
Eosinophils Absolute: 0.1 10*3/uL (ref 0.0–0.5)
Eosinophils Relative: 1 %
HCT: 33.9 % — ABNORMAL LOW (ref 36.0–46.0)
Hemoglobin: 11.4 g/dL — ABNORMAL LOW (ref 12.0–15.0)
Immature Granulocytes: 1 %
Lymphocytes Relative: 15 %
Lymphs Abs: 3.2 10*3/uL (ref 0.7–4.0)
MCH: 29.2 pg (ref 26.0–34.0)
MCHC: 33.6 g/dL (ref 30.0–36.0)
MCV: 86.9 fL (ref 80.0–100.0)
Monocytes Absolute: 1.4 10*3/uL — ABNORMAL HIGH (ref 0.1–1.0)
Monocytes Relative: 7 %
Neutro Abs: 16.3 10*3/uL — ABNORMAL HIGH (ref 1.7–7.7)
Neutrophils Relative %: 76 %
Platelets: 197 10*3/uL (ref 150–400)
RBC: 3.9 MIL/uL (ref 3.87–5.11)
RDW: 14.1 % (ref 11.5–15.5)
WBC: 21.2 10*3/uL — ABNORMAL HIGH (ref 4.0–10.5)
nRBC: 0 % (ref 0.0–0.2)

## 2021-12-09 LAB — CBC
HCT: 32.1 % — ABNORMAL LOW (ref 36.0–46.0)
Hemoglobin: 11 g/dL — ABNORMAL LOW (ref 12.0–15.0)
MCH: 29.7 pg (ref 26.0–34.0)
MCHC: 34.3 g/dL (ref 30.0–36.0)
MCV: 86.8 fL (ref 80.0–100.0)
Platelets: 195 10*3/uL (ref 150–400)
RBC: 3.7 MIL/uL — ABNORMAL LOW (ref 3.87–5.11)
RDW: 14 % (ref 11.5–15.5)
WBC: 14.1 10*3/uL — ABNORMAL HIGH (ref 4.0–10.5)
nRBC: 0 % (ref 0.0–0.2)

## 2021-12-09 LAB — MAGNESIUM: Magnesium: 5 mg/dL — ABNORMAL HIGH (ref 1.7–2.4)

## 2021-12-09 MED ORDER — BENZOCAINE-MENTHOL 20-0.5 % EX AERO: 1.0000 | INHALATION_SPRAY | CUTANEOUS | Status: DC | PRN

## 2021-12-09 MED ORDER — IBUPROFEN 600 MG PO TABS
600.0000 mg | ORAL_TABLET | Freq: Four times a day (QID) | ORAL | Status: DC
  Administered 2021-12-09 – 2021-12-10 (×5): 600 mg via ORAL
  Filled 2021-12-09 (×5): qty 1

## 2021-12-09 MED ORDER — ALBUTEROL SULFATE HFA 108 (90 BASE) MCG/ACT IN AERS: 2.0000 | INHALATION_SPRAY | Freq: Four times a day (QID) | RESPIRATORY_TRACT | Status: DC | PRN

## 2021-12-09 MED ORDER — DIPHENHYDRAMINE HCL 25 MG PO CAPS: 25.0000 mg | ORAL_CAPSULE | Freq: Four times a day (QID) | ORAL | Status: DC | PRN

## 2021-12-09 MED ORDER — LACTATED RINGERS IV SOLN: INTRAVENOUS | Status: DC

## 2021-12-09 MED ORDER — ACETAMINOPHEN 325 MG PO TABS
650.0000 mg | ORAL_TABLET | ORAL | Status: DC | PRN
  Administered 2021-12-09: 650 mg via ORAL
  Filled 2021-12-09 (×2): qty 2

## 2021-12-09 MED ORDER — FLUTICASONE PROPIONATE 50 MCG/ACT NA SUSP
2.0000 | Freq: Every day | NASAL | Status: DC
  Administered 2021-12-09: 2 via NASAL
  Filled 2021-12-09: qty 16

## 2021-12-09 MED ORDER — DIBUCAINE (PERIANAL) 1 % EX OINT: 1.0000 | TOPICAL_OINTMENT | CUTANEOUS | Status: DC | PRN

## 2021-12-09 MED ORDER — OXYCODONE HCL 5 MG PO TABS: 5.0000 mg | ORAL_TABLET | ORAL | Status: DC | PRN

## 2021-12-09 MED ORDER — AMLODIPINE BESYLATE 5 MG PO TABS
10.0000 mg | ORAL_TABLET | Freq: Every day | ORAL | Status: DC
  Administered 2021-12-10: 10 mg via ORAL
  Filled 2021-12-09: qty 2

## 2021-12-09 MED ORDER — SENNOSIDES-DOCUSATE SODIUM 8.6-50 MG PO TABS
2.0000 | ORAL_TABLET | ORAL | Status: DC
  Administered 2021-12-09 – 2021-12-10 (×2): 2 via ORAL
  Filled 2021-12-09 (×2): qty 2

## 2021-12-09 MED ORDER — SIMETHICONE 80 MG PO CHEW: 80.0000 mg | CHEWABLE_TABLET | ORAL | Status: DC | PRN

## 2021-12-09 MED ORDER — WITCH HAZEL-GLYCERIN EX PADS: 1.0000 | MEDICATED_PAD | CUTANEOUS | Status: DC | PRN

## 2021-12-09 MED ORDER — AMLODIPINE BESYLATE 5 MG PO TABS
5.0000 mg | ORAL_TABLET | Freq: Once | ORAL | Status: AC
  Administered 2021-12-09: 5 mg via ORAL
  Filled 2021-12-09: qty 1

## 2021-12-09 MED ORDER — TETANUS-DIPHTH-ACELL PERTUSSIS 5-2.5-18.5 LF-MCG/0.5 IM SUSY: 0.5000 mL | PREFILLED_SYRINGE | Freq: Once | INTRAMUSCULAR | Status: DC

## 2021-12-09 MED ORDER — PRENATAL MULTIVITAMIN CH
1.0000 | ORAL_TABLET | Freq: Every day | ORAL | Status: DC
  Administered 2021-12-09 – 2021-12-10 (×2): 1 via ORAL
  Filled 2021-12-09 (×2): qty 1

## 2021-12-09 MED ORDER — COCONUT OIL OIL: 1.0000 | TOPICAL_OIL | Status: DC | PRN

## 2021-12-09 NOTE — Progress Notes (Signed)
Date and time results received: 12/09/21 1410 (use smartphrase ".now" to insert current time)  Test: Magnesium Critical Value: 5.0  Name of Provider Notified: Sallye Ober MD  Orders Received? Or Actions Taken?: Orders Received - See Orders for details

## 2021-12-09 NOTE — Progress Notes (Signed)
Angelica Kim is a 31 y.o. G3 P2 PPD # 0 after a vaginal delivery,   Subjective: Patient reports feeling "whoozy", head feels full. She denies frank headache/chest pain/ shortness of breath/nausea/vomiting. She has light vaginal bleeding. Her abdominal pain is well controlled.   Objective: BP (!) 140/87 (BP Location: Right Arm)   Pulse 83   Temp 97.6 F (36.4 C) (Oral)   Resp 18   Ht 5' (1.524 m)   Wt 80.7 kg   LMP 03/02/2021 (Approximate)   SpO2 98%   Breastfeeding Unknown   BMI 34.76 kg/m  I/O last 3 completed shifts: In: 6501.9 [P.O.:3433; I.V.:2904.6; Other:114.2; IV Piggyback:50.2] Out: 4150 [Urine:3925; Blood:225] Total I/O In: 240 [P.O.:240] Out: 2200 [Urine:2200] CVS: S1, s2, RRR Pulmonary: Clear to auscultation bilaterally Abdomen: Soft, non tender, fundus firm at umbilicus. Extremities: Warm and well perfused, 2+ patellar reflex bilaterally.  Labs: Lab Results  Component Value Date   WBC 14.1 (H) 12/09/2021   HGB 11.0 (L) 12/09/2021   HCT 32.1 (L) 12/09/2021   MCV 86.8 12/09/2021   PLT 195 12/09/2021    Assessment / Plan: 31 y/o PP D # 0 after vaginal delivery, was an induction of labor for Gestational HTN and developed headache during labor and therefore with severe features, now on postpartum magnesium sulfate, - Stopped magnesium, Checked magnesium level and consider restarting at a lower rate.  Prescilla Sours, MD 12/09/2021, 2:25 PM

## 2021-12-09 NOTE — Progress Notes (Signed)
Per IP on-call, pt should remain on droplet precautions for minimum of seven days post-positive flu test, then if symptomatic at that time would continue as long as symptomatic. Dr. Sallye Ober notified and order rec'd.

## 2021-12-09 NOTE — Lactation Note (Signed)
This note was copied from a baby's chart. Lactation Consultation Note  Patient Name: Angelica Kim [id number] Date: 12/09/2021   Age:31 hours Per RN Jon Gills) in L&D, Birth Parent declined [facility] services in L&D tonight. Maternal Data    Feeding    LATCH Score Latch: Grasps breast easily, tongue down, lips flanged, rhythmical sucking.  Audible Swallowing: Spontaneous and intermittent  Type of Nipple: Everted at rest and after stimulation  Comfort (Breast/Nipple): Soft / non-tender  Hold (Positioning): No assistance needed to correctly position infant at breast.  LATCH Score: 10   Lactation Tools Discussed/Used    Interventions    Discharge    Consult Status      Frederico Hamman 12/09/2021, 1:51 AM

## 2021-12-09 NOTE — Anesthesia Postprocedure Evaluation (Signed)
Anesthesia Post Note  Patient: Angelica Kim  Procedure(s) Performed: AN AD HOC LABOR EPIDURAL     Patient location during evaluation: Mother Baby Anesthesia Type: Epidural Level of consciousness: awake and alert and oriented Pain management: satisfactory to patient Vital Signs Assessment: post-procedure vital signs reviewed and stable Respiratory status: respiratory function stable Cardiovascular status: stable Postop Assessment: no headache, no backache, epidural receding, patient able to bend at knees, no signs of nausea or vomiting, adequate PO intake and able to ambulate Anesthetic complications: no   No notable events documented.  Last Vitals:  Vitals:   12/09/21 0630 12/09/21 0802  BP:  (!) 152/99  Pulse:  77  Resp: 17 19  Temp:  36.5 C  SpO2:  98%    Last Pain:  Vitals:   12/09/21 0802  TempSrc: Oral  PainSc:    Pain Goal: Patients Stated Pain Goal: 0 (12/08/21 0300)                 Karleen Dolphin

## 2021-12-09 NOTE — Lactation Note (Signed)
This note was copied from a baby's chart. Lactation Consultation Note  Patient Name: Angelica Kim [id number]'P Date: 12/09/2021 Reason for consult: Initial assessment;Early term 37-38.6wks;Infant weight loss;Breastfeeding assistance (0.22% WL) Age:32 hours  LC entered the room and the infant was laying on the birth parent's bed.  Per the birth parent, she is pumping and bottle feeding her infant breast milk.  She stated that she has breast fed her two older children and she remembers how to hand express.  LC reviewed the outpatient brochure.  The birth parent had no further questions or concerns.   Current Feeding Plan:  Pump q3hrs and feed EBM to the infant via a bottle.  Call RN/LC for assistance with breastfeeding.   Maternal Data Has patient been taught Hand Expression?: Yes Does the patient have breastfeeding experience prior to this delivery?: Yes How long did the patient breastfeed?: She pumped and latched with her oldest and exclusively pumped with middle child  Feeding    LATCH Score                    Lactation Tools Discussed/Used    Interventions Interventions: LC Services brochure  Discharge Pump: DEBP;Personal  Consult Status Consult Status: Follow-up Date: 12/10/21 Follow-up type: In-patient    Angelica Kim 12/09/2021, 11:33 AM

## 2021-12-10 MED ORDER — AMLODIPINE BESYLATE 10 MG PO TABS: 10.0000 mg | ORAL_TABLET | Freq: Every day | ORAL | 1 refills | Status: DC

## 2021-12-10 MED ORDER — AMLODIPINE BESYLATE 5 MG PO TABS: 10.0000 mg | ORAL_TABLET | Freq: Every day | ORAL | Status: DC

## 2021-12-10 MED ORDER — LISINOPRIL 20 MG PO TABS: 20.0000 mg | ORAL_TABLET | Freq: Every day | ORAL | 1 refills | Status: DC

## 2021-12-10 MED ORDER — LISINOPRIL 10 MG PO TABS
10.0000 mg | ORAL_TABLET | Freq: Every day | ORAL | Status: DC
  Administered 2021-12-10: 10 mg via ORAL
  Filled 2021-12-10: qty 1

## 2021-12-10 MED ORDER — LISINOPRIL 10 MG PO TABS
10.0000 mg | ORAL_TABLET | Freq: Once | ORAL | Status: AC
  Administered 2021-12-10: 10 mg via ORAL
  Filled 2021-12-10: qty 1

## 2021-12-10 MED ORDER — LISINOPRIL 10 MG PO TABS: 20.0000 mg | ORAL_TABLET | Freq: Every day | ORAL | Status: DC

## 2021-12-10 MED ORDER — IBUPROFEN 600 MG PO TABS: 600.0000 mg | ORAL_TABLET | Freq: Four times a day (QID) | ORAL | 0 refills | Status: DC

## 2021-12-10 NOTE — Progress Notes (Signed)
Patient requested AMA form. RN spoke with Dr. Sallye Ober and [facility]. Patient alert and oriented x4, ambulatory, and pain stable.

## 2021-12-10 NOTE — Progress Notes (Signed)
MOB was referred for history of depression/anxiety. * Referral screened out by Clinical Social Worker because none of the following criteria appear to apply: ~ History of anxiety/depression during this pregnancy, or of post-partum depression following prior delivery. ~ Diagnosis of anxiety and/or depression within last 3 years. No concerns noted in OB records.  OR * MOB's symptoms currently being treated with medication and/or therapy.  Please contact the Clinical Social Worker if needs arise, by MOB request, or if MOB scores greater than 9/yes to question 10 on Edinburgh Postpartum Depression Screen.   There are no barriers to discharge.   Angelica Kim, MSW, LCSW Clinical Social Work [phone] 

## 2021-12-10 NOTE — Plan of Care (Signed)

## 2021-12-10 NOTE — Progress Notes (Addendum)
Post Partum Day 1 Subjective: no complaints, up ad lib, and voiding.  She denies headache/changes in vision/chest pain/shortness of breath/nausea/vomiting/abdominal pain. She reports light vaginal bleeding. She is breastfeeding her baby.   Objective: Blood pressure (!) 153/108, pulse 90, temperature 97.7 F (36.5 C), temperature source Oral, resp. rate 18, height 5' (1.524 m), weight 80.7 kg, last menstrual period 03/02/2021, SpO2 100 %, unknown if currently breastfeeding.     12/10/2021    3:28 PM 12/10/2021   11:56 AM 12/10/2021    8:53 AM  Vitals with BMI  Systolic 153 148 427  Diastolic 108 102 92  Pulse 90 86 78    Physical Exam:  General: alert, cooperative, and no distress CVS: s1, S2,regular rate and rhythm Pulmonary; Clear to auscultation bilaterally Abdomen: soft, non tender, fundus firm 1 FB below umbilicus Lochia: appropriate Uterine Fundus: firm Incision: N/A DVT Evaluation: No evidence of DVT seen on physical exam. No significant calf/ankle edema.  Recent Labs    12/09/21 0205 12/09/21 1257  HGB 11.4* 11.0*  HCT 33.9* 32.1*   CBC    Component Value Date/Time   WBC 14.1 (H) 12/09/2021 1257   RBC 3.70 (L) 12/09/2021 1257   HGB 11.0 (L) 12/09/2021 1257   HGB 10.8 (L) 10/30/2021 0924   HCT 32.1 (L) 12/09/2021 1257   HCT 31.3 (L) 10/30/2021 0924   PLT 195 12/09/2021 1257   PLT 167 10/30/2021 0924   MCV 86.8 12/09/2021 1257   MCV 87 10/30/2021 0924   MCH 29.7 12/09/2021 1257   MCHC 34.3 12/09/2021 1257   RDW 14.0 12/09/2021 1257   RDW 12.6 10/30/2021 0924   LYMPHSABS 3.2 12/09/2021 0205   LYMPHSABS 2.5 10/30/2021 0924   MONOABS 1.4 (H) 12/09/2021 0205   EOSABS 0.1 12/09/2021 0205   EOSABS 0.2 10/30/2021 0924   BASOSABS 0.1 12/09/2021 0205   BASOSABS 0.1 10/30/2021 0924    CMP     Component Value Date/Time   NA 140 12/09/2021 1257   K 3.5 12/09/2021 1257   CL 108 12/09/2021 1257   CO2 23 12/09/2021 1257   GLUCOSE 103 (H) 12/09/2021 1257    BUN <5 (L) 12/09/2021 1257   CREATININE 0.60 12/09/2021 1257   CREATININE 0.75 12/31/2013 1512   CALCIUM 7.1 (L) 12/09/2021 1257   PROT 5.6 (L) 12/09/2021 1257   ALBUMIN 2.6 (L) 12/09/2021 1257   AST 20 12/09/2021 1257   ALT 13 12/09/2021 1257   ALKPHOS 146 (H) 12/09/2021 1257   BILITOT 0.2 (L) 12/09/2021 1257   GFRNONAA >60 12/09/2021 1257   GFRAA >60 08/01/2019 0334     Assessment/Plan: 31 y/o [id number] post partum day # 1, s/p induction of labor for preeclampsia with severe features, s/p 15 hours of postpartum magnesium sulfate, on Norvasc 10 mg Q daily for blood pressure control, - With continued elevated pressures. Will add lisinopril10 mg Q daily (as per pharmacist it is generally acceptable for breastfeeding women to use lisinopril) and continue with close blood pressure monitoring. Discussed with patient importance of controlling blood pressures in the postpartum period. -With influenza, continue with contact precautions.  - She desires circumcision of her baby boy.  Discussed risks, benefits and alternatives of circumcision of baby and her questions were answered.  - Resolved hypokalemia, continue with oral potassium supplements and food with high potassium levels.     LOS: 2 days   Prescilla Sours, MD 12/10/2021, 3:36 PM

## 2021-12-10 NOTE — Progress Notes (Signed)
Patient ID: Angelica Kim, female   DOB: Sep 03, 1990, 31 y.o.   MRN: [id number]  I was called by RN Aggie Cosier as patient did not desire to stay at the hospital any more and desired to go home today.  I called into the patient's room and discussed with patient further.  Patient expressed that being in the hospital was stressful to her and was causing her pressures to be elevated. She expressed she was not able to rest well due to frequent checks on the baby and her by the staff.  She also desired to leave the hospital to tend to her other children at home. I expressed understanding of  the circumstances but also discussed the importance of continued inpatient admission due to her preeclampsia with severe features and with uncontrolled blood pressures in the postpartum period.  We discussed risks of uncontrolled high blood pressure and she expressed understanding of this. I advised her that we needed to increase her blood pressure medication further for better control of her blood pressure and advised her to continue with inpatient stay.  She was given time to make a decision.  A few minutes later RN Crystal called me to inform me that patient still desired to leave against medical advise.  RN was advised to proceed with having the  patient sign the AMA forms.  Prescriptions of her blood pressure medication and pain medication were sent in to her pharmacy and RN was instructed to tell the patient to pick up her medication. I also asked RN to advise patient to follow up in the office early this week within 2 days.  Dr. Hoover Browns.  12/10/2021.

## 2021-12-10 NOTE — Lactation Note (Signed)
This note was copied from a baby's chart. Lactation Consultation Note  Patient Name: Angelica Kim Date: 12/10/2021 Reason for consult: Follow-up assessment;Early term 37-38.6wks Age:31 hours  LC in to room for follow up. Lactating parent (LP) states family will be discharged today. LP reports goof feedings and output. She is bottlefeeding expressed milk mostly. Talked about milk coming into volume since collecting ~20 mL per pumping session. Encouraged LP rest, hydration and food intake.  Contact LC as needed for feeds/support/concerns/questions. All questions answered at this time. Reviewed LC brochure and other resources available in the community.      Maternal Data Has patient been taught Hand Expression?: Yes Does the patient have breastfeeding experience prior to this delivery?: Yes  Feeding Mother's Current Feeding Choice: Breast Milk  Lactation Tools Discussed/Used Tools: Pump Breast pump type: Double-Electric Breast Pump Reason for Pumping: ETI Pumping frequency: every 3h Pumped volume: 20 mL  Interventions Interventions: Education;Expressed milk;DEBP  Discharge Discharge Education: Engorgement and breast care;Warning signs for feeding baby Pump: DEBP;Personal  Consult Status Consult Status: Complete Date: 12/10/21 Follow-up type: Call as needed    Angelica Kim 12/10/2021, 3:04 PM

## 2021-12-17 ENCOUNTER — Encounter (INDEPENDENT_AMBULATORY_CARE_PROVIDER_SITE_OTHER): Payer: Self-pay | Admitting: Gastroenterology

## 2021-12-27 NOTE — Discharge Summary (Signed)
Postpartum Discharge Summary    Patient Name: Angelica Kim DOB: 11/21/1990 MRN: [id number]  Date of admission: 12/07/2021 Delivery date:12/09/2021  Delivering provider: Lavonda Jumbo  Date of discharge: 12/10/2021  Admitting diagnosis: Gestational HTN, third trimester [O13.3] Intrauterine pregnancy: [redacted]w[redacted]d     Secondary diagnosis:  Principal Problem:   Hypertension in pregnancy, preeclampsia, severe, third trimester Active Problems:   Gestational HTN, third trimester   SVD (spontaneous vaginal delivery)  Additional problems: Hypokalemia, Maternal Influenza infection.   Discharge diagnosis: Term Pregnancy Delivered and Preeclampsia (severe)                                              Post partum procedures: None Augmentation: AROM, Pitocin, and Cytotec Complications: Preeclampsia with severe features.   Hospital course: Induction of Labor With Vaginal Delivery   31 y.o. yo [id number] at [redacted]w[redacted]d was admitted to the hospital 12/07/2021 for induction of labor.  Indication for induction: Gestational hypertension.  Patient had an labor course complicated by Preeclampsia with severe features and magnesium sulfate was started.   Membrane Rupture Time/Date: 9:05 PM ,12/08/2021   Delivery Method:Vaginal, Spontaneous  Episiotomy: None  Lacerations:  None  Details of delivery can be found in separate delivery note.  Patient had a postpartum course complicated by hypokalemia which resolved with potassium supplementation.  She was placed on droplet precautions for her influenza infection.  She also received magnesium sulfate intrapartum and for 15 hours postpartum, further magnesium sulfate administration was declined by patient due to side effects of woozy feeling.  For her  uncontrolled hypertension she was placed on  Norvasc 10 mg Q daily and Lisinopril 20 mg Q daily. Her blood pressure remained uncontrolled on postpartum day 1 and she was advised to continue with inpatient management in order  to continue with antihypertensive medication adjustment. However patient decided to leave against medical advice on 12/10/21 stating she had to go home to take care of her other children and that being in the hospital was stressful for her. She signed he AMA forms and she was advised to follow up in the office within 2 days.    Newborn Data: Birth date:12/09/2021  Birth time:1:11 AM  Gender:Female  Living status:Living  Apgars:9 ,9  Weight:2.75 kg   Magnesium Sulfate received: Yes: Seizure prophylaxis, during labor induction and postpartum period for only 15 hours.    Physical exam  Vitals:   12/10/21 1528 12/10/21 1624 12/10/21 1700 12/10/21 1826  BP: (!) 153/108 136/88 (!) 143/87 (!) 143/87  Pulse: 90 92  86  Resp: 18     Temp: 97.7 F (36.5 C)     TempSrc: Oral     SpO2: 100%     Weight:      Height:       General: alert, cooperative, and no distress Lochia: appropriate Uterine Fundus: firm Incision: N/A DVT Evaluation: No evidence of DVT seen on physical exam. Calf/Ankle edema is present Labs: Lab Results  Component Value Date   WBC 14.1 (H) 12/09/2021   HGB 11.0 (L) 12/09/2021   HCT 32.1 (L) 12/09/2021   MCV 86.8 12/09/2021   PLT 195 12/09/2021      Latest Ref Rng & Units 12/09/2021   12:57 PM  CMP  Glucose 70 - 99 mg/dL 532   BUN 6 - 20 mg/dL <5   Creatinine 0.23 - 1.00  mg/dL 7.62   Sodium 831 - 517 mmol/L 140   Potassium 3.5 - 5.1 mmol/L 3.5   Chloride 98 - 111 mmol/L 108   CO2 22 - 32 mmol/L 23   Calcium 8.9 - 10.3 mg/dL 7.1   Total Protein 6.5 - 8.1 g/dL 5.6   Total Bilirubin 0.3 - 1.2 mg/dL 0.2   Alkaline Phos 38 - 126 U/L 146   AST 15 - 41 U/L 20   ALT 0 - 44 U/L 13    Edinburgh Score:    08/04/2020    8:04 AM  Edinburgh Postnatal Depression Scale Screening Tool  I have been able to laugh and see the funny side of things. 0  I have looked forward with enjoyment to things. 0  I have blamed myself unnecessarily when things went wrong. 1  I have  been anxious or worried for no good reason. 2  I have felt scared or panicky for no good reason. 1  Things have been getting on top of me. 2  I have been so unhappy that I have had difficulty sleeping. 0  I have felt sad or miserable. 1  I have been so unhappy that I have been crying. 0  The thought of harming myself has occurred to me. 0  Edinburgh Postnatal Depression Scale Total 7     After visit meds:  Allergies as of 12/10/2021       Reactions   Amoxicillin Hives, Shortness Of Breath, Swelling   Swelling of hands, face, and lip Has patient had a PCN reaction causing immediate rash, facial/tongue/throat swelling, SOB or lightheadedness with hypotension: Unknown Has patient had a PCN reaction causing severe rash involving mucus membranes or skin necrosis: Yes Has patient had a PCN reaction that required hospitalization: No Has patient had a PCN reaction occurring within the last 10 years: No If all of the above answers are "NO", then may proceed with Cephalosporin use.   Zofran [ondansetron Hcl] Nausea Only        Medication List     STOP taking these medications    cetirizine 10 MG tablet Commonly known as: ZyrTEC Allergy   dextromethorphan-guaiFENesin 30-600 MG 12hr tablet Commonly known as: MUCINEX DM       TAKE these medications    acetaminophen 325 MG tablet Commonly known as: Tylenol Take 2 tablets (650 mg total) by mouth every 4 (four) hours as needed (for pain scale < 4).   albuterol (2.5 MG/3ML) 0.083% nebulizer solution Commonly known as: PROVENTIL Take 3 mLs (2.5 mg total) by nebulization every 6 (six) hours as needed for wheezing or shortness of breath.   albuterol 108 (90 Base) MCG/ACT inhaler Commonly known as: VENTOLIN HFA Inhale 2 puffs into the lungs every 6 (six) hours as needed for wheezing or shortness of breath.   amLODipine 10 MG tablet Commonly known as: NORVASC Take 1 tablet (10 mg total) by mouth daily. What changed:  medication  strength how much to take   ferrous sulfate 325 (65 FE) MG tablet Take 1 tablet (325 mg total) by mouth every other day.   fluticasone 50 MCG/ACT nasal spray Commonly known as: FLONASE Place 2 sprays into both nostrils daily.   ibuprofen 600 MG tablet Commonly known as: ADVIL Take 1 tablet (600 mg total) by mouth every 6 (six) hours.   lisinopril 20 MG tablet Commonly known as: ZESTRIL Take 1 tablet (20 mg total) by mouth daily for 60 doses.   mometasone 50 MCG/ACT nasal  spray Commonly known as: NASONEX Place 2 sprays into the nose daily.   PRENATAL GUMMIES PO Take by mouth.       Discharge home in stable condition Infant Feeding: Breast Infant Disposition:home with mother Discharge instruction: per After Visit Summary and Postpartum booklet. Activity: Advance as tolerated. Pelvic rest for 6 weeks.  Diet: routine diet Future Appointments:No future appointments. Follow up Visit:  Follow-up Information     Ob/Gyn, [place]. Schedule an appointment as soon as possible for a visit.   Specialty: Obstetrics and Gynecology Why: Blood pressure check Contact information: [address] [place] [address] [phone]         Ob/Gyn, [place]. Schedule an appointment as soon as possible for a visit in 6 week(s).   Specialty: Obstetrics and Gynecology Why: Postpartum check. Contact information: [address] [place] [address] [phone]                 Anticipated Birth Control:  Posey Rea   12/27/2021 Prescilla Sours, MD

## 2022-03-01 ENCOUNTER — Encounter: Payer: Self-pay | Admitting: Emergency Medicine

## 2022-03-01 ENCOUNTER — Ambulatory Visit
Admission: EM | Admit: 2022-03-01 | Discharge: 2022-03-01 | Disposition: A | Discharge status: Home / Self Care | Payer: Medicaid Other | Attending: Internal Medicine | Admitting: Internal Medicine

## 2022-03-01 DIAGNOSIS — N644 Mastodynia: Secondary | ICD-10-CM | POA: Diagnosis not present

## 2022-03-01 MED ORDER — CLINDAMYCIN HCL 150 MG PO CAPS: 450.0000 mg | ORAL_CAPSULE | Freq: Three times a day (TID) | ORAL | 0 refills | Status: AC

## 2022-03-01 NOTE — ED Provider Notes (Signed)
[name]    CSN: [id number] Arrival date & time: 03/01/22  0865      History   Chief Complaint Chief Complaint  Patient presents with   Breast Pain    HPI Angelica Kim is a 32 y.o. female.   Patient presents with left breast pain that started yesterday.  Patient denies injury to the breast.  Patient reports that she is currently breast-feeding and her milk supply in the left breast has decreased significantly over the past few days.  She states that she is having breast pain in the breast.  She reports that she has been having some swelling and redness as well.  Denies any obvious nipple discharge but does report that she has had some white substance coming out but she is not sure if it is milk or not.  Has taken Tylenol.  Denies any associated fever.  Patient reports that she is only pumping to breast-feed.     Past Medical History:  Diagnosis Date   Ankylosing spondylitis ([place])    Asthma    Fibromyalgia    History of pre-eclampsia    History of premature delivery 02/07/2020   Kidney stones    Migraine    Ovarian cyst    Preterm labor     Patient Active Problem List   Diagnosis Date Noted   SVD (spontaneous vaginal delivery) 12/09/2021   Hypertension in pregnancy, preeclampsia, severe, third trimester 12/08/2021   Gestational HTN, third trimester 12/07/2021   Gestational hypertension 08/03/2020   Allergy to amoxicillin 02/07/2020   Generalized anxiety disorder 04/07/2014   Migraine 12/21/2013    Past Surgical History:  Procedure Laterality Date   COLONOSCOPY     ESOPHAGOGASTRODUODENOSCOPY ENDOSCOPY     spinal tap     WISDOM TOOTH EXTRACTION     WISDOM TOOTH EXTRACTION  2010    OB History     Gravida  3   Para  3   Term  1   Preterm  1   AB  0   Living  3      SAB  0   IAB  0   Ectopic  0   Multiple  0   Live Births  3            Home Medications    Prior to Admission medications   Medication Sig Start Date End  Date Taking? Authorizing Provider  acetaminophen (TYLENOL) 325 MG tablet Take 2 tablets (650 mg total) by mouth every 4 (four) hours as needed (for pain scale < 4). 08/05/20  Yes Holshouser, Theone Murdoch, CNM  albuterol (VENTOLIN HFA) 108 (90 Base) MCG/ACT inhaler Inhale 2 puffs into the lungs every 6 (six) hours as needed for wheezing or shortness of breath. 10/30/21  Yes Nyoka Lint, PA-C  clindamycin (CLEOCIN) 150 MG capsule Take 3 capsules (450 mg total) by mouth every 8 (eight) hours for 10 days. 03/01/22 03/11/22 Yes Jakyla Reza, Michele Rockers, FNP  fluticasone (FLONASE) 50 MCG/ACT nasal spray Place 2 sprays into both nostrils daily. 02/23/21  Yes Lynden Oxford Scales, PA-C  Prenatal MV & Min w/FA-DHA (PRENATAL GUMMIES PO) Take by mouth.   Yes [provider]  albuterol (PROVENTIL) (2.5 MG/3ML) 0.083% nebulizer solution Take 3 mLs (2.5 mg total) by nebulization every 6 (six) hours as needed for wheezing or shortness of breath. 02/23/21 03/25/21  Lynden Oxford Scales, PA-C  amLODipine (NORVASC) 10 MG tablet Take 1 tablet (10 mg total) by mouth daily. 12/11/21 02/09/22  Alesia Richards,  Ema, MD  ferrous sulfate 325 (65 FE) MG tablet Take 1 tablet (325 mg total) by mouth every other day. 08/05/20   Holshouser, Theone Murdoch, CNM  ibuprofen (ADVIL) 600 MG tablet Take 1 tablet (600 mg total) by mouth every 6 (six) hours. 12/10/21   Waymon Amato, MD  lisinopril (ZESTRIL) 20 MG tablet Take 1 tablet (20 mg total) by mouth daily for 60 doses. 12/11/21 02/09/22  Waymon Amato, MD  mometasone (NASONEX) 50 MCG/ACT nasal spray Place 2 sprays into the nose daily.    [provider]  metoCLOPramide (REGLAN) 10 MG tablet Take 1 tablet (10 mg total) by mouth every 6 (six) hours. 03/22/17 07/31/18  [name], [name], PA-C    Family History Family History  Problem Relation Age of Onset   Arthritis Mother    Deep vein thrombosis Mother    Kidney Stones Maternal Grandmother    Hypertension Maternal Grandmother    Heart disease Maternal  Grandfather     Social History Social History   Tobacco Use   Smoking status: Former    Types: Cigarettes    Quit date: 2014    Years since quitting: 10.0   Smokeless tobacco: Never  Vaping Use   Vaping Use: Never used  Substance Use Topics   Alcohol use: Not Currently   Drug use: No     Allergies   Amoxicillin and Zofran [ondansetron hcl]   Review of Systems Review of Systems Per HPI  Physical Exam Triage Vital Signs ED Triage Vitals  Enc Vitals Group     BP 03/01/22 1032 131/85     Pulse Rate 03/01/22 1032 85     Resp 03/01/22 1032 18     Temp 03/01/22 1032 (!) 97.5 F (36.4 C)     Temp Source 03/01/22 1032 Oral     SpO2 03/01/22 1032 98 %     Weight 03/01/22 1033 160 lb (72.6 kg)     Height 03/01/22 1033 5' (1.524 m)     Head Circumference --      Peak Flow --      Pain Score 03/01/22 1033 4     Pain Loc --      Pain Edu? --      Excl. in [place]? --    No data found.  Updated Vital Signs BP 131/85 (BP Location: Right Arm)   Pulse 85   Temp (!) 97.5 F (36.4 C) (Oral)   Resp 18   Ht 5' (1.524 m)   Wt 160 lb (72.6 kg)   LMP 03/02/2021 (Approximate)   SpO2 98%   Breastfeeding Yes   BMI 31.25 kg/m   Visual Acuity Right Eye Distance:   Left Eye Distance:   Bilateral Distance:    Right Eye Near:   Left Eye Near:    Bilateral Near:     Physical Exam Exam conducted with a chaperone present.  Constitutional:      General: She is not in acute distress.    Appearance: Normal appearance. She is not toxic-appearing or diaphoretic.  HENT:     Head: Normocephalic and atraumatic.  Eyes:     Extraocular Movements: Extraocular movements intact.     Conjunctiva/sclera: Conjunctivae normal.  Pulmonary:     Effort: Pulmonary effort is normal.  Chest:       Comments: There is no tenderness to palpation throughout breast.  There is no obvious swelling or discoloration.  No masses noted.  Nipple appears normal when compared to right  breast.  No nipple  discharge noted. Neurological:     General: No focal deficit present.     Mental Status: She is alert and oriented to person, place, and time. Mental status is at baseline.  Psychiatric:        Mood and Affect: Mood normal.        Behavior: Behavior normal.        Thought Content: Thought content normal.        Judgment: Judgment normal.      UC Treatments / Results  Labs (all labs ordered are listed, but only abnormal results are displayed) Labs Reviewed - No data to display  EKG   Radiology No results found.  Procedures Procedures (including critical care time)  Medications Ordered in UC Medications - No data to display  Initial Impression / Assessment and Plan / UC Course  I have reviewed the triage vital signs and the nursing notes.  Pertinent labs & imaging results that were available during my care of the patient were reviewed by me and considered in my medical decision making (see chart for details).     Differential diagnoses for left breast pain include clogged milk duct versus lactational mastitis.  Although, physical exam is not super consistent with either one of these.  Not super convincing for infection.  There are no masses noted.  Although, given that patient is currently breast-feeding will cover for mastitis with clindamycin as patient has penicillin allergy and is unsure if she has ever taken cephalosporin.  Clindamycin should be safe with breast-feeding.  Advised her to take this with food to avoid stomach upset.  Also advised patient to continue breast-feeding on the left side and to massage breast.  She was also advised to follow-up with lactation specialist.  Will order imaging of the breast to rule out any other worrisome etiologies.  Patient's information faxed over to breast [facility] as they should reach out to her to schedule an appointment.  Patient was given contact information for the imaging center and advised to follow-up with  them if she does not hear from them in a few days.  Discussed return precautions.  Patient verbalized understanding and was agreeable with plan. Final Clinical Impressions(s) / UC Diagnoses   Final diagnoses:  Breast pain, left     Discharge Instructions      I have prescribed an antibiotic in case mastitis is present.  Take this with food to avoid stomach upset.  I have ordered an ultrasound of your breast.  Someone will reach out to you from the breast imaging center for follow-up.  Recommend following up with lactation specialist as well.  Continue to breast-feed on the left side.    ED Prescriptions     Medication Sig Dispense Auth. Provider   clindamycin (CLEOCIN) 150 MG capsule Take 3 capsules (450 mg total) by mouth every 8 (eight) hours for 10 days. 90 capsule Niland, Michele Rockers, [place]      PDMP not reviewed this encounter.   Teodora Medici, [place] 03/01/22 1145

## 2022-03-01 NOTE — ED Triage Notes (Signed)
Patient c/o left breast pain, redness and swelling x 1 day.  Patient is currently breast feeding.  Taken Tylenol.

## 2022-03-01 NOTE — Discharge Instructions (Signed)
I have prescribed an antibiotic in case mastitis is present.  Take this with food to avoid stomach upset.  I have ordered an ultrasound of your breast.  Someone will reach out to you from the breast imaging center for follow-up.  Recommend following up with lactation specialist as well.  Continue to breast-feed on the left side.

## 2022-03-02 ENCOUNTER — Other Ambulatory Visit: Payer: Self-pay | Admitting: Internal Medicine

## 2022-03-02 DIAGNOSIS — N644 Mastodynia: Secondary | ICD-10-CM

## 2022-03-28 ENCOUNTER — Ambulatory Visit (HOSPITAL_COMMUNITY)
Admission: EM | Admit: 2022-03-28 | Discharge: 2022-03-28 | Disposition: A | Discharge status: Home / Self Care | Payer: 59

## 2022-03-28 ENCOUNTER — Encounter (HOSPITAL_COMMUNITY): Payer: Self-pay | Admitting: Psychiatry

## 2022-03-28 DIAGNOSIS — F4320 Adjustment disorder, unspecified: Secondary | ICD-10-CM | POA: Diagnosis not present

## 2022-03-28 NOTE — Discharge Instructions (Signed)
You are scheduled for a new patient appointment with [facility] for April 10, 2021 at [address] [place], [place] [address] [phone]  Alternatively, you may call the number on back of your insurance card for a comprehensive list of in-network providers.
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
## 2022-04-04 ENCOUNTER — Other Ambulatory Visit (HOSPITAL_COMMUNITY): Payer: Self-pay

## 2022-04-04 ENCOUNTER — Other Ambulatory Visit: Payer: Self-pay

## 2022-04-04 MED ORDER — LISDEXAMFETAMINE DIMESYLATE 20 MG PO CAPS: 20.0000 mg | ORAL_CAPSULE | Freq: Every day | ORAL | 0 refills | Status: DC

## 2022-04-04 MED ORDER — LISDEXAMFETAMINE DIMESYLATE 20 MG PO CAPS
20.0000 mg | ORAL_CAPSULE | Freq: Every day | ORAL | 0 refills | Status: DC
  Filled 2022-04-04: qty 30, 30d supply, fill #0

## 2022-05-04 ENCOUNTER — Other Ambulatory Visit (HOSPITAL_COMMUNITY): Payer: Self-pay

## 2022-05-04 MED ORDER — LISDEXAMFETAMINE DIMESYLATE 20 MG PO CAPS
20.0000 mg | ORAL_CAPSULE | Freq: Every day | ORAL | 0 refills | Status: DC
  Filled 2022-05-04: qty 30, 30d supply, fill #0

## 2022-05-14 ENCOUNTER — Emergency Department: Payer: Managed Care, Other (non HMO)

## 2022-05-14 ENCOUNTER — Emergency Department
Admission: EM | Admit: 2022-05-14 | Discharge: 2022-05-14 | Disposition: A | Discharge status: Home / Self Care | Payer: Managed Care, Other (non HMO) | Attending: Emergency Medicine | Admitting: Emergency Medicine

## 2022-05-14 ENCOUNTER — Other Ambulatory Visit: Payer: Self-pay

## 2022-05-14 DIAGNOSIS — J45909 Unspecified asthma, uncomplicated: Secondary | ICD-10-CM | POA: Diagnosis not present

## 2022-05-14 DIAGNOSIS — R42 Dizziness and giddiness: Secondary | ICD-10-CM | POA: Insufficient documentation

## 2022-05-14 DIAGNOSIS — N939 Abnormal uterine and vaginal bleeding, unspecified: Secondary | ICD-10-CM | POA: Diagnosis present

## 2022-05-14 DIAGNOSIS — N938 Other specified abnormal uterine and vaginal bleeding: Secondary | ICD-10-CM

## 2022-05-14 LAB — URINALYSIS, ROUTINE W REFLEX MICROSCOPIC
Bacteria, UA: NONE SEEN
Bilirubin Urine: NEGATIVE
Glucose, UA: NEGATIVE mg/dL
Ketones, ur: NEGATIVE mg/dL
Leukocytes,Ua: NEGATIVE
Nitrite: NEGATIVE
Protein, ur: NEGATIVE mg/dL
RBC / HPF: >50 RBC/hpf (ref 0–5)
Specific Gravity, Urine: 1.026 (ref 1.005–1.030)
pH: 6 (ref 5.0–8.0)

## 2022-05-14 LAB — POC URINE PREG, ED: Preg Test, Ur: NEGATIVE

## 2022-05-14 LAB — CBC
HCT: 41.8 % (ref 36.0–46.0)
Hemoglobin: 14 g/dL (ref 12.0–15.0)
MCH: 28.6 pg (ref 26.0–34.0)
MCHC: 33.5 g/dL (ref 30.0–36.0)
MCV: 85.5 fL (ref 80.0–100.0)
Platelets: 264 10*3/uL (ref 150–400)
RBC: 4.89 MIL/uL (ref 3.87–5.11)
RDW: 13.2 % (ref 11.5–15.5)
WBC: 11.8 10*3/uL — ABNORMAL HIGH (ref 4.0–10.5)
nRBC: 0 % (ref 0.0–0.2)

## 2022-05-14 LAB — BASIC METABOLIC PANEL
Anion gap: 6 (ref 5–15)
BUN: 15 mg/dL (ref 6–20)
CO2: 26 mmol/L (ref 22–32)
Calcium: 9 mg/dL (ref 8.9–10.3)
Chloride: 108 mmol/L (ref 98–111)
Creatinine, Ser: 0.76 mg/dL (ref 0.44–1.00)
GFR, Estimated: >60 mL/min (ref >60)
Glucose, Bld: 89 mg/dL (ref 70–99)
Potassium: 4.1 mmol/L (ref 3.5–5.1)
Sodium: 140 mmol/L (ref 135–145)

## 2022-05-14 LAB — TSH: TSH: 1.317 u[IU]/mL (ref 0.350–4.500)

## 2022-05-14 MED ORDER — MEDROXYPROGESTERONE ACETATE 5 MG PO TABS: 5.0000 mg | ORAL_TABLET | Freq: Every day | ORAL | 0 refills | Status: DC

## 2022-05-14 NOTE — Discharge Instructions (Signed)
Your labs are normal Take the medication as prescribed Follow up with your doctor for iud or recheck

## 2022-05-14 NOTE — ED Provider Notes (Signed)
[facility] Provider Note    Event Date/Time   First MD Initiated Contact with Patient 05/14/22 1421     (approximate)   History   Vaginal Bleeding   HPI  Angelica Kim is a 32 y.o. female with history of ovarian cyst, migraines, asthma, presents emergency department with complaints of heavy vaginal bleeding for 3 days.  States for the last 3 days she has been going through 3 tampons and 1 pad per hour.  Is never had this problem before.  Patient is waiting to see her doctor to get an IUD.  States became a little lightheaded and dizzy earlier today.  Her LMP was 2 and half weeks ago.      Physical Exam   Triage Vital Signs: ED Triage Vitals  Enc Vitals Group     BP 05/14/22 1336 114/73     Pulse Rate 05/14/22 1336 (!) 112     Resp 05/14/22 1336 16     Temp 05/14/22 1336 99.1 F (37.3 C)     Temp Source 05/14/22 1336 Oral     SpO2 05/14/22 1336 100 %     Weight 05/14/22 1332 165 lb (74.8 kg)     Height 05/14/22 1332 5' (1.524 m)     Head Circumference --      Peak Flow --      Pain Score 05/14/22 1332 6     Pain Loc --      Pain Edu? --      Excl. in GC? --     Most recent vital signs: Vitals:   05/14/22 1336  BP: 114/73  Pulse: (!) 112  Resp: 16  Temp: 99.1 F (37.3 C)  SpO2: 100%     General: Awake, no distress.   CV:  Good peripheral perfusion. regular rate and  rhythm Resp:  Normal effort.  Abd:  No distention.  Nontender Other:     ED Results / Procedures / Treatments   Labs (all labs ordered are listed, but only abnormal results are displayed) Labs Reviewed  CBC - Abnormal; Notable for the following components:      Result Value   WBC 11.8 (*)    All other components within normal limits  URINALYSIS, ROUTINE W REFLEX MICROSCOPIC - Abnormal; Notable for the following components:   Color, Urine YELLOW (*)    APPearance HAZY (*)    Hgb urine dipstick LARGE (*)    All other components within normal limits  BASIC  METABOLIC PANEL  TSH  POC URINE PREG, ED  TYPE AND SCREEN     EKG     RADIOLOGY Ultrasound of the pelvis    PROCEDURES:   Procedures   MEDICATIONS ORDERED IN ED: Medications - No data to display   IMPRESSION / MDM / ASSESSMENT AND PLAN / ED COURSE  I reviewed the triage vital signs and the nursing notes.                              Differential diagnosis includes, but is not limited to, menorrhagia, hemorrhage, retained products, field pregnancy, ectopic, ovarian cyst, hypothyroidism  Patient's presentation is most consistent with acute presentation with potential threat to life or bodily function.   Patient's labs are reassuring, do not feel that patient is hemorrhaging.  I did add POC pregnancy, urinalysis and TSH level  Ultrasound pelvis complete  Labs are reassuring and do not indicate hemorrhage, pregnancy,  or hypothyroidism  Ultrasound of the pelvis was independently reviewed and interpreted by me.  Did review the radiologist report.  States thickening of the endometrium.  If the bleeding does not resolve with medication may need a biopsy.  I did explain this to the patient.  She is to follow-up with her GYN doctor.  She was given a prescription for Provera.  Return emergency department if she is worsening.  Feel that she is stable to be discharged as her hemoglobin has remained stable.  She is discharged in stable condition.       FINAL CLINICAL IMPRESSION(S) / ED DIAGNOSES   Final diagnoses:  Dysfunctional uterine bleeding     Rx / DC Orders   ED Discharge Orders          Ordered    medroxyPROGESTERone (PROVERA) 5 MG tablet  Daily        05/14/22 1730             Note:  This document was prepared using Dragon voice recognition software and may include unintentional dictation errors.    Faythe Ghee, PA-C 05/14/22 1917    Merwyn Katos, MD 05/15/22 [place]

## 2022-05-14 NOTE — ED Triage Notes (Signed)
Pt to ED accompanied by 3 small children, pt here to be evaluated for profuse vaginal bleeding, states saturating 3 tampons and 1 pad per hour for the last 36 hours. Pt is ambulatory. Pt has small clots. Denies dizziness except if stands up fast. LNMP was about 2.5 weeks ago.   Bleeding started Sat night. 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 Pt had 1L of LR IVF yesterday at work.  Hx PCOS.

## 2022-05-28 ENCOUNTER — Other Ambulatory Visit (HOSPITAL_COMMUNITY): Payer: Self-pay

## 2022-05-28 MED ORDER — LISDEXAMFETAMINE DIMESYLATE 30 MG PO CAPS
30.0000 mg | ORAL_CAPSULE | Freq: Every day | ORAL | 0 refills | Status: DC
  Filled 2022-06-04: qty 30, 30d supply, fill #0

## 2022-06-04 ENCOUNTER — Other Ambulatory Visit (HOSPITAL_COMMUNITY): Payer: Self-pay

## 2022-07-11 ENCOUNTER — Other Ambulatory Visit (HOSPITAL_COMMUNITY): Payer: Self-pay

## 2022-07-11 MED ORDER — LISDEXAMFETAMINE DIMESYLATE 30 MG PO CAPS
30.0000 mg | ORAL_CAPSULE | Freq: Every day | ORAL | 0 refills | Status: DC
  Filled 2022-07-11: qty 30, 30d supply, fill #0

## 2022-07-27 ENCOUNTER — Inpatient Hospital Stay (HOSPITAL_COMMUNITY): Payer: Managed Care, Other (non HMO)

## 2022-07-27 ENCOUNTER — Inpatient Hospital Stay (HOSPITAL_COMMUNITY)
Admission: AD | Admit: 2022-07-27 | Discharge: 2022-07-27 | Disposition: A | Discharge status: Home / Self Care | Payer: Managed Care, Other (non HMO) | Attending: Obstetrics & Gynecology | Admitting: Obstetrics & Gynecology

## 2022-07-27 ENCOUNTER — Encounter (HOSPITAL_COMMUNITY): Payer: Self-pay

## 2022-07-27 DIAGNOSIS — Z3A01 Less than 8 weeks gestation of pregnancy: Secondary | ICD-10-CM | POA: Diagnosis not present

## 2022-07-27 DIAGNOSIS — R519 Headache, unspecified: Secondary | ICD-10-CM | POA: Diagnosis not present

## 2022-07-27 DIAGNOSIS — O209 Hemorrhage in early pregnancy, unspecified: Secondary | ICD-10-CM | POA: Diagnosis present

## 2022-07-27 DIAGNOSIS — O26891 Other specified pregnancy related conditions, first trimester: Secondary | ICD-10-CM | POA: Insufficient documentation

## 2022-07-27 LAB — WET PREP, GENITAL
Clue Cells Wet Prep HPF POC: NONE SEEN
Sperm: NONE SEEN
Trich, Wet Prep: NONE SEEN
WBC, Wet Prep HPF POC: >=10 — AB (ref <10)
Yeast Wet Prep HPF POC: NONE SEEN

## 2022-07-27 LAB — URINALYSIS, ROUTINE W REFLEX MICROSCOPIC
Bilirubin Urine: NEGATIVE
Glucose, UA: NEGATIVE mg/dL
Ketones, ur: NEGATIVE mg/dL
Leukocytes,Ua: NEGATIVE
Nitrite: NEGATIVE
Protein, ur: NEGATIVE mg/dL
Specific Gravity, Urine: 1.023 (ref 1.005–1.030)
pH: 6 (ref 5.0–8.0)

## 2022-07-27 LAB — CBC
HCT: 40 % (ref 36.0–46.0)
Hemoglobin: 13.4 g/dL (ref 12.0–15.0)
MCH: 29 pg (ref 26.0–34.0)
MCHC: 33.5 g/dL (ref 30.0–36.0)
MCV: 86.6 fL (ref 80.0–100.0)
Platelets: 293 10*3/uL (ref 150–400)
RBC: 4.62 MIL/uL (ref 3.87–5.11)
RDW: 12.8 % (ref 11.5–15.5)
WBC: 12.1 10*3/uL — ABNORMAL HIGH (ref 4.0–10.5)
nRBC: 0 % (ref 0.0–0.2)

## 2022-07-27 LAB — HCG, QUANTITATIVE, PREGNANCY: hCG, Beta Chain, Quant, S: 62713 m[IU]/mL — ABNORMAL HIGH (ref <5)

## 2022-07-27 MED ORDER — CYCLOBENZAPRINE HCL 10 MG PO TABS: 10.0000 mg | ORAL_TABLET | Freq: Two times a day (BID) | ORAL | 0 refills | Status: DC | PRN

## 2022-07-27 MED ORDER — CYCLOBENZAPRINE HCL 5 MG PO TABS
10.0000 mg | ORAL_TABLET | Freq: Once | ORAL | Status: AC
  Administered 2022-07-27: 10 mg via ORAL
  Filled 2022-07-27: qty 2

## 2022-07-27 NOTE — MAU Note (Addendum)
...  Angelica Kim is a 32 y.o. at Unknown here in MAU reporting: HA for the past 4 days. She reports she has tried Tylenol, Tylenol Sinus, and Fioricet. She reports she has been passing "pink and spongy discharge" since yesterday morning. She is also reporting left back pain "in the kidney area" that began this morning and is an aching pain. Denies recent IC. Denies vaginal itching and vaginal odors. Denies urinary sx's.  Last doses: Tylenol 1000 mg Has not taken anything else today - reports she did not want to waste the Fioricet since it was not working.  NPO: Solids - last night Fluids - Sipping body armor  LMP: 06/11/2022 Onset of complaint: x4 days Pain score: 6/10 HA - left temple and posterior  Lab orders placed from triage: POCT Preg, UA

## 2022-07-27 NOTE — MAU Provider Note (Signed)
History     CSN: [id number]  Arrival date and time: 07/27/22 1132   Event Date/Time   First Provider Initiated Contact with Patient 07/27/22 1215      Chief Complaint  Patient presents with   Headache   Vaginal Bleeding   HPI Angelica Kim is a 32 y.o. G4P1103 at [redacted]w[redacted]d by LMP who presents to MAU for headache and vaginal bleeding. She reports a headache behind her left eye that radiates into the back of her head and down her neck that started 4 days ago. She reports a history of migraines and typically takes Tylenol, Tylenol sinus, or Fioricet. She has tried all of these medications, last took Tylenol around 1015, but nothing has helped. She has also tried increasing fluids and resting which also have not helped. She denies other symptoms. She says the headache "feels like the headaches I get when my BP is elevated". She denies aura, flashes of light, ringing in the ears, congestion or runny nose.   She is also reporting some "pink, spongy discharge" that she noticed yesterday. This only occurs when she wipes and is not every time she goes to the bathroom. She denies itching, odor, or urinary s/s. She has had some achy low back pain but no abdominal pain. LMP was 06/11/2022.  OB History     Gravida  4   Para  3   Term  1   Preterm  1   AB  0   Living  3      SAB  0   IAB  0   Ectopic  0   Multiple  0   Live Births  3           Past Medical History:  Diagnosis Date   Ankylosing spondylitis (HCC)    Asthma    Fibromyalgia    History of pre-eclampsia    History of premature delivery 02/07/2020   Kidney stones    Migraine    Ovarian cyst    Preterm labor     Past Surgical History:  Procedure Laterality Date   COLONOSCOPY     ESOPHAGOGASTRODUODENOSCOPY ENDOSCOPY     spinal tap     WISDOM TOOTH EXTRACTION     WISDOM TOOTH EXTRACTION  2010    Family History  Problem Relation Age of Onset   Arthritis Mother    Deep vein thrombosis Mother    Kidney  Stones Maternal Grandmother    Hypertension Maternal Grandmother    Heart disease Maternal Grandfather     Social History   Tobacco Use   Smoking status: Former    Types: Cigarettes    Quit date: 2014    Years since quitting: 10.4   Smokeless tobacco: Never  Vaping Use   Vaping Use: Never used  Substance Use Topics   Alcohol use: Not Currently   Drug use: No    Allergies:  Allergies  Allergen Reactions   Amoxicillin Hives, Shortness Of Breath and Swelling    Swelling of hands, face, and lip Has patient had a PCN reaction causing immediate rash, facial/tongue/throat swelling, SOB or lightheadedness with hypotension: Unknown Has patient had a PCN reaction causing severe rash involving mucus membranes or skin necrosis: Yes Has patient had a PCN reaction that required hospitalization: No Has patient had a PCN reaction occurring within the last 10 years: No If all of the above answers are "NO", then may proceed with Cephalosporin use.    Zofran [Ondansetron Hcl] Nausea Only  No medications prior to admission.   Review of Systems  Genitourinary:  Positive for vaginal bleeding (spotting).  Musculoskeletal:  Positive for back pain.  Neurological:  Positive for headaches.   Physical Exam   Blood pressure 115/79, pulse 86, temperature 98.2 F (36.8 C), temperature source Oral, resp. rate 16, height 5' (1.524 m), weight 77.7 kg, last menstrual period 06/11/2022, SpO2 99 %, not currently breastfeeding.  Physical Exam Vitals and nursing note reviewed. Exam conducted with a chaperone present.  Constitutional:      General: She is not in acute distress. Eyes:     Extraocular Movements: Extraocular movements intact.     Pupils: Pupils are equal, round, and reactive to light.  Cardiovascular:     Rate and Rhythm: Normal rate.  Pulmonary:     Effort: Pulmonary effort is normal. No respiratory distress.  Abdominal:     Palpations: Abdomen is soft.     Tenderness: There is no  abdominal tenderness. There is no right CVA tenderness or left CVA tenderness.  Genitourinary:       Comments: Normal external female genitalia, vaginal walls pink and well-rugated, no bright red blood, scant brown tinged discharge, cervical polyp noted but otherwise closed and unremarkable Musculoskeletal:        General: Normal range of motion.     Cervical back: Normal range of motion.  Skin:    General: Skin is warm and dry.  Neurological:     General: No focal deficit present.     Mental Status: She is alert and oriented to person, place, and time.  Psychiatric:        Mood and Affect: Mood normal.        Behavior: Behavior normal.     Results for orders placed or performed during the hospital encounter of 07/27/22 (from the past 24 hour(s))  Urinalysis, Routine w reflex microscopic -Urine, Clean Catch     Status: Abnormal   Collection Time: 07/27/22 11:58 AM  Result Value Ref Range   Color, Urine YELLOW YELLOW   APPearance HAZY (A) CLEAR   Specific Gravity, Urine 1.023 1.005 - 1.030   pH 6.0 5.0 - 8.0   Glucose, UA NEGATIVE NEGATIVE mg/dL   Hgb urine dipstick SMALL (A) NEGATIVE   Bilirubin Urine NEGATIVE NEGATIVE   Ketones, ur NEGATIVE NEGATIVE mg/dL   Protein, ur NEGATIVE NEGATIVE mg/dL   Nitrite NEGATIVE NEGATIVE   Leukocytes,Ua NEGATIVE NEGATIVE   RBC / HPF 0-5 0 - 5 RBC/hpf   WBC, UA 0-5 0 - 5 WBC/hpf   Bacteria, UA RARE (A) NONE SEEN   Squamous Epithelial / HPF 0-5 0 - 5 /HPF   Mucus PRESENT   CBC     Status: Abnormal   Collection Time: 07/27/22 12:24 PM  Result Value Ref Range   WBC 12.1 (H) 4.0 - 10.5 K/uL   RBC 4.62 3.87 - 5.11 MIL/uL   Hemoglobin 13.4 12.0 - 15.0 g/dL   HCT 16.1 09.6 - 04.5 %   MCV 86.6 80.0 - 100.0 fL   MCH 29.0 26.0 - 34.0 pg   MCHC 33.5 30.0 - 36.0 g/dL   RDW 40.9 81.1 - 91.4 %   Platelets 293 150 - 400 K/uL   nRBC 0.0 0.0 - 0.2 %  hCG, quantitative, pregnancy     Status: Abnormal   Collection Time: 07/27/22 12:24 PM  Result  Value Ref Range   hCG, Beta Chain, Quant, S 62,713 (H) <5 mIU/mL  Wet prep, genital  Status: Abnormal   Collection Time: 07/27/22 12:31 PM  Result Value Ref Range   Yeast Wet Prep HPF POC NONE SEEN NONE SEEN   Trich, Wet Prep NONE SEEN NONE SEEN   Clue Cells Wet Prep HPF POC NONE SEEN NONE SEEN   WBC, Wet Prep HPF POC >=10 (A) <10   Sperm NONE SEEN    US OB LESS THAN 14 WEEKS WITH OB TRANSVAGINAL  Result Date: 07/27/2022 CLINICAL DATA:  Vaginal bleeding. EXAM: OBSTETRIC <14 WK [place] AND TRANSVAGINAL OB US TECHNIQUE: Both transabdominal and transvaginal ultrasound examinations were performed for complete evaluation of the gestation as well as the maternal uterus, adnexal regions, and pelvic cul-de-sac. Transvaginal technique was performed to assess early pregnancy. COMPARISON:  05/14/22 FINDINGS: Intrauterine gestational sac: Single Yolk sac:  Visualized. Embryo:  Visualized. Cardiac Activity: Visualized. Heart Rate: 116  bpm CRL: 3.3  mm   6 w   0 d                  [place] EDC: 03/22/23 Subchorionic hemorrhage:  None visualized. Maternal uterus/adnexae: Right Ovary measures 2.1 x 1.8 x 2.3 cm and left measures 4.9 x 2.6 x 2.9 cm. Bilateral small follicles. Trace free fluid. IMPRESSION: Single live intra uterine pregnancy of 6 weeks and 0 days with positive fetal heart motion. Electronically Signed   By: Karen Kays M.D.   On: 07/27/2022 13:31    MAU Course  Procedures  MDM UA CBC, HCG Wet prep, GC/CT [place] Flexeril  UA with some blood-likely from vagina but will send culture given some bacteria. Wet prep negative, GC/CT pending. US shows SIUP with FHR present. Suspect bleeding likely due to cervical polyp however not bleeding today.  Patient given Flexeril for headache which she reports did improve to 3/10.   Assessment and Plan   1. [redacted] weeks gestation of pregnancy   2. Vaginal bleeding affecting early pregnancy   3. Headache in pregnancy, antepartum, first trimester    - Discharge home in  stable condition - Rx for Flexeril - Pelvic rest and bleeding precautions. Return to MAU as needed - Establish PNC at [facility] as desired  Brand Males, CNM 07/27/2022, 2:30 PM

## 2022-07-28 LAB — CULTURE, OB URINE: Culture: 10000 — AB

## 2022-07-30 LAB — GC/CHLAMYDIA PROBE AMP (~~LOC~~) NOT AT ARMC
Chlamydia: NEGATIVE
Comment: NEGATIVE
Comment: NORMAL
Neisseria Gonorrhea: NEGATIVE

## 2022-08-18 ENCOUNTER — Inpatient Hospital Stay (HOSPITAL_COMMUNITY)
Admission: AD | Admit: 2022-08-18 | Discharge: 2022-08-18 | Disposition: A | Discharge status: Home / Self Care | Payer: Managed Care, Other (non HMO) | Attending: Obstetrics & Gynecology | Admitting: Obstetrics & Gynecology

## 2022-08-18 ENCOUNTER — Encounter (HOSPITAL_COMMUNITY): Payer: Self-pay | Admitting: Obstetrics & Gynecology

## 2022-08-18 ENCOUNTER — Inpatient Hospital Stay (HOSPITAL_COMMUNITY): Payer: Managed Care, Other (non HMO)

## 2022-08-18 DIAGNOSIS — O09211 Supervision of pregnancy with history of pre-term labor, first trimester: Secondary | ICD-10-CM | POA: Insufficient documentation

## 2022-08-18 DIAGNOSIS — O99511 Diseases of the respiratory system complicating pregnancy, first trimester: Secondary | ICD-10-CM | POA: Diagnosis not present

## 2022-08-18 DIAGNOSIS — O09291 Supervision of pregnancy with other poor reproductive or obstetric history, first trimester: Secondary | ICD-10-CM | POA: Diagnosis not present

## 2022-08-18 DIAGNOSIS — R109 Unspecified abdominal pain: Secondary | ICD-10-CM | POA: Insufficient documentation

## 2022-08-18 DIAGNOSIS — O99111 Other diseases of the blood and blood-forming organs and certain disorders involving the immune mechanism complicating pregnancy, first trimester: Secondary | ICD-10-CM | POA: Insufficient documentation

## 2022-08-18 DIAGNOSIS — O26891 Other specified pregnancy related conditions, first trimester: Secondary | ICD-10-CM | POA: Insufficient documentation

## 2022-08-18 DIAGNOSIS — D72829 Elevated white blood cell count, unspecified: Secondary | ICD-10-CM

## 2022-08-18 DIAGNOSIS — Z3A09 9 weeks gestation of pregnancy: Secondary | ICD-10-CM | POA: Diagnosis not present

## 2022-08-18 LAB — CBC WITH DIFFERENTIAL/PLATELET
Abs Immature Granulocytes: 0.05 10*3/uL (ref 0.00–0.07)
Basophils Absolute: 0.1 10*3/uL (ref 0.0–0.1)
Basophils Relative: 1 %
Eosinophils Absolute: 0.2 10*3/uL (ref 0.0–0.5)
Eosinophils Relative: 1 %
HCT: 35.1 % — ABNORMAL LOW (ref 36.0–46.0)
Hemoglobin: 11.6 g/dL — ABNORMAL LOW (ref 12.0–15.0)
Immature Granulocytes: 0 %
Lymphocytes Relative: 18 %
Lymphs Abs: 2.3 10*3/uL (ref 0.7–4.0)
MCH: 28.1 pg (ref 26.0–34.0)
MCHC: 33 g/dL (ref 30.0–36.0)
MCV: 85 fL (ref 80.0–100.0)
Monocytes Absolute: 0.8 10*3/uL (ref 0.1–1.0)
Monocytes Relative: 7 %
Neutro Abs: 9.4 10*3/uL — ABNORMAL HIGH (ref 1.7–7.7)
Neutrophils Relative %: 73 %
Platelets: 260 10*3/uL (ref 150–400)
RBC: 4.13 MIL/uL (ref 3.87–5.11)
RDW: 13.2 % (ref 11.5–15.5)
WBC: 12.8 10*3/uL — ABNORMAL HIGH (ref 4.0–10.5)
nRBC: 0 % (ref 0.0–0.2)

## 2022-08-18 LAB — COMPREHENSIVE METABOLIC PANEL
ALT: 26 U/L (ref 0–44)
AST: 17 U/L (ref 15–41)
Albumin: 3.1 g/dL — ABNORMAL LOW (ref 3.5–5.0)
Alkaline Phosphatase: 72 U/L (ref 38–126)
Anion gap: 13 (ref 5–15)
BUN: 9 mg/dL (ref 6–20)
CO2: 22 mmol/L (ref 22–32)
Calcium: 9.1 mg/dL (ref 8.9–10.3)
Chloride: 100 mmol/L (ref 98–111)
Creatinine, Ser: 0.7 mg/dL (ref 0.44–1.00)
GFR, Estimated: >60 mL/min (ref >60)
Glucose, Bld: 98 mg/dL (ref 70–99)
Potassium: 4 mmol/L (ref 3.5–5.1)
Sodium: 135 mmol/L (ref 135–145)
Total Bilirubin: 0.3 mg/dL (ref 0.3–1.2)
Total Protein: 6.2 g/dL — ABNORMAL LOW (ref 6.5–8.1)

## 2022-08-18 LAB — URINALYSIS, ROUTINE W REFLEX MICROSCOPIC
Bilirubin Urine: NEGATIVE
Glucose, UA: NEGATIVE mg/dL
Hgb urine dipstick: NEGATIVE
Ketones, ur: NEGATIVE mg/dL
Leukocytes,Ua: NEGATIVE
Nitrite: NEGATIVE
Protein, ur: NEGATIVE mg/dL
Specific Gravity, Urine: 1.025 (ref 1.005–1.030)
pH: 6 (ref 5.0–8.0)

## 2022-08-18 MED ORDER — ONDANSETRON 4 MG PO TBDP
4.0000 mg | ORAL_TABLET | Freq: Once | ORAL | Status: AC
  Administered 2022-08-18: 4 mg via ORAL

## 2022-08-18 MED ORDER — HYDROMORPHONE HCL 2 MG PO TABS
2.0000 mg | ORAL_TABLET | Freq: Once | ORAL | Status: AC
  Administered 2022-08-18: 2 mg via ORAL
  Filled 2022-08-18: qty 1

## 2022-08-18 MED ORDER — CAPSAICIN 0.075 % EX CREA
TOPICAL_CREAM | Freq: Two times a day (BID) | CUTANEOUS | Status: DC
  Filled 2022-08-18: qty 57

## 2022-08-18 MED ORDER — OXYCODONE-ACETAMINOPHEN 5-325 MG PO TABS: 1.0000 | ORAL_TABLET | ORAL | 0 refills | Status: DC | PRN

## 2022-08-18 MED ORDER — ONDANSETRON 4 MG PO TBDP
ORAL_TABLET | ORAL | Status: AC
  Filled 2022-08-18: qty 1

## 2022-08-18 NOTE — MAU Provider Note (Signed)
History     CSN: [id number]  Arrival date and time: 08/18/22 0744   Event Date/Time   First Provider Initiated Contact with Patient 08/18/22 [phone]      Chief Complaint  Patient presents with   Flank Pain   Angelica Kim , a  32 y.o. [phone] at [redacted]w[redacted]d presents to MAU with complaints of left sided back pain since Thursday. Patient states that ir started off as a "dull fulness" and has gotten progressively worse over the last 3 days. She states pain with pressure or palpation applied to it a 9/10. Attempted PO Tylenol and Flexeril, with minimal relief. She also reports increasing her fluid intake, cranberry juice, lemonade, heat packs warm baths and no relief. She reports a history of kidney stones and reports that this feels similar. She denies nausea and vomiting, fever or chills. She also denies painful burning with urination and hematuria. She has no other complaints.          OB History     Gravida  4   Para  3   Term  1   Preterm  1   AB  0   Living  3      SAB  0   IAB  0   Ectopic  0   Multiple  0   Live Births  3           Past Medical History:  Diagnosis Date   Ankylosing spondylitis (HCC)    Asthma    Fibromyalgia    History of pre-eclampsia    History of premature delivery 02/07/2020   Kidney stones    Migraine    Ovarian cyst    Preterm labor     Past Surgical History:  Procedure Laterality Date   COLONOSCOPY     ESOPHAGOGASTRODUODENOSCOPY ENDOSCOPY     spinal tap     WISDOM TOOTH EXTRACTION     WISDOM TOOTH EXTRACTION  2010    Family History  Problem Relation Age of Onset   Arthritis Mother    Deep vein thrombosis Mother    Kidney Stones Maternal Grandmother    Hypertension Maternal Grandmother    Heart disease Maternal Grandfather     Social History   Tobacco Use   Smoking status: Former    Current packs/day: 0.00    Types: Cigarettes    Quit date: 2014    Years since quitting: 10.5   Smokeless tobacco: Never  Vaping  Use   Vaping status: Never Used  Substance Use Topics   Alcohol use: Not Currently   Drug use: No    Allergies:  Allergies  Allergen Reactions   Amoxicillin Hives, Shortness Of Breath and Swelling    Swelling of hands, face, and lip Has patient had a PCN reaction causing immediate rash, facial/tongue/throat swelling, SOB or lightheadedness with hypotension: Unknown Has patient had a PCN reaction causing severe rash involving mucus membranes or skin necrosis: Yes Has patient had a PCN reaction that required hospitalization: No Has patient had a PCN reaction occurring within the last 10 years: No If all of the above answers are "NO", then may proceed with Cephalosporin use.     Medications Prior to Admission  Medication Sig Dispense Refill Last Dose   acetaminophen (TYLENOL) 325 MG tablet Take 2 tablets (650 mg total) by mouth every 4 (four) hours as needed (for pain scale < 4). (Patient taking differently: Take 1,000 mg by mouth every 6 (six) hours as needed for headache (for  pain scale < 4).)   08/18/2022 at 0345   cyclobenzaprine (FLEXERIL) 10 MG tablet Take 1 tablet (10 mg total) by mouth 2 (two) times daily as needed for muscle spasms. 20 tablet 0 Past Week   mometasone (NASONEX) 50 MCG/ACT nasal spray Place 2 sprays into the nose daily.   08/18/2022   albuterol (PROVENTIL) (2.5 MG/3ML) 0.083% nebulizer solution Take 3 mLs (2.5 mg total) by nebulization every 6 (six) hours as needed for wheezing or shortness of breath. 360 mL 0    albuterol (VENTOLIN HFA) 108 (90 Base) MCG/ACT inhaler Inhale 2 puffs into the lungs every 6 (six) hours as needed for wheezing or shortness of breath. 1 each 0    amLODipine (NORVASC) 10 MG tablet Take 1 tablet (10 mg total) by mouth daily. 30 tablet 1    butalbital-acetaminophen-caffeine (FIORICET) 50-325-40 MG tablet Take 1 tablet by mouth 2 (two) times daily as needed for headache.      ferrous sulfate 325 (65 FE) MG tablet Take 1 tablet (325 mg total) by  mouth every other day. 30 tablet 1    fluticasone (FLONASE) 50 MCG/ACT nasal spray Place 2 sprays into both nostrils daily. 18 mL 0    ibuprofen (ADVIL) 600 MG tablet Take 1 tablet (600 mg total) by mouth every 6 (six) hours. 30 tablet 0    lisinopril (ZESTRIL) 20 MG tablet Take 1 tablet (20 mg total) by mouth daily for 60 doses. 30 tablet 1    Prenatal MV & Min w/FA-DHA (PRENATAL GUMMIES PO) Take by mouth.       Review of Systems  Constitutional:  Negative for chills, fatigue and fever.  Eyes:  Negative for pain and visual disturbance.  Respiratory:  Negative for apnea, shortness of breath and wheezing.   Cardiovascular:  Negative for chest pain and palpitations.  Gastrointestinal:  Negative for abdominal pain, constipation, diarrhea, nausea and vomiting.  Genitourinary:  Positive for flank pain and frequency. Negative for difficulty urinating, dysuria, pelvic pain, vaginal bleeding, vaginal discharge and vaginal pain.  Musculoskeletal:  Positive for back pain.  Neurological:  Negative for seizures, weakness and headaches.  Psychiatric/Behavioral:  Negative for suicidal ideas.    Physical Exam   Blood pressure 112/72, pulse 98, temperature 98.4 F (36.9 C), temperature source Oral, resp. rate 16, height 5' (1.524 m), weight 79.6 kg, last menstrual period 06/11/2022, SpO2 100%, not currently breastfeeding.  Physical Exam Vitals and nursing note reviewed.  Constitutional:      General: She is not in acute distress.    Appearance: Normal appearance.  HENT:     Head: Normocephalic.  Pulmonary:     Effort: Pulmonary effort is normal.  Abdominal:     Palpations: Abdomen is soft.     Tenderness: There is abdominal tenderness. There is left CVA tenderness. There is no right CVA tenderness.  Musculoskeletal:     Cervical back: Normal range of motion.  Skin:    General: Skin is warm and dry.  Neurological:     Mental Status: She is alert and oriented to person, place, and time.   Psychiatric:        Mood and Affect: Mood normal.     MAU Course  Procedures Orders Placed This Encounter  Procedures   US RENAL   Urinalysis, Routine w reflex microscopic -Urine, Clean Catch   CBC with Differential/Platelet   Comprehensive metabolic panel   Meds ordered this encounter  Medications   HYDROmorphone (DILAUDID) tablet 2 mg  ondansetron (ZOFRAN-ODT) disintegrating tablet 4 mg   ondansetron (ZOFRAN-ODT) 4 MG disintegrating tablet    Lynford Humphrey, Whitney L: cabinet override   Results for orders placed or performed during the hospital encounter of 08/18/22 (from the past 24 hour(s))  Urinalysis, Routine w reflex microscopic -Urine, Clean Catch     Status: Abnormal   Collection Time: 08/18/22  8:12 AM  Result Value Ref Range   Color, Urine YELLOW YELLOW   APPearance HAZY (A) CLEAR   Specific Gravity, Urine 1.025 1.005 - 1.030   pH 6.0 5.0 - 8.0   Glucose, UA NEGATIVE NEGATIVE mg/dL   Hgb urine dipstick NEGATIVE NEGATIVE   Bilirubin Urine NEGATIVE NEGATIVE   Ketones, ur NEGATIVE NEGATIVE mg/dL   Protein, ur NEGATIVE NEGATIVE mg/dL   Nitrite NEGATIVE NEGATIVE   Leukocytes,Ua NEGATIVE NEGATIVE  CBC with Differential/Platelet     Status: Abnormal   Collection Time: 08/18/22  9:16 AM  Result Value Ref Range   WBC 12.8 (H) 4.0 - 10.5 K/uL   RBC 4.13 3.87 - 5.11 MIL/uL   Hemoglobin 11.6 (L) 12.0 - 15.0 g/dL   HCT 20.2 (L) 54.2 - 70.6 %   MCV 85.0 80.0 - 100.0 fL   MCH 28.1 26.0 - 34.0 pg   MCHC 33.0 30.0 - 36.0 g/dL   RDW 23.7 62.8 - 31.5 %   Platelets 260 150 - 400 K/uL   nRBC 0.0 0.0 - 0.2 %   Neutrophils Relative % 73 %   Neutro Abs 9.4 (H) 1.7 - 7.7 K/uL   Lymphocytes Relative 18 %   Lymphs Abs 2.3 0.7 - 4.0 K/uL   Monocytes Relative 7 %   Monocytes Absolute 0.8 0.1 - 1.0 K/uL   Eosinophils Relative 1 %   Eosinophils Absolute 0.2 0.0 - 0.5 K/uL   Basophils Relative 1 %   Basophils Absolute 0.1 0.0 - 0.1 K/uL   Immature Granulocytes 0 %   Abs Immature  Granulocytes 0.05 0.00 - 0.07 K/uL  Comprehensive metabolic panel     Status: Abnormal   Collection Time: 08/18/22  9:16 AM  Result Value Ref Range   Sodium 135 135 - 145 mmol/L   Potassium 4.0 3.5 - 5.1 mmol/L   Chloride 100 98 - 111 mmol/L   CO2 22 22 - 32 mmol/L   Glucose, Bld 98 70 - 99 mg/dL   BUN 9 6 - 20 mg/dL   Creatinine, Ser 1.76 0.44 - 1.00 mg/dL   Calcium 9.1 8.9 - 16.0 mg/dL   Total Protein 6.2 (L) 6.5 - 8.1 g/dL   Albumin 3.1 (L) 3.5 - 5.0 g/dL   AST 17 15 - 41 U/L   ALT 26 0 - 44 U/L   Alkaline Phosphatase 72 38 - 126 U/L   Total Bilirubin 0.3 0.3 - 1.2 mg/dL   GFR, Estimated >73 >71 mL/min   Anion gap 13 5 - 15   US RENAL  Result Date: 08/18/2022 CLINICAL DATA:  Left flank pain.  First trimester pregnancy. EXAM: RENAL / URINARY TRACT ULTRASOUND COMPLETE COMPARISON:  Renal ultrasound 04/04/2020. Abdominopelvic CT 11/06/2020. FINDINGS: Right Kidney: Renal measurements: 13.1 x 4.9 x 5.6 cm = volume: 185.9 mL. Echogenicity within normal limits. No mass or hydronephrosis visualized. Left Kidney: Renal measurements: 12.5 x 4.7 x 4.5 cm = volume: 136.5 mL. Echogenicity within normal limits. No mass or hydronephrosis visualized. Bladder: Appears normal for degree of bladder distention. Other: None. IMPRESSION: Unremarkable renal ultrasound. Electronically Signed   By: Carey Bullocks  M.D.   On: 08/18/2022 09:44    MDM - Cliffton Asters count mildly elevated at 12.6. Could be early signs of infection, vs expected finding in early pregnancy.  - UA hazy otherwise Normal   - [place] noted normal kidneys no signs of kidney stones  - Kidney function normal on CMP  - CNM reviewed previous white cell counts and patients baseline is about 12~ baseline.  - plan for discharge.   Assessment and Plan   1. Left flank pain   2. Leukocytosis, unspecified type   3. [redacted] weeks gestation of pregnancy    - Worsening signs and return precautions reviewed.  - Encouraged rest, warm packs and Tylenol as needed  for discomfort.  - Patient discharged home in stable condition and mat return to MAU as needed .   Claudette Head, MSN CNM  08/18/2022, 10:57 AM

## 2022-08-18 NOTE — MAU Note (Addendum)
Angelica Kim is a 32 y.o. at [redacted]w[redacted]d here in MAU reporting: hx of kidney stones, left kidney area has been hurting the past couple days.  Has been trying to increase fluids to flush it, but the pain is getting worse.  Can't even lay on that side. Denies fever No urinary pain, just increase flank pain.  Onset of complaint: couple days ago Pain score: 6 Vitals:   08/18/22 0759  BP: 124/77  Pulse: 98  Resp: 16  Temp: 98.4 F (36.9 C)  SpO2: 100%      Lab orders placed from triage:  urine

## 2022-08-19 LAB — CULTURE, OB URINE

## 2022-09-17 ENCOUNTER — Ambulatory Visit
Admission: EM | Admit: 2022-09-17 | Discharge: 2022-09-17 | Disposition: A | Discharge status: Home / Self Care | Payer: Managed Care, Other (non HMO) | Attending: Internal Medicine | Admitting: Internal Medicine

## 2022-09-17 ENCOUNTER — Encounter: Payer: Self-pay | Admitting: Emergency Medicine

## 2022-09-17 DIAGNOSIS — J069 Acute upper respiratory infection, unspecified: Secondary | ICD-10-CM | POA: Diagnosis not present

## 2022-09-17 MED ORDER — DEXTROMETHORPHAN HBR 15 MG/5ML PO SYRP: 10.0000 mL | ORAL_SOLUTION | Freq: Four times a day (QID) | ORAL | 0 refills | Status: DC | PRN

## 2022-09-17 NOTE — Discharge Instructions (Signed)
It appears that you have a viral illness that should run its course.  I have prescribed you a cough medication to take as needed.  May take Tylenol as needed as well.  Continue your Nasonex to help with fluid in ears and add cetirizine.  Follow-up if symptoms persist or worsen.

## 2022-09-17 NOTE — ED Triage Notes (Signed)
Pt presents with cough and chest congestion xs 3 days. States has taken 3 COVID test with negative results. States also has decreased hearing if left ear.

## 2022-09-17 NOTE — ED Provider Notes (Signed)
[name]    CSN: [id number] Arrival date & time: 09/17/22  1027      History   Chief Complaint Chief Complaint  Patient presents with   Cough   Nasal Congestion    HPI Angelica Kim is a 32 y.o. female.   Patient presents with cough, nasal congestion, feelings of chest congestion, left ear discomfort that started about 3 to 4 days ago.  Denies any obvious fever.  Her son has similar symptoms.  Reports that she took 3 COVID tests at home that were negative.  Reports that she has a history of asthma and uses a daily inhaler for this.  Reports that she has not needed her rescue inhaler since symptoms started.  Patient not reporting any shortness of breath.  Reports chest pain with coughing.  States that she is currently [redacted] weeks pregnant.  She has taken Robitussin and Tylenol over-the-counter.   Cough   Past Medical History:  Diagnosis Date   Ankylosing spondylitis (HCC)    Asthma    Fibromyalgia    History of pre-eclampsia    History of premature delivery 02/07/2020   Kidney stones    Migraine    Ovarian cyst    Preterm labor     Patient Active Problem List   Diagnosis Date Noted   SVD (spontaneous vaginal delivery) 12/09/2021   Hypertension in pregnancy, preeclampsia, severe, third trimester 12/08/2021   Gestational HTN, third trimester 12/07/2021   Gestational hypertension 08/03/2020   Allergy to amoxicillin 02/07/2020   Generalized anxiety disorder 04/07/2014   Migraine 12/21/2013    Past Surgical History:  Procedure Laterality Date   COLONOSCOPY     ESOPHAGOGASTRODUODENOSCOPY ENDOSCOPY     spinal tap     WISDOM TOOTH EXTRACTION     WISDOM TOOTH EXTRACTION  2010    OB History     Gravida  4   Para  3   Term  1   Preterm  1   AB  0   Living  3      SAB  0   IAB  0   Ectopic  0   Multiple  0   Live Births  3            Home Medications    Prior to Admission medications   Medication Sig Start Date End Date  Taking? Authorizing Provider  dextromethorphan 15 MG/5ML syrup Take 10 mLs (30 mg total) by mouth every 6 (six) hours as needed for cough. 09/17/22  Yes Rita Vialpando, Acie Fredrickson, FNP  acetaminophen (TYLENOL) 325 MG tablet Take 2 tablets (650 mg total) by mouth every 4 (four) hours as needed (for pain scale < 4). Patient taking differently: Take 1,000 mg by mouth every 6 (six) hours as needed for headache (for pain scale < 4). 08/05/20   Holshouser, Cala Bradford B, CNM  albuterol (PROVENTIL) (2.5 MG/3ML) 0.083% nebulizer solution Take 3 mLs (2.5 mg total) by nebulization every 6 (six) hours as needed for wheezing or shortness of breath. 02/23/21 03/25/21  Theadora Rama Scales, PA-C  albuterol (VENTOLIN HFA) 108 (90 Base) MCG/ACT inhaler Inhale 2 puffs into the lungs every 6 (six) hours as needed for wheezing or shortness of breath. 10/30/21   Ellsworth Lennox, PA-C  amLODipine (NORVASC) 10 MG tablet Take 1 tablet (10 mg total) by mouth daily. 12/11/21 02/09/22  Hoover Browns, MD  butalbital-acetaminophen-caffeine (FIORICET) 50-325-40 MG tablet Take 1 tablet by mouth 2 (two) times daily as needed for headache.  [provider]  cyclobenzaprine (FLEXERIL) 10 MG tablet Take 1 tablet (10 mg total) by mouth 2 (two) times daily as needed for muscle spasms. 07/27/22   Brand Males, CNM  ferrous sulfate 325 (65 FE) MG tablet Take 1 tablet (325 mg total) by mouth every other day. 08/05/20   Holshouser, [name], CNM  fluticasone (FLONASE) 50 MCG/ACT nasal spray Place 2 sprays into both nostrils daily. 02/23/21   Theadora Rama Scales, PA-C  ibuprofen (ADVIL) 600 MG tablet Take 1 tablet (600 mg total) by mouth every 6 (six) hours. 12/10/21   Hoover Browns, MD  lisinopril (ZESTRIL) 20 MG tablet Take 1 tablet (20 mg total) by mouth daily for 60 doses. 12/11/21 02/09/22  Hoover Browns, MD  mometasone (NASONEX) 50 MCG/ACT nasal spray Place 2 sprays into the nose daily.    [provider]  oxyCODONE-acetaminophen (PERCOCET)  5-325 MG tablet Take 1 tablet by mouth every 4 (four) hours as needed for severe pain. 08/18/22   Carlynn Herald, CNM  Prenatal MV & Min w/FA-DHA (PRENATAL GUMMIES PO) Take by mouth.    [provider]  metoCLOPramide (REGLAN) 10 MG tablet Take 1 tablet (10 mg total) by mouth every 6 (six) hours. 03/22/17 07/31/18  [name], Coral Else, PA-C    Family History Family History  Problem Relation Age of Onset   Arthritis Mother    Deep vein thrombosis Mother    Kidney Stones Maternal Grandmother    Hypertension Maternal Grandmother    Heart disease Maternal Grandfather     Social History Social History   Tobacco Use   Smoking status: Former    Current packs/day: 0.00    Types: Cigarettes    Quit date: 2014    Years since quitting: 10.6   Smokeless tobacco: Never  Vaping Use   Vaping status: Never Used  Substance Use Topics   Alcohol use: Not Currently   Drug use: No     Allergies   Amoxicillin   Review of Systems Review of Systems Per HPI  Physical Exam Triage Vital Signs ED Triage Vitals  Encounter Vitals Group     BP 09/17/22 1049 108/76     Systolic BP Percentile --      Diastolic BP Percentile --      Pulse Rate 09/17/22 1049 (!) 103     Resp 09/17/22 1049 18     Temp 09/17/22 1049 98.4 F (36.9 C)     Temp Source 09/17/22 1049 Oral     SpO2 09/17/22 1049 98 %     Weight --      Height --      Head Circumference --      Peak Flow --      Pain Score 09/17/22 1048 3     Pain Loc --      Pain Education --      Exclude from Growth Chart --    No data found.  Updated Vital Signs BP 108/76 (BP Location: Left Arm)   Pulse (!) 103   Temp 98.4 F (36.9 C) (Oral)   Resp 18   LMP 06/11/2022 (Exact Date)   SpO2 98%   Visual Acuity Right Eye Distance:   Left Eye Distance:   Bilateral Distance:    Right Eye Near:   Left Eye Near:    Bilateral Near:     Physical Exam Constitutional:      General: She is not in acute distress.    Appearance:  Normal appearance. She is not toxic-appearing or diaphoretic.  HENT:     Head: Normocephalic and atraumatic.     Right Ear: Ear canal normal. A middle ear effusion is present. Tympanic membrane is not perforated, erythematous or bulging.     Left Ear: Ear canal normal. A middle ear effusion is present. Tympanic membrane is not perforated, erythematous or bulging.     Nose: Congestion present.     Mouth/Throat:     Mouth: Mucous membranes are moist.     Pharynx: No posterior oropharyngeal erythema.  Eyes:     Extraocular Movements: Extraocular movements intact.     Conjunctiva/sclera: Conjunctivae normal.     Pupils: Pupils are equal, round, and reactive to light.  Cardiovascular:     Rate and Rhythm: Normal rate and regular rhythm.     Pulses: Normal pulses.     Heart sounds: Normal heart sounds.  Pulmonary:     Effort: Pulmonary effort is normal. No respiratory distress.     Breath sounds: Normal breath sounds. No wheezing.  Abdominal:     General: Abdomen is flat. Bowel sounds are normal.     Palpations: Abdomen is soft.  Musculoskeletal:        General: Normal range of motion.     Cervical back: Normal range of motion.  Skin:    General: Skin is warm and dry.  Neurological:     General: No focal deficit present.     Mental Status: She is alert and oriented to person, place, and time. Mental status is at baseline.  Psychiatric:        Mood and Affect: Mood normal.        Behavior: Behavior normal.      UC Treatments / Results  Labs (all labs ordered are listed, but only abnormal results are displayed) Labs Reviewed - No data to display  EKG   Radiology No results found.  Procedures Procedures (including critical care time)  Medications Ordered in UC Medications - No data to display  Initial Impression / Assessment and Plan / UC Course  I have reviewed the triage vital signs and the nursing notes.  Pertinent labs & imaging results that were available during  my care of the patient were reviewed by me and considered in my medical decision making (see chart for details).     Patient presents with symptoms likely from a viral upper respiratory infection. Do not suspect underlying cardiopulmonary process. Symptoms seem unlikely related to ACS, CHF or COPD exacerbations, pneumonia, pneumothorax. Patient is nontoxic appearing and not in need of emergent medical intervention.  COVID testing deferred given patient had negative COVID test at home.  Flu testing deferred given duration of symptoms as it would not change treatment.  Do not have RSV testing here in urgent care.  No obvious signs of asthma exacerbation on exam.  Recommended symptom control with over the counter medications that are safe with pregnancy.  Will prescribe dextromethorphan to take as needed for cough as this is considered safe with pregnancy.  Patient advised to continue Nasonex and to add cetirizine as she denies that she takes this daily as this will help with fluid behind TM's.  Advised adequate fluid hydration, rest, Tylenol as needed.  Return if symptoms fail to improve. Patient states understanding and is agreeable.  Discharged with PCP followup.  Final Clinical Impressions(s) / UC Diagnoses   Final diagnoses:  Viral upper respiratory tract infection with cough     Discharge Instructions  It appears that you have a viral illness that should run its course.  I have prescribed you a cough medication to take as needed.  May take Tylenol as needed as well.  Continue your Nasonex to help with fluid in ears and add cetirizine.  Follow-up if symptoms persist or worsen.    ED Prescriptions     Medication Sig Dispense Auth. Provider   dextromethorphan 15 MG/5ML syrup Take 10 mLs (30 mg total) by mouth every 6 (six) hours as needed for cough. 120 mL Gustavus Bryant, [place]      PDMP not reviewed this encounter.   Gustavus Bryant, [place] 09/17/22 1120

## 2022-09-20 ENCOUNTER — Ambulatory Visit
Admission: EM | Admit: 2022-09-20 | Discharge: 2022-09-20 | Disposition: A | Discharge status: Home / Self Care | Payer: Managed Care, Other (non HMO)

## 2022-09-20 DIAGNOSIS — H65192 Other acute nonsuppurative otitis media, left ear: Secondary | ICD-10-CM

## 2022-09-20 MED ORDER — CEPHALEXIN 500 MG PO CAPS: 500.0000 mg | ORAL_CAPSULE | Freq: Two times a day (BID) | ORAL | 0 refills | Status: AC

## 2022-09-20 NOTE — ED Provider Notes (Signed)
[name]    CSN: [id number] Arrival date & time: 09/20/22  1112      History   Chief Complaint Chief Complaint  Patient presents with   Follow-up    HPI Angelica Kim is a 32 y.o. female.   Patient here today for evaluation of left ear pain that started several days ago but is worsene since being seen 2 days ago in office.  She has been using Nasonex as recommended without resolution.  She has not had fever.  She does feel off balance at times.  The history is provided by the patient.    Past Medical History:  Diagnosis Date   Ankylosing spondylitis (HCC)    Asthma    Fibromyalgia    History of pre-eclampsia    History of premature delivery 02/07/2020   Kidney stones    Migraine    Ovarian cyst    Preterm labor     Patient Active Problem List   Diagnosis Date Noted   SVD (spontaneous vaginal delivery) 12/09/2021   Hypertension in pregnancy, preeclampsia, severe, third trimester 12/08/2021   Gestational HTN, third trimester 12/07/2021   Gestational hypertension 08/03/2020   Allergy to amoxicillin 02/07/2020   Generalized anxiety disorder 04/07/2014   Migraine 12/21/2013    Past Surgical History:  Procedure Laterality Date   COLONOSCOPY     ESOPHAGOGASTRODUODENOSCOPY ENDOSCOPY     spinal tap     WISDOM TOOTH EXTRACTION     WISDOM TOOTH EXTRACTION  2010    OB History     Gravida  4   Para  3   Term  1   Preterm  1   AB  0   Living  3      SAB  0   IAB  0   Ectopic  0   Multiple  0   Live Births  3            Home Medications    Prior to Admission medications   Medication Sig Start Date End Date Taking? Authorizing Provider  albuterol (VENTOLIN HFA) 108 (90 Base) MCG/ACT inhaler Inhale 2 puffs into the lungs every 6 (six) hours as needed for wheezing or shortness of breath. 10/30/21  Yes Ellsworth Lennox, PA-C  cephALEXin (KEFLEX) 500 MG capsule Take 1 capsule (500 mg total) by mouth 2 (two) times daily for 7 days.  09/20/22 09/27/22 Yes Tomi Bamberger, PA-C  cetirizine (ZYRTEC) 10 MG tablet Take 10 mg by mouth daily.   Yes [provider]  Dextromethorphan HBr (DELSYM PO) Take by mouth.   Yes [provider]  mometasone (NASONEX) 50 MCG/ACT nasal spray Place 2 sprays into the nose daily.   Yes [provider]  Prenatal MV & Min w/FA-DHA (PRENATAL GUMMIES PO) Take by mouth.   Yes [provider]  acetaminophen (TYLENOL) 325 MG tablet Take 2 tablets (650 mg total) by mouth every 4 (four) hours as needed (for pain scale < 4). Patient taking differently: Take 1,000 mg by mouth every 6 (six) hours as needed for headache (for pain scale < 4). 08/05/20   Holshouser, Cala Bradford B, CNM  albuterol (PROVENTIL) (2.5 MG/3ML) 0.083% nebulizer solution Take 3 mLs (2.5 mg total) by nebulization every 6 (six) hours as needed for wheezing or shortness of breath. 02/23/21 03/25/21  Theadora Rama Scales, PA-C  amLODipine (NORVASC) 10 MG tablet Take 1 tablet (10 mg total) by mouth daily. 12/11/21 02/09/22  Hoover Browns, MD  butalbital-acetaminophen-caffeine (FIORICET) [phone] MG  tablet Take 1 tablet by mouth 2 (two) times daily as needed for headache.    [provider]  cyclobenzaprine (FLEXERIL) 10 MG tablet Take 1 tablet (10 mg total) by mouth 2 (two) times daily as needed for muscle spasms. 07/27/22   Brand Males, CNM  dextromethorphan 15 MG/5ML syrup Take 10 mLs (30 mg total) by mouth every 6 (six) hours as needed for cough. 09/17/22   Gustavus Bryant, FNP  ferrous sulfate 325 (65 FE) MG tablet Take 1 tablet (325 mg total) by mouth every other day. 08/05/20   Holshouser, [name], CNM  fluticasone (FLONASE) 50 MCG/ACT nasal spray Place 2 sprays into both nostrils daily. 02/23/21   Theadora Rama Scales, PA-C  ibuprofen (ADVIL) 600 MG tablet Take 1 tablet (600 mg total) by mouth every 6 (six) hours. 12/10/21   Hoover Browns, MD  lisinopril (ZESTRIL) 20 MG tablet Take 1 tablet (20 mg  total) by mouth daily for 60 doses. 12/11/21 02/09/22  Hoover Browns, MD  oxyCODONE-acetaminophen (PERCOCET) 5-325 MG tablet Take 1 tablet by mouth every 4 (four) hours as needed for severe pain. 08/18/22   Carlynn Herald, CNM  metoCLOPramide (REGLAN) 10 MG tablet Take 1 tablet (10 mg total) by mouth every 6 (six) hours. 03/22/17 07/31/18  [name], Coral Else, PA-C    Family History Family History  Problem Relation Age of Onset   Arthritis Mother    Deep vein thrombosis Mother    Kidney Stones Maternal Grandmother    Hypertension Maternal Grandmother    Heart disease Maternal Grandfather     Social History Social History   Tobacco Use   Smoking status: Former    Current packs/day: 0.00    Types: Cigarettes    Quit date: 2014    Years since quitting: 10.6   Smokeless tobacco: Never  Vaping Use   Vaping status: Never Used  Substance Use Topics   Alcohol use: Not Currently   Drug use: No     Allergies   Amoxicillin   Review of Systems Review of Systems  Constitutional:  Negative for chills and fever.  HENT:  Positive for ear pain. Negative for congestion.   Eyes:  Negative for discharge and redness.  Respiratory:  Negative for cough and shortness of breath.   Gastrointestinal:  Negative for abdominal pain, nausea and vomiting.     Physical Exam Triage Vital Signs ED Triage Vitals  Encounter Vitals Group     BP 09/20/22 1126 111/66     Systolic BP Percentile --      Diastolic BP Percentile --      Pulse Rate 09/20/22 1126 91     Resp 09/20/22 1126 18     Temp 09/20/22 1126 98.2 F (36.8 C)     Temp Source 09/20/22 1126 Oral     SpO2 09/20/22 1126 97 %     Weight 09/20/22 1124 171 lb (77.6 kg)     Height 09/20/22 1124 5' (1.524 m)     Head Circumference --      Peak Flow --      Pain Score 09/20/22 1122 6     Pain Loc --      Pain Education --      Exclude from Growth Chart --    No data found.  Updated Vital Signs BP 111/66 (BP Location: Right Arm)    Pulse 91   Temp 98.2 F (36.8 C) (Oral)   Resp 18   Ht 5' (  1.524 m)   Wt 171 lb (77.6 kg)   LMP 06/11/2022 (Exact Date)   SpO2 97%   BMI 33.40 kg/m      Physical Exam Vitals and nursing note reviewed.  Constitutional:      General: She is not in acute distress.    Appearance: Normal appearance. She is not ill-appearing.  HENT:     Head: Normocephalic and atraumatic.     Right Ear: Tympanic membrane and ear canal normal.     Left Ear: Ear canal normal.     Ears:     Comments: Left TM dull and erythematous    Nose: Nose normal. No congestion or rhinorrhea.     Mouth/Throat:     Mouth: Mucous membranes are moist.     Pharynx: Oropharynx is clear. No oropharyngeal exudate or posterior oropharyngeal erythema.  Eyes:     Conjunctiva/sclera: Conjunctivae normal.  Cardiovascular:     Rate and Rhythm: Normal rate.  Pulmonary:     Effort: Pulmonary effort is normal. No respiratory distress.  Neurological:     Mental Status: She is alert.  Psychiatric:        Mood and Affect: Mood normal.        Behavior: Behavior normal.        Thought Content: Thought content normal.      UC Treatments / Results  Labs (all labs ordered are listed, but only abnormal results are displayed) Labs Reviewed - No data to display  EKG   Radiology No results found.  Procedures Procedures (including critical care time)  Medications Ordered in UC Medications - No data to display  Initial Impression / Assessment and Plan / UC Course  I have reviewed the triage vital signs and the nursing notes.  Pertinent labs & imaging results that were available during my care of the patient were reviewed by me and considered in my medical decision making (see chart for details).    Will treat to cover otitis media with Keflex.  Encouraged follow-up if no gradual improvement with any further concerns.  Final Clinical Impressions(s) / UC Diagnoses   Final diagnoses:  Other acute nonsuppurative  otitis media of left ear, recurrence not specified   Discharge Instructions   None    ED Prescriptions     Medication Sig Dispense Auth. Provider   cephALEXin (KEFLEX) 500 MG capsule Take 1 capsule (500 mg total) by mouth 2 (two) times daily for 7 days. 14 capsule Tomi Bamberger, PA-C      PDMP not reviewed this encounter.   Tomi Bamberger, PA-C 09/20/22 [phone]

## 2022-09-20 NOTE — ED Notes (Signed)
Requested work note and provided

## 2022-09-20 NOTE — ED Triage Notes (Signed)
"  I came here x2 days ago & the ear pain in left ear is worsening and causing me to be off balance". No fever.

## 2022-09-24 LAB — OB RESULTS CONSOLE HEPATITIS B SURFACE ANTIGEN: Hepatitis B Surface Ag: NEGATIVE

## 2022-09-24 LAB — HEPATITIS C ANTIBODY: HCV Ab: NEGATIVE

## 2022-09-24 LAB — OB RESULTS CONSOLE HIV ANTIBODY (ROUTINE TESTING): HIV: NONREACTIVE

## 2022-09-24 LAB — OB RESULTS CONSOLE RPR: RPR: NONREACTIVE

## 2022-10-02 ENCOUNTER — Telehealth: Payer: Self-pay

## 2022-10-02 ENCOUNTER — Other Ambulatory Visit: Payer: Self-pay | Admitting: Obstetrics and Gynecology

## 2022-10-02 DIAGNOSIS — Z8759 Personal history of other complications of pregnancy, childbirth and the puerperium: Secondary | ICD-10-CM

## 2022-10-15 ENCOUNTER — Telehealth: Payer: Self-pay

## 2022-10-15 NOTE — Telephone Encounter (Signed)
Called patient to reschedule her from the morning of 9/24 due to staffing.  Could not leave a message due to her mail box being full.  Will send a secure chat to request patient call office to reschedule anatomy ultrasound from 10/23/22.

## 2022-10-22 DIAGNOSIS — Z8759 Personal history of other complications of pregnancy, childbirth and the puerperium: Secondary | ICD-10-CM | POA: Insufficient documentation

## 2022-10-22 HISTORY — DX: Personal history of other complications of pregnancy, childbirth and the puerperium: Z87.59

## 2022-10-23 ENCOUNTER — Ambulatory Visit: Payer: Managed Care, Other (non HMO)

## 2022-10-23 ENCOUNTER — Other Ambulatory Visit: Payer: Managed Care, Other (non HMO)

## 2022-10-25 ENCOUNTER — Ambulatory Visit: Payer: Managed Care, Other (non HMO)

## 2022-10-25 ENCOUNTER — Encounter: Payer: Self-pay | Admitting: *Deleted

## 2022-10-25 ENCOUNTER — Other Ambulatory Visit: Payer: Self-pay | Admitting: *Deleted

## 2022-10-25 ENCOUNTER — Ambulatory Visit: Payer: Managed Care, Other (non HMO) | Attending: Obstetrics and Gynecology

## 2022-10-25 VITALS — BP 126/69 | HR 106

## 2022-10-25 DIAGNOSIS — O43192 Other malformation of placenta, second trimester: Secondary | ICD-10-CM | POA: Diagnosis not present

## 2022-10-25 DIAGNOSIS — Z8759 Personal history of other complications of pregnancy, childbirth and the puerperium: Secondary | ICD-10-CM

## 2022-10-25 DIAGNOSIS — O99212 Obesity complicating pregnancy, second trimester: Secondary | ICD-10-CM

## 2022-10-25 DIAGNOSIS — O10913 Unspecified pre-existing hypertension complicating pregnancy, third trimester: Secondary | ICD-10-CM

## 2022-10-25 DIAGNOSIS — E669 Obesity, unspecified: Secondary | ICD-10-CM

## 2022-10-25 DIAGNOSIS — O09292 Supervision of pregnancy with other poor reproductive or obstetric history, second trimester: Secondary | ICD-10-CM | POA: Diagnosis not present

## 2022-10-25 DIAGNOSIS — Z3A19 19 weeks gestation of pregnancy: Secondary | ICD-10-CM

## 2022-11-09 ENCOUNTER — Inpatient Hospital Stay (HOSPITAL_COMMUNITY)
Admission: AD | Admit: 2022-11-09 | Discharge: 2022-11-09 | Disposition: A | Discharge status: Home / Self Care | Payer: Managed Care, Other (non HMO) | Attending: Obstetrics & Gynecology | Admitting: Obstetrics & Gynecology

## 2022-11-09 ENCOUNTER — Telehealth: Payer: Self-pay | Admitting: Advanced Practice Midwife

## 2022-11-09 ENCOUNTER — Inpatient Hospital Stay (HOSPITAL_COMMUNITY): Payer: Managed Care, Other (non HMO)

## 2022-11-09 ENCOUNTER — Encounter (HOSPITAL_COMMUNITY): Payer: Self-pay | Admitting: Obstetrics and Gynecology

## 2022-11-09 ENCOUNTER — Other Ambulatory Visit: Payer: Self-pay

## 2022-11-09 DIAGNOSIS — R109 Unspecified abdominal pain: Secondary | ICD-10-CM

## 2022-11-09 DIAGNOSIS — M549 Dorsalgia, unspecified: Secondary | ICD-10-CM | POA: Diagnosis present

## 2022-11-09 DIAGNOSIS — O26892 Other specified pregnancy related conditions, second trimester: Secondary | ICD-10-CM | POA: Insufficient documentation

## 2022-11-09 DIAGNOSIS — N200 Calculus of kidney: Secondary | ICD-10-CM | POA: Insufficient documentation

## 2022-11-09 DIAGNOSIS — R1110 Vomiting, unspecified: Secondary | ICD-10-CM | POA: Diagnosis present

## 2022-11-09 DIAGNOSIS — O26832 Pregnancy related renal disease, second trimester: Secondary | ICD-10-CM | POA: Diagnosis not present

## 2022-11-09 DIAGNOSIS — R1030 Lower abdominal pain, unspecified: Secondary | ICD-10-CM | POA: Diagnosis present

## 2022-11-09 DIAGNOSIS — Z3A21 21 weeks gestation of pregnancy: Secondary | ICD-10-CM | POA: Diagnosis not present

## 2022-11-09 LAB — URINALYSIS, ROUTINE W REFLEX MICROSCOPIC
Bilirubin Urine: NEGATIVE
Glucose, UA: NEGATIVE mg/dL
Ketones, ur: NEGATIVE mg/dL
Leukocytes,Ua: NEGATIVE
Nitrite: NEGATIVE
Protein, ur: 30 mg/dL — AB
RBC / HPF: >50 RBC/hpf (ref 0–5)
Specific Gravity, Urine: 1.018 (ref 1.005–1.030)
pH: 6 (ref 5.0–8.0)

## 2022-11-09 MED ORDER — KETOROLAC TROMETHAMINE 60 MG/2ML IM SOLN
60.0000 mg | Freq: Once | INTRAMUSCULAR | Status: AC
  Administered 2022-11-09: 60 mg via INTRAMUSCULAR
  Filled 2022-11-09: qty 2

## 2022-11-09 MED ORDER — OXYCODONE-ACETAMINOPHEN 5-325 MG PO TABS: 1.0000 | ORAL_TABLET | Freq: Four times a day (QID) | ORAL | 0 refills | Status: DC | PRN

## 2022-11-09 MED ORDER — TAMSULOSIN HCL 0.4 MG PO CAPS: 0.4000 mg | ORAL_CAPSULE | Freq: Every day | ORAL | 1 refills | Status: DC

## 2022-11-09 MED ORDER — HYDROMORPHONE HCL 1 MG/ML IJ SOLN
1.0000 mg | Freq: Once | INTRAMUSCULAR | Status: AC
  Administered 2022-11-09: 1 mg via INTRAMUSCULAR
  Filled 2022-11-09: qty 1

## 2022-11-09 NOTE — MAU Provider Note (Signed)
Chief Complaint: Back Pain and Flank Pain   Event Date/Time   First Provider Initiated Contact with Patient 11/09/22 1102      SUBJECTIVE HPI: Angelica Kim is a 33 y.o. G4P1103 at [redacted]w[redacted]d who presents to maternity admissions reporting onset of left flank and left lower back pain this morning while at work. The pain was severe, caused her to double over, and was associated with n/v. After vomiting, the pain is still severe but is slightly less. The pain is radiating down her lower abdomen into her pelvis.  She has hx of kidney stones.  There are no urinary or vaginal symptoms. She is feeling fetal movement.   Marland KitchenHPI  Past Medical History:  Diagnosis Date   Allergy to amoxicillin 02/07/2020   Ankylosing spondylitis (HCC)    Asthma    Fibromyalgia    History of pre-eclampsia    History of premature delivery 02/07/2020   Kidney stones    Migraine    Ovarian cyst    Preterm labor    Past Surgical History:  Procedure Laterality Date   COLONOSCOPY     ESOPHAGOGASTRODUODENOSCOPY ENDOSCOPY     spinal tap     WISDOM TOOTH EXTRACTION     WISDOM TOOTH EXTRACTION  2010   Social History   Socioeconomic History   Marital status: Single    Spouse name: Not on file   Number of children: 1   Years of education: Masters   Highest education level: Not on file  Occupational History    Employer: [employer]  Tobacco Use   Smoking status: Former    Current packs/day: 0.00    Types: Cigarettes    Quit date: 2014    Years since quitting: 10.7   Smokeless tobacco: Never  Vaping Use   Vaping status: Never Used  Substance and Sexual Activity   Alcohol use: Not Currently   Drug use: No   Sexual activity: Not Currently    Birth control/protection: None    Comment: pregnant  Other Topics Concern   Not on file  Social History Narrative   Patient drinks caffeine moderate    Patient is single   Patient lives alone    Patient has no children    Patient has a Master's degree    Caffeine:  soda (3 12oz per day)   Social Determinants of Health   Financial Resource Strain: Not on file  Food Insecurity: No Food Insecurity (12/08/2021)   Hunger Vital Sign    Worried About Running Out of Food in the Last Year: Never true    Ran Out of Food in the Last Year: Never true  Transportation Needs: No Transportation Needs (12/08/2021)   PRAPARE - Administrator, Civil Service (Medical): No    Lack of Transportation (Non-Medical): No  Physical Activity: Not on file  Stress: Not on file  Social Connections: Not on file  Intimate Partner Violence: Not At Risk (12/08/2021)   Humiliation, Afraid, Rape, and Kick questionnaire    Fear of Current or Ex-Partner: No    Emotionally Abused: No    Physically Abused: No    Sexually Abused: No   No current facility-administered medications on file prior to encounter.   Current Outpatient Medications on File Prior to Encounter  Medication Sig Dispense Refill   aspirin 81 MG chewable tablet Chew 81 mg by mouth daily.     butalbital-acetaminophen-caffeine (FIORICET) 50-325-40 MG tablet Take 1 tablet by mouth 2 (two) times daily as needed  for headache.     cyclobenzaprine (FLEXERIL) 10 MG tablet Take 1 tablet (10 mg total) by mouth 2 (two) times daily as needed for muscle spasms. 20 tablet 0   mometasone (NASONEX) 50 MCG/ACT nasal spray Place 2 sprays into the nose daily.     Prenatal MV & Min w/FA-DHA (PRENATAL GUMMIES PO) Take by mouth.     acetaminophen (TYLENOL) 325 MG tablet Take 2 tablets (650 mg total) by mouth every 4 (four) hours as needed (for pain scale < 4). (Patient taking differently: Take 1,000 mg by mouth every 6 (six) hours as needed for headache (for pain scale < 4).)     albuterol (PROVENTIL) (2.5 MG/3ML) 0.083% nebulizer solution Take 3 mLs (2.5 mg total) by nebulization every 6 (six) hours as needed for wheezing or shortness of breath. 360 mL 0   albuterol (VENTOLIN HFA) 108 (90 Base) MCG/ACT inhaler Inhale 2 puffs  into the lungs every 6 (six) hours as needed for wheezing or shortness of breath. 1 each 0   amLODipine (NORVASC) 10 MG tablet Take 1 tablet (10 mg total) by mouth daily. (Patient not taking: Reported on 11/09/2022) 30 tablet 1   cetirizine (ZYRTEC) 10 MG tablet Take 10 mg by mouth daily. (Patient not taking: Reported on 10/25/2022)     dextromethorphan 15 MG/5ML syrup Take 10 mLs (30 mg total) by mouth every 6 (six) hours as needed for cough. (Patient not taking: Reported on 10/25/2022) 120 mL 0   Dextromethorphan HBr (DELSYM PO) Take by mouth. (Patient not taking: Reported on 10/25/2022)     ferrous sulfate 325 (65 FE) MG tablet Take 1 tablet (325 mg total) by mouth every other day. (Patient not taking: Reported on 10/25/2022) 30 tablet 1   fluticasone (FLONASE) 50 MCG/ACT nasal spray Place 2 sprays into both nostrils daily. 18 mL 0   ibuprofen (ADVIL) 600 MG tablet Take 1 tablet (600 mg total) by mouth every 6 (six) hours. (Patient not taking: Reported on 10/25/2022) 30 tablet 0   lisinopril (ZESTRIL) 20 MG tablet Take 1 tablet (20 mg total) by mouth daily for 60 doses. 30 tablet 1   oxyCODONE-acetaminophen (PERCOCET) 5-325 MG tablet Take 1 tablet by mouth every 4 (four) hours as needed for severe pain. (Patient not taking: Reported on 10/25/2022) 5 tablet 0   [DISCONTINUED] metoCLOPramide (REGLAN) 10 MG tablet Take 1 tablet (10 mg total) by mouth every 6 (six) hours. 30 tablet 0   Allergies  Allergen Reactions   Amoxicillin Hives, Shortness Of Breath and Swelling    Swelling of hands, face, and lip Has patient had a PCN reaction causing immediate rash, facial/tongue/throat swelling, SOB or lightheadedness with hypotension: Unknown Has patient had a PCN reaction causing severe rash involving mucus membranes or skin necrosis: Yes Has patient had a PCN reaction that required hospitalization: No Has patient had a PCN reaction occurring within the last 10 years: No If all of the above answers are "NO",  then may proceed with Cephalosporin use.     ROS:  Review of Systems  Constitutional:  Negative for chills, fatigue and fever.  Eyes:  Negative for visual disturbance.  Respiratory:  Negative for shortness of breath.   Cardiovascular:  Negative for chest pain.  Gastrointestinal:  Positive for abdominal pain. Negative for nausea and vomiting.  Genitourinary:  Positive for flank pain. Negative for difficulty urinating, dysuria, pelvic pain, vaginal bleeding, vaginal discharge and vaginal pain.  Musculoskeletal:  Positive for back pain.  Neurological:  Negative  for dizziness and headaches.  Psychiatric/Behavioral: Negative.       I have reviewed patient's Past Medical Hx, Surgical Hx, Family Hx, Social Hx, medications and allergies.   Physical Exam  Patient Vitals for the past 24 hrs:  BP Temp Temp src Pulse Resp SpO2 Height Weight  11/09/22 0959 123/68 98.1 F (36.7 C) Oral 96 18 100 % -- --  11/09/22 0953 -- -- -- -- -- -- 5\' 5"  (1.651 m) 80.5 kg   Constitutional: Well-developed, well-nourished female in no acute distress.  Cardiovascular: normal rate Respiratory: normal effort GI: Abd soft, non-tender. Pos BS x 4 MS: Extremities nontender, no edema, normal ROM Neurologic: Alert and oriented x 4.  GU: Neg CVAT.  PELVIC EXAM: Cervix closed/thick/high  FHT 151 by doppler  LAB RESULTS Results for orders placed or performed during the hospital encounter of 11/09/22 (from the past 24 hour(s))  Urinalysis, Routine w reflex microscopic -Urine, Clean Catch     Status: Abnormal   Collection Time: 11/09/22 10:00 AM  Result Value Ref Range   Color, Urine YELLOW YELLOW   APPearance HAZY (A) CLEAR   Specific Gravity, Urine 1.018 1.005 - 1.030   pH 6.0 5.0 - 8.0   Glucose, UA NEGATIVE NEGATIVE mg/dL   Hgb urine dipstick MODERATE (A) NEGATIVE   Bilirubin Urine NEGATIVE NEGATIVE   Ketones, ur NEGATIVE NEGATIVE mg/dL   Protein, ur 30 (A) NEGATIVE mg/dL   Nitrite NEGATIVE NEGATIVE    Leukocytes,Ua NEGATIVE NEGATIVE   RBC / HPF >50 0 - 5 RBC/hpf   WBC, UA 0-5 0 - 5 WBC/hpf   Bacteria, UA RARE (A) NONE SEEN   Squamous Epithelial / HPF 0-5 0 - 5 /HPF   Mucus PRESENT    Hyaline Casts, UA PRESENT     --/--/B POS (11/09 0115)  IMAGING  MAU Management/MDM: Orders Placed This Encounter  Procedures   US RENAL   Urinalysis, Routine w reflex microscopic -Urine, Clean Catch   Calculi, with Photograph (to Clinical Lab)    Meds ordered this encounter  Medications   ketorolac (TORADOL) injection 60 mg   HYDROmorphone (DILAUDID) injection 1 mg   oxyCODONE-acetaminophen (PERCOCET/ROXICET) 5-325 MG tablet    Sig: Take 1-2 tablets by mouth every 6 (six) hours as needed for severe pain.    Dispense:  10 tablet    Refill:  0    Order Specific Question:   Supervising Provider    Answer:   Duane Lope H [[id number]]   tamsulosin (FLOMAX) 0.4 MG CAPS capsule    Sig: Take 1 capsule (0.4 mg total) by mouth daily.    Dispense:  14 capsule    Refill:  1    Order Specific Question:   Supervising Provider    Answer:   Duane Lope H [[id number]]    Renal US without significant hydronephrosis.  Pt passed small stone in bathroom in MAU which was sent for analysis.  Pt pain improved initially with Toradol but returned. Dilaudid 1 mg IM given.  D/C home with Rx for Percocet x 10 tabs. Pt to f/u in office next week, and follow up with urology outpatient. Return precautions reviewed.   ASSESSMENT 1. Renal calculi   2. Abdominal pain during pregnancy in second trimester   3. [redacted] weeks gestation of pregnancy     PLAN Discharge home Allergies as of 11/09/2022       Reactions   Amoxicillin Hives, Shortness Of Breath, Swelling   Swelling of hands, face, and lip  Has patient had a PCN reaction causing immediate rash, facial/tongue/throat swelling, SOB or lightheadedness with hypotension: Unknown Has patient had a PCN reaction causing severe rash involving mucus membranes or skin necrosis:  Yes Has patient had a PCN reaction that required hospitalization: No Has patient had a PCN reaction occurring within the last 10 years: No If all of the above answers are "NO", then may proceed with Cephalosporin use.        Medication List     STOP taking these medications    acetaminophen 325 MG tablet Commonly known as: Tylenol   amLODipine 10 MG tablet Commonly known as: NORVASC   DELSYM PO   dextromethorphan 15 MG/5ML syrup   ibuprofen 600 MG tablet Commonly known as: ADVIL   lisinopril 20 MG tablet Commonly known as: ZESTRIL   mometasone 50 MCG/ACT nasal spray Commonly known as: NASONEX       TAKE these medications    albuterol (2.5 MG/3ML) 0.083% nebulizer solution Commonly known as: PROVENTIL Take 3 mLs (2.5 mg total) by nebulization every 6 (six) hours as needed for wheezing or shortness of breath.   albuterol 108 (90 Base) MCG/ACT inhaler Commonly known as: VENTOLIN HFA Inhale 2 puffs into the lungs every 6 (six) hours as needed for wheezing or shortness of breath.   aspirin 81 MG chewable tablet Chew 81 mg by mouth daily.   butalbital-acetaminophen-caffeine 50-325-40 MG tablet Commonly known as: FIORICET Take 1 tablet by mouth 2 (two) times daily as needed for headache.   cetirizine 10 MG tablet Commonly known as: ZYRTEC Take 10 mg by mouth daily.   cyclobenzaprine 10 MG tablet Commonly known as: FLEXERIL Take 1 tablet (10 mg total) by mouth 2 (two) times daily as needed for muscle spasms.   ferrous sulfate 325 (65 FE) MG tablet Take 1 tablet (325 mg total) by mouth every other day.   fluticasone 50 MCG/ACT nasal spray Commonly known as: FLONASE Place 2 sprays into both nostrils daily.   oxyCODONE-acetaminophen 5-325 MG tablet Commonly known as: PERCOCET/ROXICET Take 1-2 tablets by mouth every 6 (six) hours as needed for severe pain. What changed:  how much to take when to take this   PRENATAL GUMMIES PO Take by mouth.    tamsulosin 0.4 MG Caps capsule Commonly known as: FLOMAX Take 1 capsule (0.4 mg total) by mouth daily.        Follow-up Information     [facility] Follow up.   Specialty: Obstetrics and Gynecology Why: As scheduled Contact information: [address] [place] [address] [phone]        [facility] Follow up.   Specialty: Obstetrics and Gynecology Why: As needed for emergencies Contact information: [address] [place] [address] [phone]                Sharen Counter Certified Nurse-Midwife 11/09/2022  3:50 PM

## 2022-11-09 NOTE — Telephone Encounter (Signed)
Pt called after MAU visit tonight because [facility] was unable to fill prescription.  Rx for Percocet for kidney stone pain was sent today, 11/09/22.  I called the pharmacist and reviewed, made changes to the prescription with limit of narcotic amount daily, and prescription was resent.  I called the patient and let her know the prescription was resent.  Pt to follow up as directed.  See MAU note.

## 2022-11-09 NOTE — MAU Note (Signed)
Angelica Kim is a 32 y.o. at [redacted]w[redacted]d here in MAU reporting: she began having left flank and back pain that began 1 hour ago.  Denies pain or burning with urination.  Reports hasn't taken any meds to treat pain.  Also reports has vomited a few times today since pain started.Denies VB or LOF.  Reports +FM.   LMP: 06/11/2022 Onset of complaint: today Pain score: 9 back, 8 flank Vitals:   11/09/22 0959  BP: 123/68  Pulse: 96  Resp: 18  Temp: 98.1 F (36.7 C)  SpO2: 100%     FHT:151 bpm Lab orders placed from triage:   UA

## 2022-11-09 NOTE — MAU Note (Signed)
Pt passed  a stone in her urine. Showed it to provider. Pt stated she still feel some pain on the right side.

## 2022-11-09 NOTE — MAU Note (Addendum)
Error in charting.

## 2022-11-23 LAB — CALCULI, WITH PHOTOGRAPH (CLINICAL LAB)
Hydroxyapatite: 100 %
Weight Calculi: 31 mg

## 2022-11-30 ENCOUNTER — Telehealth: Payer: Self-pay

## 2022-11-30 ENCOUNTER — Ambulatory Visit: Payer: Managed Care, Other (non HMO) | Admitting: *Deleted

## 2022-11-30 ENCOUNTER — Ambulatory Visit: Payer: Managed Care, Other (non HMO) | Attending: Obstetrics

## 2022-11-30 ENCOUNTER — Encounter: Payer: Self-pay | Admitting: *Deleted

## 2022-11-30 ENCOUNTER — Other Ambulatory Visit: Payer: Self-pay | Admitting: *Deleted

## 2022-11-30 DIAGNOSIS — O10912 Unspecified pre-existing hypertension complicating pregnancy, second trimester: Secondary | ICD-10-CM

## 2022-11-30 DIAGNOSIS — E669 Obesity, unspecified: Secondary | ICD-10-CM | POA: Diagnosis not present

## 2022-11-30 DIAGNOSIS — O10012 Pre-existing essential hypertension complicating pregnancy, second trimester: Secondary | ICD-10-CM | POA: Diagnosis not present

## 2022-11-30 DIAGNOSIS — O43192 Other malformation of placenta, second trimester: Secondary | ICD-10-CM | POA: Diagnosis not present

## 2022-11-30 DIAGNOSIS — O09292 Supervision of pregnancy with other poor reproductive or obstetric history, second trimester: Secondary | ICD-10-CM

## 2022-11-30 DIAGNOSIS — O10913 Unspecified pre-existing hypertension complicating pregnancy, third trimester: Secondary | ICD-10-CM | POA: Insufficient documentation

## 2022-11-30 DIAGNOSIS — O99212 Obesity complicating pregnancy, second trimester: Secondary | ICD-10-CM | POA: Diagnosis not present

## 2022-11-30 DIAGNOSIS — Z3A24 24 weeks gestation of pregnancy: Secondary | ICD-10-CM

## 2022-11-30 NOTE — Telephone Encounter (Signed)
Spoke with patient about rescheduling to next week due to [place] being down an ultrasound tech.   Patient will be out of town next week. Will call another patient to move

## 2022-12-11 ENCOUNTER — Ambulatory Visit
Admission: EM | Admit: 2022-12-11 | Discharge: 2022-12-11 | Disposition: A | Discharge status: Home / Self Care | Payer: Managed Care, Other (non HMO) | Attending: Internal Medicine | Admitting: Internal Medicine

## 2022-12-11 ENCOUNTER — Other Ambulatory Visit: Payer: Self-pay

## 2022-12-11 ENCOUNTER — Encounter: Payer: Self-pay | Admitting: Emergency Medicine

## 2022-12-11 DIAGNOSIS — Z20822 Contact with and (suspected) exposure to covid-19: Secondary | ICD-10-CM | POA: Diagnosis present

## 2022-12-11 DIAGNOSIS — J069 Acute upper respiratory infection, unspecified: Secondary | ICD-10-CM | POA: Insufficient documentation

## 2022-12-11 LAB — POCT INFLUENZA A/B
Influenza A, POC: NEGATIVE
Influenza B, POC: NEGATIVE

## 2022-12-11 MED ORDER — FLUTICASONE PROPIONATE 50 MCG/ACT NA SUSP: 1.0000 | Freq: Every day | NASAL | 0 refills | Status: DC

## 2022-12-11 MED ORDER — GUAIFENESIN 200 MG PO TABS: 200.0000 mg | ORAL_TABLET | ORAL | 0 refills | Status: DC | PRN

## 2022-12-11 NOTE — ED Provider Notes (Signed)
[name]    CSN: [id number] Arrival date & time: 12/11/22  1647      History   Chief Complaint Chief Complaint  Patient presents with   Cough    HPI Angelica Kim is a 32 y.o. female.   Patient presents with approximately 3-day history of nasal congestion and cough.  Reports that she has had a little bit of blood-tinged sputum with productive cough.  Denies any fever at home but does report body aches.  Had an episode of nausea and vomiting today as well.  Denies any known sick contacts.  Reports she took a COVID test at work that was negative.  She is currently about [redacted] weeks pregnant.  She has taken Tylenol for symptoms.  Reports history of asthma but has not had to use her albuterol inhaler more often since being sick.   Cough   Past Medical History:  Diagnosis Date   Allergy to amoxicillin 02/07/2020   Ankylosing spondylitis (HCC)    Asthma    Fibromyalgia    History of pre-eclampsia    History of premature delivery 02/07/2020   Kidney stones    Migraine    Ovarian cyst    Preterm labor     Patient Active Problem List   Diagnosis Date Noted   History of severe pre-eclampsia 10/22/2022   SVD (spontaneous vaginal delivery) 12/09/2021   Hypertension in pregnancy, preeclampsia, severe, third trimester 12/08/2021   Gestational HTN, third trimester 12/07/2021   Gestational hypertension 08/03/2020   Generalized anxiety disorder 04/07/2014   Migraine 12/21/2013    Past Surgical History:  Procedure Laterality Date   COLONOSCOPY     ESOPHAGOGASTRODUODENOSCOPY ENDOSCOPY     spinal tap     WISDOM TOOTH EXTRACTION     WISDOM TOOTH EXTRACTION  2010    OB History     Gravida  4   Para  3   Term  1   Preterm  1   AB  0   Living  3      SAB  0   IAB  0   Ectopic  0   Multiple  0   Live Births  3            Home Medications    Prior to Admission medications   Medication Sig Start Date End Date Taking? Authorizing Provider   fluticasone (FLONASE) 50 MCG/ACT nasal spray Place 1 spray into both nostrils daily. 12/11/22  Yes Dennis Hegeman, Rolly Salter E, FNP  guaiFENesin 200 MG tablet Take 1 tablet (200 mg total) by mouth every 4 (four) hours as needed for cough or to loosen phlegm. 12/11/22  Yes Tayli Buch, Rolly Salter E, FNP  albuterol (PROVENTIL) (2.5 MG/3ML) 0.083% nebulizer solution Take 3 mLs (2.5 mg total) by nebulization every 6 (six) hours as needed for wheezing or shortness of breath. 02/23/21 03/25/21  Theadora Rama Scales, PA-C  albuterol (VENTOLIN HFA) 108 (90 Base) MCG/ACT inhaler Inhale 2 puffs into the lungs every 6 (six) hours as needed for wheezing or shortness of breath. 10/30/21   Ellsworth Lennox, PA-C  aspirin 81 MG chewable tablet Chew 81 mg by mouth daily.    [provider]  butalbital-acetaminophen-caffeine (FIORICET) 50-325-40 MG tablet Take 1 tablet by mouth 2 (two) times daily as needed for headache.    [provider]  cetirizine (ZYRTEC) 10 MG tablet Take 10 mg by mouth daily. Patient not taking: Reported on 10/25/2022    [provider]  cyclobenzaprine (FLEXERIL) 10 MG  tablet Take 1 tablet (10 mg total) by mouth 2 (two) times daily as needed for muscle spasms. 07/27/22   Brand Males, CNM  ferrous sulfate 325 (65 FE) MG tablet Take 1 tablet (325 mg total) by mouth every other day. Patient not taking: Reported on 10/25/2022 08/05/20   Holshouser, [name], CNM  oxyCODONE-acetaminophen (PERCOCET/ROXICET) 5-325 MG tablet Take 1-2 tablets by mouth every 6 (six) hours as needed for severe pain. Do not take more than 6 tablets daily. Patient not taking: Reported on 11/30/2022 11/09/22   Leftwich-KirbyWilmer Floor, CNM  Prenatal MV & Min w/FA-DHA (PRENATAL GUMMIES PO) Take by mouth.    [provider]  tamsulosin (FLOMAX) 0.4 MG CAPS capsule Take 1 capsule (0.4 mg total) by mouth daily. Patient not taking: Reported on 11/30/2022 11/09/22   Leftwich-Kirby, Wilmer Floor, CNM  metoCLOPramide (REGLAN)  10 MG tablet Take 1 tablet (10 mg total) by mouth every 6 (six) hours. 03/22/17 07/31/18  [name], Coral Else, PA-C    Family History Family History  Problem Relation Age of Onset   Arthritis Mother    Deep vein thrombosis Mother    Kidney Stones Maternal Grandmother    Hypertension Maternal Grandmother    Heart disease Maternal Grandfather     Social History Social History   Tobacco Use   Smoking status: Former    Current packs/day: 0.00    Types: Cigarettes    Quit date: 2014    Years since quitting: 10.8   Smokeless tobacco: Never  Vaping Use   Vaping status: Never Used  Substance Use Topics   Alcohol use: Not Currently   Drug use: No     Allergies   Amoxicillin   Review of Systems Review of Systems Per HPI  Physical Exam Triage Vital Signs ED Triage Vitals  Encounter Vitals Group     BP 12/11/22 1820 109/72     Systolic BP Percentile --      Diastolic BP Percentile --      Pulse Rate 12/11/22 1820 96     Resp 12/11/22 1820 18     Temp 12/11/22 1820 98.1 F (36.7 C)     Temp Source 12/11/22 1820 Oral     SpO2 12/11/22 1820 98 %     Weight --      Height --      Head Circumference --      Peak Flow --      Pain Score 12/11/22 1821 3     Pain Loc --      Pain Education --      Exclude from Growth Chart --    No data found.  Updated Vital Signs BP 109/72 (BP Location: Left Arm)   Pulse 96   Temp 98.1 F (36.7 C) (Oral)   Resp 18   LMP 06/11/2022 (Exact Date)   SpO2 98%   Visual Acuity Right Eye Distance:   Left Eye Distance:   Bilateral Distance:    Right Eye Near:   Left Eye Near:    Bilateral Near:     Physical Exam Constitutional:      General: She is not in acute distress.    Appearance: Normal appearance. She is not toxic-appearing or diaphoretic.  HENT:     Head: Normocephalic and atraumatic.     Right Ear: Tympanic membrane and ear canal normal.     Left Ear: Tympanic membrane and ear canal normal.     Nose: Congestion present.  Mouth/Throat:     Mouth: Mucous membranes are moist.     Pharynx: No posterior oropharyngeal erythema.  Eyes:     Extraocular Movements: Extraocular movements intact.     Conjunctiva/sclera: Conjunctivae normal.     Pupils: Pupils are equal, round, and reactive to light.  Cardiovascular:     Rate and Rhythm: Normal rate and regular rhythm.     Pulses: Normal pulses.     Heart sounds: Normal heart sounds.  Pulmonary:     Effort: Pulmonary effort is normal. No respiratory distress.     Breath sounds: Normal breath sounds. No stridor. No wheezing, rhonchi or rales.  Abdominal:     General: Abdomen is flat. Bowel sounds are normal.     Palpations: Abdomen is soft.  Musculoskeletal:        General: Normal range of motion.     Cervical back: Normal range of motion.  Skin:    General: Skin is warm and dry.  Neurological:     General: No focal deficit present.     Mental Status: She is alert and oriented to person, place, and time. Mental status is at baseline.  Psychiatric:        Mood and Affect: Mood normal.        Behavior: Behavior normal.      UC Treatments / Results  Labs (all labs ordered are listed, but only abnormal results are displayed) Labs Reviewed  SARS CORONAVIRUS 2 (TAT 6-24 HRS)  POCT INFLUENZA A/B    EKG   Radiology No results found.  Procedures Procedures (including critical care time)  Medications Ordered in UC Medications - No data to display  Initial Impression / Assessment and Plan / UC Course  I have reviewed the triage vital signs and the nursing notes.  Pertinent labs & imaging results that were available during my care of the patient were reviewed by me and considered in my medical decision making (see chart for details).     Patient presents with symptoms likely from a viral upper respiratory infection. Symptoms seem unlikely related to ACS, CHF or COPD exacerbations, pneumonia, pneumothorax. Patient is nontoxic appearing and not in  need of emergent medical intervention.  Rapid flu negative.  COVID test pending.  Patient reporting blood-tinged sputum but there are no adventitious lung sounds on exam, oxygen is normal, patient is not tachypneic so do not think that emergent evaluation is necessary.  Patient is also pregnant so will defer chest imaging at this time.  Patient was advised to go to the emergency department if blood in sputum persists or worsens or if symptoms persist or worsen.  Recommended symptom control with medications that are safe with pregnancy.  Guaifenesin and Flonase prescribed for patient.  Advised adequate fluid.  Return if symptoms fail to improve in 1-2 weeks or you develop shortness of breath, chest pain, severe headache. Patient states understanding and is agreeable.  Discharged with PCP followup.  Final Clinical Impressions(s) / UC Diagnoses   Final diagnoses:  Encounter for laboratory testing for COVID-19 virus  Viral upper respiratory tract infection with cough     Discharge Instructions      Rapid flu is negative.  COVID test pending.  Suspect viral cause to symptoms.  I have prescribed you 2 medications to help with symptoms.  If symptoms persist or worsen, please follow-up for further evaluation and management.     ED Prescriptions     Medication Sig Dispense Auth. Provider   guaiFENesin 200  MG tablet Take 1 tablet (200 mg total) by mouth every 4 (four) hours as needed for cough or to loosen phlegm. 30 tablet [place], [place], [place]   fluticasone [facility]) 50 MCG/ACT nasal spray Place 1 spray into both nostrils daily. 16 g Gustavus Bryant, [place]      PDMP not reviewed this encounter.   Gustavus Bryant, [place] 12/11/22 1859

## 2022-12-11 NOTE — ED Triage Notes (Signed)
Pt here with cough and congestion x 3 days with some vomiting today

## 2022-12-11 NOTE — Discharge Instructions (Signed)
Rapid flu is negative.  COVID test pending.  Suspect viral cause to symptoms.  I have prescribed you 2 medications to help with symptoms.  If symptoms persist or worsen, please follow-up for further evaluation and management.

## 2022-12-12 LAB — SARS CORONAVIRUS 2 (TAT 6-24 HRS): SARS Coronavirus 2: NEGATIVE

## 2022-12-20 ENCOUNTER — Inpatient Hospital Stay (HOSPITAL_COMMUNITY)
Admission: AD | Admit: 2022-12-20 | Discharge: 2022-12-20 | Disposition: A | Discharge status: Home / Self Care | Payer: Managed Care, Other (non HMO) | Attending: Obstetrics & Gynecology | Admitting: Obstetrics & Gynecology

## 2022-12-20 ENCOUNTER — Encounter (HOSPITAL_COMMUNITY): Payer: Self-pay | Admitting: Obstetrics & Gynecology

## 2022-12-20 DIAGNOSIS — Z3A27 27 weeks gestation of pregnancy: Secondary | ICD-10-CM | POA: Insufficient documentation

## 2022-12-20 DIAGNOSIS — R519 Headache, unspecified: Secondary | ICD-10-CM | POA: Diagnosis not present

## 2022-12-20 DIAGNOSIS — G44019 Episodic cluster headache, not intractable: Secondary | ICD-10-CM | POA: Diagnosis not present

## 2022-12-20 DIAGNOSIS — O26892 Other specified pregnancy related conditions, second trimester: Secondary | ICD-10-CM | POA: Diagnosis present

## 2022-12-20 LAB — URINALYSIS, ROUTINE W REFLEX MICROSCOPIC
Bilirubin Urine: NEGATIVE
Glucose, UA: NEGATIVE mg/dL
Hgb urine dipstick: NEGATIVE
Ketones, ur: NEGATIVE mg/dL
Leukocytes,Ua: NEGATIVE
Nitrite: NEGATIVE
Protein, ur: NEGATIVE mg/dL
Specific Gravity, Urine: 1.011 (ref 1.005–1.030)
pH: 7 (ref 5.0–8.0)

## 2022-12-20 MED ORDER — EXCEDRIN TENSION HEADACHE 500-65 MG PO TABS: 2.0000 | ORAL_TABLET | Freq: Four times a day (QID) | ORAL | 0 refills | Status: DC | PRN

## 2022-12-20 MED ORDER — ACETAMINOPHEN-CAFFEINE 500-65 MG PO TABS
2.0000 | ORAL_TABLET | Freq: Once | ORAL | Status: AC
  Administered 2022-12-20: 2 via ORAL
  Filled 2022-12-20: qty 2

## 2022-12-20 MED ORDER — METOCLOPRAMIDE HCL 10 MG PO TABS: 10.0000 mg | ORAL_TABLET | Freq: Four times a day (QID) | ORAL | 0 refills | Status: DC

## 2022-12-20 MED ORDER — DIPHENHYDRAMINE HCL 25 MG PO CAPS
25.0000 mg | ORAL_CAPSULE | Freq: Once | ORAL | Status: AC
  Administered 2022-12-20: 25 mg via ORAL
  Filled 2022-12-20: qty 1

## 2022-12-20 MED ORDER — METOCLOPRAMIDE HCL 10 MG PO TABS
10.0000 mg | ORAL_TABLET | Freq: Once | ORAL | Status: AC
  Administered 2022-12-20: 10 mg via ORAL
  Filled 2022-12-20: qty 1

## 2022-12-20 MED ORDER — DIPHENHYDRAMINE HCL 25 MG PO TABS: 25.0000 mg | ORAL_TABLET | Freq: Four times a day (QID) | ORAL | 0 refills | Status: DC | PRN

## 2022-12-20 NOTE — MAU Provider Note (Addendum)
History     CSN: [id number]  Arrival date and time: 12/20/22 1119   None     Chief Complaint  Patient presents with   Headache   HPI Presenting for evaluation for headache.  History of headaches which she took Flexeril and Fioricet yesterday without relief.  Has not taken any medicines today.  Patient is also reporting increased pelvic pressure over the last few days.  Denies any bleeding, gush of fluid and reports good fetal movement.  OB History     Gravida  4   Para  3   Term  1   Preterm  1   AB  0   Living  3      SAB  0   IAB  0   Ectopic  0   Multiple  0   Live Births  3           Past Medical History:  Diagnosis Date   Allergy to amoxicillin 02/07/2020   Ankylosing spondylitis (HCC)    Asthma    Fibromyalgia    History of pre-eclampsia    History of premature delivery 02/07/2020   Kidney stones    Migraine    Ovarian cyst    Preterm labor     Past Surgical History:  Procedure Laterality Date   COLONOSCOPY     ESOPHAGOGASTRODUODENOSCOPY ENDOSCOPY     spinal tap     WISDOM TOOTH EXTRACTION     WISDOM TOOTH EXTRACTION  2010    Family History  Problem Relation Age of Onset   Arthritis Mother    Deep vein thrombosis Mother    Kidney Stones Maternal Grandmother    Hypertension Maternal Grandmother    Heart disease Maternal Grandfather     Social History   Tobacco Use   Smoking status: Former    Current packs/day: 0.00    Types: Cigarettes    Quit date: 2014    Years since quitting: 10.8   Smokeless tobacco: Never  Vaping Use   Vaping status: Never Used  Substance Use Topics   Alcohol use: Not Currently   Drug use: No    Allergies:  Allergies  Allergen Reactions   Amoxicillin Hives, Shortness Of Breath and Swelling    Swelling of hands, face, and lip Has patient had a PCN reaction causing immediate rash, facial/tongue/throat swelling, SOB or lightheadedness with hypotension: Unknown Has patient had a PCN reaction  causing severe rash involving mucus membranes or skin necrosis: Yes Has patient had a PCN reaction that required hospitalization: No Has patient had a PCN reaction occurring within the last 10 years: No If all of the above answers are "NO", then may proceed with Cephalosporin use.     Medications Prior to Admission  Medication Sig Dispense Refill Last Dose   albuterol (VENTOLIN HFA) 108 (90 Base) MCG/ACT inhaler Inhale 2 puffs into the lungs every 6 (six) hours as needed for wheezing or shortness of breath. 1 each 0 12/20/2022   aspirin 81 MG chewable tablet Chew 81 mg by mouth daily.   12/20/2022   butalbital-acetaminophen-caffeine (FIORICET) 50-325-40 MG tablet Take 1 tablet by mouth 2 (two) times daily as needed for headache.   12/20/2022 at 0000   cyclobenzaprine (FLEXERIL) 10 MG tablet Take 1 tablet (10 mg total) by mouth 2 (two) times daily as needed for muscle spasms. 20 tablet 0 12/20/2022 at 0000   fluticasone (FLONASE) 50 MCG/ACT nasal spray Place 1 spray into both nostrils daily. 16 g 0  12/20/2022   oxyCODONE-acetaminophen (PERCOCET/ROXICET) 5-325 MG tablet Take 1-2 tablets by mouth every 6 (six) hours as needed for severe pain. Do not take more than 6 tablets daily. 10 tablet 0 Past Week   Prenatal MV & Min w/FA-DHA (PRENATAL GUMMIES PO) Take by mouth.   12/20/2022   albuterol (PROVENTIL) (2.5 MG/3ML) 0.083% nebulizer solution Take 3 mLs (2.5 mg total) by nebulization every 6 (six) hours as needed for wheezing or shortness of breath. 360 mL 0    cetirizine (ZYRTEC) 10 MG tablet Take 10 mg by mouth daily. (Patient not taking: Reported on 10/25/2022)      ferrous sulfate 325 (65 FE) MG tablet Take 1 tablet (325 mg total) by mouth every other day. (Patient not taking: Reported on 10/25/2022) 30 tablet 1    guaiFENesin 200 MG tablet Take 1 tablet (200 mg total) by mouth every 4 (four) hours as needed for cough or to loosen phlegm. 30 tablet 0    tamsulosin (FLOMAX) 0.4 MG CAPS capsule Take  1 capsule (0.4 mg total) by mouth daily. (Patient not taking: Reported on 11/30/2022) 14 capsule 1     Review of Systems  Constitutional:  Negative for chills and fever.  HENT:  Negative for congestion.   Eyes:  Positive for photophobia. Negative for visual disturbance.  Respiratory:  Negative for shortness of breath.   Cardiovascular:  Negative for chest pain.  Gastrointestinal:  Negative for abdominal pain.  Genitourinary:  Positive for pelvic pain. Negative for vaginal bleeding and vaginal discharge.  Neurological:  Positive for headaches.  All other systems reviewed and are negative.  Physical Exam   Blood pressure 110/68, pulse 92, temperature 98.1 F (36.7 C), temperature source Oral, resp. rate 14, weight 80.7 kg, last menstrual period 06/11/2022, SpO2 98%, not currently breastfeeding.  Physical Exam Vitals reviewed.  Constitutional:      Appearance: She is well-developed.     Comments: In room with lights down  HENT:     Head: Normocephalic and atraumatic.  Eyes:     Extraocular Movements: Extraocular movements intact.  Cardiovascular:     Rate and Rhythm: Normal rate.  Pulmonary:     Effort: Pulmonary effort is normal.  Abdominal:     Palpations: Abdomen is soft.  Musculoskeletal:        General: Normal range of motion.     Cervical back: Normal range of motion.  Skin:    General: Skin is warm.     Capillary Refill: Capillary refill takes less than 2 seconds.  Neurological:     Mental Status: She is alert.     Cranial Nerves: No cranial nerve deficit.     Sensory: No sensory deficit.  Psychiatric:        Mood and Affect: Mood normal.        Behavior: Behavior normal.    MAU Course  Procedures  MDM Physical exam Headache cocktail   Assessment and Plan  Angelica Kim is a 32 year old G4 P1-1-0-3 at 27 weeks 3 days presenting for headache for the last 4 days.   Headache Patient reports headache for the last 4 days which she has taken Fioricet and Tylenol  for without relief.  Reports history of headaches which she follows with neurology for.  No longer sees neurology.  Reports photosensitivity.  Reports adequate hydration.  Has not taken any medication since last night.  Reports headache is 10/10.  Patient's blood pressures are within normal limits, denies any right upper quadrant pain worsening  swelling in lower extremities, seeing spots.  Patient given Excedrin tension, Reglan, Benadryl.  On reevaluation patient reports the headache is now 6/10.  Discussed continuing this regimen as needed and patient should follow-up with neurology and patient is agreeable.  Discussed strict return precautions.  Patient discharged home.  Celedonio Savage 12/20/2022, 12:31 PM

## 2022-12-20 NOTE — MAU Note (Signed)
..  Angelica Kim is a 32 y.o. at [redacted]w[redacted]d here in MAU reporting: was at her OB and told them she has had a headache for x4 days. In the office her BP was 89/60. States she has taken Fioricet, tylenol, oxycodone, flexeril and caffeine with no relief. Patient has a history of migraines since 2014 and states after all of those medications it usually resolves.  Also reporting lower pelvic pressure for x2 days. No VB or LOF. +FM.   Pain score: headache- 10     pelvic pressure- 6 Vitals:   12/20/22 1141  BP: 119/68  Pulse: 99  Resp: 14  Temp: 98.1 F (36.7 C)  SpO2: 99%     FHT:145 Lab orders placed from triage:   UA

## 2022-12-21 DIAGNOSIS — O99019 Anemia complicating pregnancy, unspecified trimester: Secondary | ICD-10-CM | POA: Insufficient documentation

## 2022-12-24 ENCOUNTER — Ambulatory Visit: Payer: Managed Care, Other (non HMO) | Attending: Maternal & Fetal Medicine

## 2022-12-24 ENCOUNTER — Other Ambulatory Visit: Payer: Self-pay

## 2022-12-24 DIAGNOSIS — O99212 Obesity complicating pregnancy, second trimester: Secondary | ICD-10-CM | POA: Insufficient documentation

## 2022-12-24 DIAGNOSIS — Z3A28 28 weeks gestation of pregnancy: Secondary | ICD-10-CM

## 2022-12-24 DIAGNOSIS — O43193 Other malformation of placenta, third trimester: Secondary | ICD-10-CM

## 2022-12-24 DIAGNOSIS — O10912 Unspecified pre-existing hypertension complicating pregnancy, second trimester: Secondary | ICD-10-CM | POA: Insufficient documentation

## 2022-12-24 DIAGNOSIS — E669 Obesity, unspecified: Secondary | ICD-10-CM

## 2022-12-24 DIAGNOSIS — O10013 Pre-existing essential hypertension complicating pregnancy, third trimester: Secondary | ICD-10-CM

## 2022-12-24 DIAGNOSIS — O43192 Other malformation of placenta, second trimester: Secondary | ICD-10-CM | POA: Insufficient documentation

## 2022-12-24 DIAGNOSIS — O09293 Supervision of pregnancy with other poor reproductive or obstetric history, third trimester: Secondary | ICD-10-CM

## 2022-12-24 DIAGNOSIS — O99213 Obesity complicating pregnancy, third trimester: Secondary | ICD-10-CM

## 2022-12-27 ENCOUNTER — Observation Stay
Admission: EM | Admit: 2022-12-27 | Discharge: 2022-12-28 | Disposition: A | Discharge status: Home / Self Care | Payer: Managed Care, Other (non HMO) | Attending: Obstetrics and Gynecology | Admitting: Obstetrics and Gynecology

## 2022-12-27 ENCOUNTER — Observation Stay: Payer: Managed Care, Other (non HMO)

## 2022-12-27 ENCOUNTER — Encounter: Payer: Self-pay | Admitting: Obstetrics and Gynecology

## 2022-12-27 ENCOUNTER — Other Ambulatory Visit: Payer: Self-pay

## 2022-12-27 DIAGNOSIS — O133 Gestational [pregnancy-induced] hypertension without significant proteinuria, third trimester: Secondary | ICD-10-CM | POA: Insufficient documentation

## 2022-12-27 DIAGNOSIS — O43192 Other malformation of placenta, second trimester: Secondary | ICD-10-CM

## 2022-12-27 DIAGNOSIS — O99013 Anemia complicating pregnancy, third trimester: Secondary | ICD-10-CM | POA: Diagnosis not present

## 2022-12-27 DIAGNOSIS — O26893 Other specified pregnancy related conditions, third trimester: Secondary | ICD-10-CM | POA: Diagnosis present

## 2022-12-27 DIAGNOSIS — O43113 Circumvallate placenta, third trimester: Secondary | ICD-10-CM | POA: Diagnosis not present

## 2022-12-27 DIAGNOSIS — Z7982 Long term (current) use of aspirin: Secondary | ICD-10-CM | POA: Insufficient documentation

## 2022-12-27 DIAGNOSIS — R109 Unspecified abdominal pain: Principal | ICD-10-CM | POA: Diagnosis present

## 2022-12-27 HISTORY — DX: Other malformation of placenta, second trimester: O43.192

## 2022-12-27 LAB — CBC
HCT: 30.4 % — ABNORMAL LOW (ref 36.0–46.0)
Hemoglobin: 10.5 g/dL — ABNORMAL LOW (ref 12.0–15.0)
MCH: 29.9 pg (ref 26.0–34.0)
MCHC: 34.5 g/dL (ref 30.0–36.0)
MCV: 86.6 fL (ref 80.0–100.0)
Platelets: 189 10*3/uL (ref 150–400)
RBC: 3.51 MIL/uL — ABNORMAL LOW (ref 3.87–5.11)
RDW: 13.7 % (ref 11.5–15.5)
WBC: 12.8 10*3/uL — ABNORMAL HIGH (ref 4.0–10.5)
nRBC: 0 % (ref 0.0–0.2)

## 2022-12-27 LAB — URINALYSIS, COMPLETE (UACMP) WITH MICROSCOPIC
Bilirubin Urine: NEGATIVE
Glucose, UA: NEGATIVE mg/dL
Ketones, ur: NEGATIVE mg/dL
Leukocytes,Ua: NEGATIVE
Nitrite: NEGATIVE
Protein, ur: NEGATIVE mg/dL
Specific Gravity, Urine: 1.014 (ref 1.005–1.030)
pH: 6 (ref 5.0–8.0)

## 2022-12-27 LAB — COMPREHENSIVE METABOLIC PANEL
ALT: 10 U/L (ref 0–44)
AST: 17 U/L (ref 15–41)
Albumin: 3 g/dL — ABNORMAL LOW (ref 3.5–5.0)
Alkaline Phosphatase: 90 U/L (ref 38–126)
Anion gap: 9 (ref 5–15)
BUN: 5 mg/dL — ABNORMAL LOW (ref 6–20)
CO2: 20 mmol/L — ABNORMAL LOW (ref 22–32)
Calcium: 8.1 mg/dL — ABNORMAL LOW (ref 8.9–10.3)
Chloride: 107 mmol/L (ref 98–111)
Creatinine, Ser: 0.59 mg/dL (ref 0.44–1.00)
GFR, Estimated: >60 mL/min (ref >60)
Glucose, Bld: 89 mg/dL (ref 70–99)
Potassium: 3.2 mmol/L — ABNORMAL LOW (ref 3.5–5.1)
Sodium: 136 mmol/L (ref 135–145)
Total Bilirubin: 0.5 mg/dL (ref <1.2)
Total Protein: 6.3 g/dL — ABNORMAL LOW (ref 6.5–8.1)

## 2022-12-27 MED ORDER — ACETAMINOPHEN 325 MG PO TABS
650.0000 mg | ORAL_TABLET | ORAL | Status: DC | PRN
  Administered 2022-12-28: 650 mg via ORAL
  Filled 2022-12-27: qty 2

## 2022-12-27 MED ORDER — ONDANSETRON HCL 4 MG/2ML IJ SOLN: 4.0000 mg | Freq: Once | INTRAMUSCULAR | Status: AC

## 2022-12-27 MED ORDER — MORPHINE SULFATE (PF) 2 MG/ML IV SOLN
2.0000 mg | INTRAVENOUS | Status: DC | PRN
  Administered 2022-12-27 – 2022-12-28 (×4): 2 mg via INTRAVENOUS
  Filled 2022-12-27 (×5): qty 1

## 2022-12-27 MED ORDER — SODIUM CHLORIDE 0.9 % IV SOLN
25.0000 mg | Freq: Four times a day (QID) | INTRAVENOUS | Status: DC | PRN
  Administered 2022-12-27 – 2022-12-28 (×2): 25 mg via INTRAVENOUS
  Filled 2022-12-27: qty 25
  Filled 2022-12-27: qty 1

## 2022-12-27 MED ORDER — LACTATED RINGERS IV BOLUS
1000.0000 mL | Freq: Once | INTRAVENOUS | Status: AC
  Administered 2022-12-27: 1000 mL via INTRAVENOUS

## 2022-12-27 MED ORDER — MORPHINE SULFATE (PF) 2 MG/ML IV SOLN: 1.0000 mg | INTRAVENOUS | Status: DC | PRN

## 2022-12-27 MED ORDER — ONDANSETRON HCL 4 MG/2ML IJ SOLN
INTRAMUSCULAR | Status: AC
  Administered 2022-12-27: 4 mg via INTRAVENOUS
  Filled 2022-12-27: qty 2

## 2022-12-27 MED ORDER — LACTATED RINGERS IV SOLN: INTRAVENOUS | Status: DC

## 2022-12-27 NOTE — OB Triage Note (Signed)
Pt arrives with c/o right flank pain x 3 days. Pt sees [address] [place] in [place]. Pt has history of kidney stones.

## 2022-12-27 NOTE — Discharge Summary (Signed)
Patient ID: Angelica Kim MRN: [id number] DOB/AGE: 09/02/1990 32 y.o.  Admit date: 12/27/2022 Discharge date: 12/28/2022  Admission Diagnoses: 32yo G5P3 at [redacted]w[redacted]d presents with right flank pain for the last 3 days and passed a stone a couple ol days ago. She relates this pain to previous episodes of kidney stones.  She denies contractions or VB. Due to the severe pain she is reporting severe nausea and vomiting.   Discharge Diagnoses: Kidney Stones  Factors complicating pregnancy: Patient Active Problem List   Diagnosis Date Noted   Flank pain, acute 12/27/2022   Marginal insertion of umbilical cord affecting management of mother in second trimester 12/27/2022   Anemia of pregnancy 12/21/2022   History of severe pre-eclampsia 10/22/2022   SVD (spontaneous vaginal delivery) 12/09/2021   Hypertension in pregnancy, preeclampsia, severe, third trimester 12/08/2021   Gestational HTN, third trimester 12/07/2021   Chronic headache disorder 06/12/2021   Fibromyalgia 11/15/2020   Gestational hypertension 08/03/2020   Generalized anxiety disorder 04/07/2014   Migraine 12/21/2013     Prenatal Procedures: ultrasound  Consults: None  Significant Diagnostic Studies:  Results for orders placed or performed during the hospital encounter of 12/27/22 (from the past 168 hour(s))  CBC   Collection Time: 12/27/22  8:56 PM  Result Value Ref Range   WBC 12.8 (H) 4.0 - 10.5 K/uL   RBC 3.51 (L) 3.87 - 5.11 MIL/uL   Hemoglobin 10.5 (L) 12.0 - 15.0 g/dL   HCT 82.9 (L) 56.2 - 13.0 %   MCV 86.6 80.0 - 100.0 fL   MCH 29.9 26.0 - 34.0 pg   MCHC 34.5 30.0 - 36.0 g/dL   RDW 86.5 78.4 - 69.6 %   Platelets 189 150 - 400 K/uL   nRBC 0.0 0.0 - 0.2 %  Comprehensive metabolic panel   Collection Time: 12/27/22  8:56 PM  Result Value Ref Range   Sodium 136 135 - 145 mmol/L   Potassium 3.2 (L) 3.5 - 5.1 mmol/L   Chloride 107 98 - 111 mmol/L   CO2 20 (L) 22 - 32 mmol/L   Glucose, Bld 89 70 - 99 mg/dL   BUN 5  (L) 6 - 20 mg/dL   Creatinine, Ser 2.95 0.44 - 1.00 mg/dL   Calcium 8.1 (L) 8.9 - 10.3 mg/dL   Total Protein 6.3 (L) 6.5 - 8.1 g/dL   Albumin 3.0 (L) 3.5 - 5.0 g/dL   AST 17 15 - 41 U/L   ALT 10 0 - 44 U/L   Alkaline Phosphatase 90 38 - 126 U/L   Total Bilirubin 0.5 <1.2 mg/dL   GFR, Estimated >28 >41 mL/min   Anion gap 9 5 - 15  Urinalysis, Complete w Microscopic -Urine, Clean Catch   Collection Time: 12/27/22  8:56 PM  Result Value Ref Range   Color, Urine YELLOW (A) YELLOW   APPearance HAZY (A) CLEAR   Specific Gravity, Urine 1.014 1.005 - 1.030   pH 6.0 5.0 - 8.0   Glucose, UA NEGATIVE NEGATIVE mg/dL   Hgb urine dipstick SMALL (A) NEGATIVE   Bilirubin Urine NEGATIVE NEGATIVE   Ketones, ur NEGATIVE NEGATIVE mg/dL   Protein, ur NEGATIVE NEGATIVE mg/dL   Nitrite NEGATIVE NEGATIVE   Leukocytes,Ua NEGATIVE NEGATIVE   RBC / HPF 21-50 0 - 5 RBC/hpf   WBC, UA 6-10 0 - 5 WBC/hpf   Bacteria, UA MANY (A) NONE SEEN   Squamous Epithelial / HPF 6-10 0 - 5 /HPF   Mucus PRESENT    Amorphous Crystal  PRESENT    US RENAL  Result Date: 12/27/2022 CLINICAL DATA:  [id number] Flank pain, acute [id number] EXAM: RENAL / URINARY TRACT ULTRASOUND COMPLETE COMPARISON:  Ultrasound renal 11/09/2022 FINDINGS: Right Kidney: Renal measurements: 14.8 x 6.8 x 6.2 cm = volume: 326 mL. Echogenicity within normal limits. No mass. Mild hydronephrosis visualized. Left Kidney: Renal measurements: 13.2 x 4.4 x 4.9 cm = volume: 148 mL. Echogenicity within normal limits. No mass or hydronephrosis visualized. Urinary bladder: Appears normal for degree of bladder distention. Left ureteral jet identified. No right ureteral jet. Other: None. IMPRESSION: Mild right hydronephrosis. Electronically Signed   By: Tish Frederickson M.D.   On: 12/27/2022 22:24     Treatments: IV hydration, Antiemetics, and analgesia: Morphine  Hospital Course:  This is a 32 y.o. [id number] with IUP at [redacted]w[redacted]d presents with right flank pain.  Labs collected  with significant results - UA pos for hgb and bacteria; CMP pos for K 3.2, BUN 5; and CBC pos for WBC 12.8, HGB 10.5. Renal u/s showed mild right hydronephrosis. Discussed plan of care with Dr. Feliberto Gottron - IV hydrate, treat pain with morphine and the N/V with Phenergan and Zofran and observe overnight.  She was observed, fetal heart rate monitoring remained reassuring, and she had no signs/symptoms of progressing preterm labor or other maternal-fetal concerns.  Her cervical exam was unchanged from admission.  She was deemed stable for discharge to home with outpatient follow up.  Discharge Physical Exam:  BP 99/62 (BP Location: Left Arm)   Pulse 80   Temp 97.9 F (36.6 C) (Oral)   Resp 18   Ht 5' (1.524 m)   Wt 81.6 kg   LMP 06/11/2022 (Exact Date)   SpO2 100%   Breastfeeding Unknown   BMI 35.15 kg/m   General: NAD CV: RRR Pulm: CTABL, nl effort ABD: s/nd/nt, gravid DVT Evaluation: LE non-ttp, no evidence of DVT on exam.  FHT: Appropriate for GA  TOCO: quiet SVE: deferred      Discharge Condition: Stable  Disposition:  Discharge disposition: 01-Home or Self Care        Allergies as of 12/28/2022       Reactions   Amoxicillin Hives, Shortness Of Breath, Swelling   Swelling of hands, face, and lip Has patient had a PCN reaction causing immediate rash, facial/tongue/throat swelling, SOB or lightheadedness with hypotension: Unknown Has patient had a PCN reaction causing severe rash involving mucus membranes or skin necrosis: Yes Has patient had a PCN reaction that required hospitalization: No Has patient had a PCN reaction occurring within the last 10 years: No If all of the above answers are "NO", then may proceed with Cephalosporin use.        Medication List     STOP taking these medications    cyclobenzaprine 10 MG tablet Commonly known as: FLEXERIL   guaiFENesin 200 MG tablet   metoCLOPramide 10 MG tablet Commonly known as: REGLAN        TAKE these medications    albuterol (2.5 MG/3ML) 0.083% nebulizer solution Commonly known as: PROVENTIL Take 3 mLs (2.5 mg total) by nebulization every 6 (six) hours as needed for wheezing or shortness of breath.   albuterol 108 (90 Base) MCG/ACT inhaler Commonly known as: VENTOLIN HFA Inhale 2 puffs into the lungs every 6 (six) hours as needed for wheezing or shortness of breath.   aspirin 81 MG chewable tablet Chew 81 mg by mouth daily.   butalbital-acetaminophen-caffeine 50-325-40 MG tablet Commonly known as: FIORICET Take  1 tablet by mouth 2 (two) times daily as needed for headache.   cetirizine 10 MG tablet Commonly known as: ZYRTEC Take 10 mg by mouth daily.   diphenhydrAMINE 25 MG tablet Commonly known as: BENADRYL Take 1 tablet (25 mg total) by mouth every 6 (six) hours as needed.   Excedrin Tension Headache 500-65 MG Tabs per tablet Generic drug: acetaminophen-caffeine Take 2 tablets by mouth every 6 (six) hours as needed (Headache).   ferrous sulfate 325 (65 FE) MG tablet Take 1 tablet (325 mg total) by mouth every other day.   fluticasone 50 MCG/ACT nasal spray Commonly known as: FLONASE Place 1 spray into both nostrils daily.   ondansetron 4 MG disintegrating tablet Commonly known as: ZOFRAN-ODT Take 1 tablet (4 mg total) by mouth every 8 (eight) hours as needed for nausea or vomiting.   oxyCODONE-acetaminophen 5-325 MG tablet Commonly known as: PERCOCET/ROXICET Take 1-2 tablets by mouth every 6 (six) hours as needed for severe pain (pain score 7-10). Do not take more than 6 tablets daily.   PRENATAL GUMMIES PO Take by mouth.   tamsulosin 0.4 MG Caps capsule Commonly known as: FLOMAX Take 1 capsule (0.4 mg total) by mouth daily.        Follow-up Information     [facility], [facility] Follow up.   Specialty: Obstetrics and Gynecology Why: Keep all scheduled appointments, Call with any questions or concerns Contact information: [address] [place] [address] [phone]                 Signed:  Quillian Quince 12/28/2022 12:11 PM

## 2022-12-27 NOTE — Progress Notes (Signed)
Pt to US.

## 2022-12-27 NOTE — ED Triage Notes (Signed)
Pt reports R flank pain x 3 days. States known kidney stone on L and thinks now she may have one on the R. N/v onset 45 min pta. Ambulatory to triage. Alert and oriented. Breathing unlabored. Pt reports she is [redacted] wks pregnant.

## 2022-12-28 DIAGNOSIS — O26893 Other specified pregnancy related conditions, third trimester: Secondary | ICD-10-CM | POA: Diagnosis not present

## 2022-12-28 MED ORDER — ONDANSETRON 4 MG PO TBDP: 4.0000 mg | ORAL_TABLET | Freq: Three times a day (TID) | ORAL | 0 refills | Status: DC | PRN

## 2022-12-28 MED ORDER — OXYCODONE-ACETAMINOPHEN 5-325 MG PO TABS: 1.0000 | ORAL_TABLET | Freq: Four times a day (QID) | ORAL | 0 refills | Status: DC | PRN

## 2022-12-28 MED ORDER — TAMSULOSIN HCL 0.4 MG PO CAPS: 0.4000 mg | ORAL_CAPSULE | Freq: Every day | ORAL | 0 refills | Status: DC

## 2022-12-28 NOTE — OB Triage Note (Signed)
[id number] presents to L&D t4riage at [redacted]w[redacted]d for right flank pain, pt is seen at [facility] in [place]. Pt received an IVFB, IV phenergen, and has had doses of morphine and reports feeling better. Pt is no longer vomiting but still reports pain 5/6 out of 10. Pt being discharged by [facility] and medications were sent to pt's pharmacy. Pt received discharge instructions and verbalized understanding. Pt knows to go to [facility] if her symptoms worsen or for vaginal bleeding, loss of fetal movement, and lof. Pt being discharged with mother who will be driving her home. Vitals are wnl.

## 2022-12-29 ENCOUNTER — Inpatient Hospital Stay (HOSPITAL_COMMUNITY)
Admission: AD | Admit: 2022-12-29 | Discharge: 2022-12-29 | Disposition: A | Discharge status: Home / Self Care | Payer: Managed Care, Other (non HMO) | Attending: Obstetrics and Gynecology | Admitting: Obstetrics and Gynecology

## 2022-12-29 ENCOUNTER — Encounter (HOSPITAL_COMMUNITY): Payer: Self-pay | Admitting: Obstetrics and Gynecology

## 2022-12-29 ENCOUNTER — Inpatient Hospital Stay (HOSPITAL_COMMUNITY): Payer: Managed Care, Other (non HMO)

## 2022-12-29 DIAGNOSIS — O26893 Other specified pregnancy related conditions, third trimester: Secondary | ICD-10-CM | POA: Insufficient documentation

## 2022-12-29 DIAGNOSIS — O99519 Diseases of the respiratory system complicating pregnancy, unspecified trimester: Secondary | ICD-10-CM | POA: Insufficient documentation

## 2022-12-29 DIAGNOSIS — Z3A28 28 weeks gestation of pregnancy: Secondary | ICD-10-CM | POA: Insufficient documentation

## 2022-12-29 DIAGNOSIS — N23 Unspecified renal colic: Secondary | ICD-10-CM | POA: Diagnosis not present

## 2022-12-29 DIAGNOSIS — N132 Hydronephrosis with renal and ureteral calculous obstruction: Secondary | ICD-10-CM | POA: Diagnosis not present

## 2022-12-29 DIAGNOSIS — Z87442 Personal history of urinary calculi: Secondary | ICD-10-CM | POA: Insufficient documentation

## 2022-12-29 LAB — URINALYSIS, ROUTINE W REFLEX MICROSCOPIC
Bilirubin Urine: NEGATIVE
Glucose, UA: NEGATIVE mg/dL
Hgb urine dipstick: NEGATIVE
Ketones, ur: NEGATIVE mg/dL
Leukocytes,Ua: NEGATIVE
Nitrite: NEGATIVE
Protein, ur: NEGATIVE mg/dL
Specific Gravity, Urine: 1.014 (ref 1.005–1.030)
pH: 7 (ref 5.0–8.0)

## 2022-12-29 LAB — CBC WITH DIFFERENTIAL/PLATELET
Abs Immature Granulocytes: 0.04 10*3/uL (ref 0.00–0.07)
Basophils Absolute: 0.1 10*3/uL (ref 0.0–0.1)
Basophils Relative: 1 %
Eosinophils Absolute: 0.2 10*3/uL (ref 0.0–0.5)
Eosinophils Relative: 1 %
HCT: 27.9 % — ABNORMAL LOW (ref 36.0–46.0)
Hemoglobin: 9.4 g/dL — ABNORMAL LOW (ref 12.0–15.0)
Immature Granulocytes: 0 %
Lymphocytes Relative: 22 %
Lymphs Abs: 2.4 10*3/uL (ref 0.7–4.0)
MCH: 29 pg (ref 26.0–34.0)
MCHC: 33.7 g/dL (ref 30.0–36.0)
MCV: 86.1 fL (ref 80.0–100.0)
Monocytes Absolute: 0.8 10*3/uL (ref 0.1–1.0)
Monocytes Relative: 8 %
Neutro Abs: 7.4 10*3/uL (ref 1.7–7.7)
Neutrophils Relative %: 68 %
Platelets: 179 10*3/uL (ref 150–400)
RBC: 3.24 MIL/uL — ABNORMAL LOW (ref 3.87–5.11)
RDW: 13.6 % (ref 11.5–15.5)
WBC: 10.9 10*3/uL — ABNORMAL HIGH (ref 4.0–10.5)
nRBC: 0 % (ref 0.0–0.2)

## 2022-12-29 LAB — COMPREHENSIVE METABOLIC PANEL
ALT: 12 U/L (ref 0–44)
AST: 15 U/L (ref 15–41)
Albumin: 2.4 g/dL — ABNORMAL LOW (ref 3.5–5.0)
Alkaline Phosphatase: 80 U/L (ref 38–126)
Anion gap: 6 (ref 5–15)
BUN: <5 mg/dL — ABNORMAL LOW (ref 6–20)
CO2: 22 mmol/L (ref 22–32)
Calcium: 8.1 mg/dL — ABNORMAL LOW (ref 8.9–10.3)
Chloride: 108 mmol/L (ref 98–111)
Creatinine, Ser: 0.46 mg/dL (ref 0.44–1.00)
GFR, Estimated: >60 mL/min (ref >60)
Glucose, Bld: 90 mg/dL (ref 70–99)
Potassium: 3.4 mmol/L — ABNORMAL LOW (ref 3.5–5.1)
Sodium: 136 mmol/L (ref 135–145)
Total Bilirubin: 0.3 mg/dL (ref <1.2)
Total Protein: 5.1 g/dL — ABNORMAL LOW (ref 6.5–8.1)

## 2022-12-29 MED ORDER — PROMETHAZINE HCL 25 MG/ML IJ SOLN
25.0000 mg | Freq: Once | INTRAMUSCULAR | Status: AC
  Administered 2022-12-29: 25 mg via INTRAVENOUS
  Filled 2022-12-29: qty 1

## 2022-12-29 MED ORDER — ACETAMINOPHEN 500 MG PO TABS
1000.0000 mg | ORAL_TABLET | Freq: Once | ORAL | Status: AC
Start: 2022-12-29 — End: 2022-12-29
  Administered 2022-12-29: 1000 mg via ORAL
  Filled 2022-12-29: qty 2

## 2022-12-29 MED ORDER — LACTATED RINGERS IV BOLUS
500.0000 mL | Freq: Once | INTRAVENOUS | Status: AC
Start: 2022-12-29 — End: 2022-12-29
  Administered 2022-12-29: 500 mL via INTRAVENOUS

## 2022-12-29 MED ORDER — PROMETHAZINE HCL 25 MG PO TABS: 25.0000 mg | ORAL_TABLET | Freq: Four times a day (QID) | ORAL | 2 refills | Status: DC | PRN

## 2022-12-29 NOTE — MAU Provider Note (Signed)
History of Present Illness The patient, a pregnant individual with a history of kidney stones x 10 years, presents with worsening right-sided pain, nausea, vomiting, and swelling in the hands and feet. The patient reports that the pain started on Monday and has been progressively worsening. They have been managing the pain with Tylenol and Flomax, but the pain has not subsided.  Was seen in ED and had an ultrasound that showed showed mild right hydronephrosis and no ureteral jet seen on right side.  Otherwise normal.  The patient also reports that they have been unable to eat much due to fear of vomiting, which was severe on Thursday night. They have been able to drink fluids, but report feeling like the fluids are "collecting" and not being excreted properly. The patient also reports decreased urine output and pain during urination. They express concern about a possible kidney stone obstruction or infection, as they usually pass stones within two to three days and this has not occurred. The patient also reports increased swelling in their hands and feet, which is unusual for them during kidney stone episodes.  Has seen a nephrologist for kidney stones in the past but has not had any problem with them in between pregnancies so discontinued care with them.  Past Medical History:  Diagnosis Date   Allergy to amoxicillin 02/07/2020   Ankylosing spondylitis (HCC)    Asthma    Fibromyalgia    History of pre-eclampsia    History of premature delivery 02/07/2020   Kidney stones    Migraine    Ovarian cyst    Preterm labor    OB History  Gravida Para Term Preterm AB Living  5 3 1 1  0 3  SAB IAB Ectopic Multiple Live Births  0 0 0 0 3    # Outcome Date GA Lbr Len/2nd Weight Sex Type Anes PTL Lv  5 Current           4 Term 12/09/21 [redacted]w[redacted]d 03:45 / 00:21 2750 g M Vag-Spont EPI  LIV     Name: [place],BOY Jillyn     Apgar1: 9  Apgar5: 9  3 Para 08/04/20  02:37 / 00:21 2926 g F Vag-Vacuum EPI  LIV      Birth Comments: WDL     Name: Salvi,GIRL Delorice     Apgar1: 9  Apgar5: 9  2 Preterm 07/31/19 [redacted]w[redacted]d 09:24 / 01:01 2634 g F Vag-Spont EPI  LIV     Birth Comments: WNL     Name: Cerro,GIRL Debbi     Apgar1: 8  Apgar5: 9  1 Gravida             Past Surgical History:  Procedure Laterality Date   COLONOSCOPY     ESOPHAGOGASTRODUODENOSCOPY ENDOSCOPY     spinal tap     WISDOM TOOTH EXTRACTION     WISDOM TOOTH EXTRACTION  2010   Social History   Socioeconomic History   Marital status: Single    Spouse name: Not on file   Number of children: 1   Years of education: Masters   Highest education level: Not on file  Occupational History    Employer: [employer]  Tobacco Use   Smoking status: Former    Current packs/day: 0.00    Types: Cigarettes    Quit date: 2014    Years since quitting: 10.9   Smokeless tobacco: Never  Vaping Use   Vaping status: Never Used  Substance and Sexual Activity   Alcohol use: Not  Currently   Drug use: No   Sexual activity: Yes    Birth control/protection: None    Comment: pregnant  Other Topics Concern   Not on file  Social History Narrative   Patient drinks caffeine moderate    \   Patient has a Master's degree    Caffeine: soda (3 12oz per day)      Karolee Stamps- Domestic Partner -- has 3 bonus kids with him (2 who live at home) and then 3 biological kids that live at home with her   Social Determinants of Corporate investment banker Strain: Not on file  Food Insecurity: No Food Insecurity (12/08/2021)   Hunger Vital Sign    Worried About Running Out of Food in the Last Year: Never true    Ran Out of Food in the Last Year: Never true  Transportation Needs: No Transportation Needs (12/08/2021)   PRAPARE - Administrator, Civil Service (Medical): No    Lack of Transportation (Non-Medical): No  Physical Activity: Not on file  Stress: Not on file  Social Connections: Not on file     Physical Exam General:  Mild-moderate distress.  Nontoxic-appearing Heart: Regular rate Lungs: Regular rhythm and effort ABDOMEN: Mild right CVA tenderness EXTREMITIES: Edema in hands and feet. Pelvic: Deferred  Fetal heart rate: 145, moderate variability, and 10 X10 accelerations, no decelerations Toco: None  Results Results for orders placed or performed during the hospital encounter of 12/29/22 (from the past 24 hour(s))  Urinalysis, Routine w reflex microscopic -Urine, Clean Catch     Status: Abnormal   Collection Time: 12/29/22  1:15 PM  Result Value Ref Range   Color, Urine YELLOW YELLOW   APPearance HAZY (A) CLEAR   Specific Gravity, Urine 1.014 1.005 - 1.030   pH 7.0 5.0 - 8.0   Glucose, UA NEGATIVE NEGATIVE mg/dL   Hgb urine dipstick NEGATIVE NEGATIVE   Bilirubin Urine NEGATIVE NEGATIVE   Ketones, ur NEGATIVE NEGATIVE mg/dL   Protein, ur NEGATIVE NEGATIVE mg/dL   Nitrite NEGATIVE NEGATIVE   Leukocytes,Ua NEGATIVE NEGATIVE  CBC with Differential/Platelet     Status: Abnormal   Collection Time: 12/29/22  2:08 PM  Result Value Ref Range   WBC 10.9 (H) 4.0 - 10.5 K/uL   RBC 3.24 (L) 3.87 - 5.11 MIL/uL   Hemoglobin 9.4 (L) 12.0 - 15.0 g/dL   HCT 57.8 (L) 46.9 - 62.9 %   MCV 86.1 80.0 - 100.0 fL   MCH 29.0 26.0 - 34.0 pg   MCHC 33.7 30.0 - 36.0 g/dL   RDW 52.8 41.3 - 24.4 %   Platelets 179 150 - 400 K/uL   nRBC 0.0 0.0 - 0.2 %   Neutrophils Relative % 68 %   Neutro Abs 7.4 1.7 - 7.7 K/uL   Lymphocytes Relative 22 %   Lymphs Abs 2.4 0.7 - 4.0 K/uL   Monocytes Relative 8 %   Monocytes Absolute 0.8 0.1 - 1.0 K/uL   Eosinophils Relative 1 %   Eosinophils Absolute 0.2 0.0 - 0.5 K/uL   Basophils Relative 1 %   Basophils Absolute 0.1 0.0 - 0.1 K/uL   Immature Granulocytes 0 %   Abs Immature Granulocytes 0.04 0.00 - 0.07 K/uL  Comprehensive metabolic panel     Status: Abnormal   Collection Time: 12/29/22  2:08 PM  Result Value Ref Range   Sodium 136 135 - 145 mmol/L   Potassium 3.4 (L) 3.5  - 5.1 mmol/L  Chloride 108 98 - 111 mmol/L   CO2 22 22 - 32 mmol/L   Glucose, Bld 90 70 - 99 mg/dL   BUN <5 (L) 6 - 20 mg/dL   Creatinine, Ser 1.61 0.44 - 1.00 mg/dL   Calcium 8.1 (L) 8.9 - 10.3 mg/dL   Total Protein 5.1 (L) 6.5 - 8.1 g/dL   Albumin 2.4 (L) 3.5 - 5.0 g/dL   AST 15 15 - 41 U/L   ALT 12 0 - 44 U/L   Alkaline Phosphatase 80 38 - 126 U/L   Total Bilirubin 0.3 <1.2 mg/dL   GFR, Estimated >09 >60 mL/min   Anion gap 6 5 - 15     Result Date: 12/29/2022 CLINICAL DATA:  Right flank pain EXAM: RENAL / URINARY TRACT ULTRASOUND COMPLETE COMPARISON:  Renal ultrasound dated 12/27/2022 FINDINGS: Right Kidney: Length = 12.6 cm Normal parenchymal echogenicity with preserved corticomedullary differentiation. No urinary tract dilation or shadowing calculi. The ureter is not seen. Left Kidney: Length = 13.0 cm Normal parenchymal echogenicity with preserved corticomedullary differentiation. No urinary tract dilation or shadowing calculi. The ureter is not seen. Bladder: Appears normal for degree of bladder distention. Other: None. IMPRESSION: No urinary tract dilation or shadowing calculi. Previously noted right hydronephrosis has resolved. Electronically Signed   By: Agustin Cree M.D.   On: 12/29/2022 14:56   Assessment & Plan Worsening right flank pain. No evidence of hydronephrosis on renal ultrasound. Normal creatinine level.  Normal WBC. Suspect kidney Stones although not visualized on ultrasound.  Low suspicion for pyelonephritis due to normal but WBCs, normal UA. -Continue hydration and pain management with Percocet as needed. -Continue Flomax to facilitate stone passage. -Use Phenergan for nausea and potential ureter relaxation. -Consider consultation with a nephrologist for preventive strategies. -Consider use of a warm compress for pain relief. -Check urine culture and complete blood count to rule out infection.  Pregnancy Patient is pregnant and experiencing increased swelling. No  evidence of kidney injury or infection.  Normal blood pressure.  -Monitor fluid balance and electrolyte levels. -Continue to avoid diuretics due to potential impact on amniotic fluid levels.   [employer], [place] 12/29/2022 9:32 PM

## 2022-12-29 NOTE — Discharge Instructions (Signed)
VISIT SUMMARY:  You came in today due to worsening right-sided pain, nausea, vomiting, and swelling in your hands and feet. You have a history of kidney stones and are currently pregnant. We discussed your symptoms and reviewed your current medications and treatment plan.  YOUR PLAN:  -KIDNEY STONES: Kidney stones are hard deposits made of minerals and salts that form inside your kidneys. You have recurrent kidney stones with worsening pain on your right side. We will continue with hydration and pain management using Percocet as needed, and you should continue taking Flomax to help pass the stone. For nausea, you can use Phenergan. We may consider consulting a kidney specialist to discuss preventive strategies.  -PREGNANCY: You are pregnant and experiencing increased swelling. There is no evidence of kidney injury or infection or preeclampsia. We will monitor your fluid balance and electrolyte levels, and you should continue to avoid diuretics as they can affect amniotic fluid levels.   -GENERAL HEALTH MAINTENANCE: We will check a urine culture and complete blood count to rule out any infection, and a metabolic panel to assess your kidney function and electrolyte balance. You may also use a warm compress to help relieve pain.  INSTRUCTIONS:  Please follow up with [place] if your symptoms worsen or if you have any concerns. We may consider a consultation with a nephrologist for preventive strategies regarding your kidney stones.

## 2022-12-29 NOTE — MAU Note (Addendum)
Angelica Kim is a 32 y.o. at [redacted]w[redacted]d here in MAU reporting: went to [facility] ER on 11/28 due to N/V/pain. Given fluids and pain medication and sent home after 18 hour observation and sent home with flomax, oxy, and zofran. Late last night/early this am she noticed flank on right side while urinating that had returned. Reports a constant pain while urinating and comes and goes when she is not using the bathroom. Pt instructed to come back if pain worsens. Reports having kidney stones for 10 years and knows when something is wrong because she usually passes the stone in 2-3 days. Denies any N/V today. Denies any CTXs, VB or LOF. Reports +FM  LMP: n/a Onset of complaint: ongoing since Thursday  Pain score: 5 Vitals:   12/29/22 1312  BP: 135/86  Pulse: (!) 102  Resp: 17  Temp: 98.1 F (36.7 C)     FHT:145  Lab orders placed from triage:  UA

## 2022-12-30 LAB — CULTURE, OB URINE: Culture: <10000 — AB

## 2023-01-25 ENCOUNTER — Ambulatory Visit: Payer: Managed Care, Other (non HMO)

## 2023-01-25 ENCOUNTER — Other Ambulatory Visit: Payer: Self-pay | Admitting: *Deleted

## 2023-01-25 ENCOUNTER — Ambulatory Visit (HOSPITAL_BASED_OUTPATIENT_CLINIC_OR_DEPARTMENT_OTHER): Payer: Managed Care, Other (non HMO) | Admitting: Maternal & Fetal Medicine

## 2023-01-25 ENCOUNTER — Ambulatory Visit: Payer: Managed Care, Other (non HMO) | Attending: Maternal & Fetal Medicine

## 2023-01-25 ENCOUNTER — Ambulatory Visit: Payer: Managed Care, Other (non HMO) | Admitting: *Deleted

## 2023-01-25 VITALS — BP 121/77

## 2023-01-25 DIAGNOSIS — O09293 Supervision of pregnancy with other poor reproductive or obstetric history, third trimester: Secondary | ICD-10-CM

## 2023-01-25 DIAGNOSIS — O26643 Intrahepatic cholestasis of pregnancy, third trimester: Secondary | ICD-10-CM

## 2023-01-25 DIAGNOSIS — K831 Obstruction of bile duct: Secondary | ICD-10-CM | POA: Diagnosis not present

## 2023-01-25 DIAGNOSIS — O26613 Liver and biliary tract disorders in pregnancy, third trimester: Secondary | ICD-10-CM | POA: Diagnosis not present

## 2023-01-25 DIAGNOSIS — Z8759 Personal history of other complications of pregnancy, childbirth and the puerperium: Secondary | ICD-10-CM

## 2023-01-25 DIAGNOSIS — O10913 Unspecified pre-existing hypertension complicating pregnancy, third trimester: Secondary | ICD-10-CM

## 2023-01-25 DIAGNOSIS — O10912 Unspecified pre-existing hypertension complicating pregnancy, second trimester: Secondary | ICD-10-CM | POA: Diagnosis present

## 2023-01-25 DIAGNOSIS — O99212 Obesity complicating pregnancy, second trimester: Secondary | ICD-10-CM | POA: Diagnosis present

## 2023-01-25 DIAGNOSIS — O43193 Other malformation of placenta, third trimester: Secondary | ICD-10-CM

## 2023-01-25 DIAGNOSIS — E669 Obesity, unspecified: Secondary | ICD-10-CM

## 2023-01-25 DIAGNOSIS — O10013 Pre-existing essential hypertension complicating pregnancy, third trimester: Secondary | ICD-10-CM | POA: Diagnosis not present

## 2023-01-25 DIAGNOSIS — Z3A32 32 weeks gestation of pregnancy: Secondary | ICD-10-CM

## 2023-01-25 DIAGNOSIS — O43192 Other malformation of placenta, second trimester: Secondary | ICD-10-CM

## 2023-01-25 DIAGNOSIS — O99213 Obesity complicating pregnancy, third trimester: Secondary | ICD-10-CM

## 2023-01-25 NOTE — Progress Notes (Signed)
Patient information  Patient Name: Angelica Kim [facility]  Patient MRN:   [id number]  Referring practice: MFM Referring Provider: [facility] ([facility])  MFM CONSULT  Angelica Kim is a 32 y.o. [phone] at [redacted]w[redacted]d here for ultrasound and consultation. Patient Active Problem List   Diagnosis Date Noted   Cholestasis of pregnancy in third trimester 01/25/2023   Flank pain, acute 12/27/2022   Marginal insertion of umbilical cord affecting management of mother in second trimester 12/27/2022   Anemia of pregnancy 12/21/2022   History of preeclampsia 10/22/2022   Chronic headache disorder 06/12/2021   Fibromyalgia 11/15/2020   Generalized anxiety disorder 04/07/2014   Migraine 12/21/2013   RE cholestasis of pregnancy: The patient reports new onset itching of the palms and soles that is worse at night.  She denies rash or any history of medical conditions with itching that are known to her.  I discussed my concern for cholestasis of pregnancy and its pregnancy implications.  We discussed the need for assessing the bile acid levels as well as antenatal testing.  I discussed the risk of stillbirth in relation to bile acid levels and the need for possible early delivery.  Patient reports good fetal movement we will continue to monitor throughout her pregnancy.  RE history of preeclampsia: Patient has a history of preeclampsia in a previous pregnancy.  Today her blood pressure is 121/77.  She occasionally has "floaters" in her vision but denies headache or scotomata. She has not required BP meds since her previous delivery.   Sonographic findings Single intrauterine pregnancy at 32w 4d.  Fetal cardiac activity:  Observed and appears normal. Presentation: Cephalic. Interval fetal anatomy appears normal. Fetal biometry shows the estimated fetal weight at the 59 percentile. Amniotic fluid volume: Within normal limits. MVP: 6.98 cm. Placenta: Anterior. Normal fetal movement and tone.  NST was  reactive.  Recommendations -Bile acids and CMP ordered today to confirm cholestasis of pregnancy.  Even if her bile acids are normal this does not negate the diagnosis since it is a clinical diagnosis.  Bile acid should be repeated every 3 to 4 weeks until delivery. -Weekly antenatal testing should be arranged -Growth ultrasounds every 4 weeks until delivery -Delivery timing will be based on blood pressure as well as bile acid levels and cholestasis symptoms. -OB provider can prescribe antihistamine or Actigall if patient desires at her next OB visit.  Review of Systems: A review of systems was performed and was negative except per HPI   Vitals and Physical Exam    01/25/2023   11:32 AM 12/29/2022    5:29 PM 12/29/2022    1:44 PM  Vitals with BMI  Systolic 121 132 130  Diastolic 77 87 75  Pulse  79 87    Sitting comfortably on the sonogram table Nonlabored breathing Normal rate and rhythm Abdomen is nontender  Past pregnancies OB History  Gravida Para Term Preterm AB Living  5 3 1 1  0 3  SAB IAB Ectopic Multiple Live Births  0 0 0 0 3    # Outcome Date GA Lbr Len/2nd Weight Sex Type Anes PTL Lv  5 Current           4 Term 12/09/21 [redacted]w[redacted]d 03:45 / 00:21 2.75 kg M Vag-Spont EPI  LIV  3 Para 08/04/20  02:37 / 00:21 2.926 kg F Vag-Vacuum EPI  LIV     Birth Comments: WDL  2 Preterm 07/31/19 [redacted]w[redacted]d 09:24 / 01:01 2.634 kg F Vag-Spont EPI  LIV     Birth Comments: WNL  1 Gravida              I spent 45 minutes reviewing the patients chart, including labs and images as well as counseling the patient about her medical conditions. Greater than 50% of the time was spent in direct face-to-face patient counseling.  Braxton Feathers  MFM, [place]   01/25/2023  1:33 PM

## 2023-01-25 NOTE — Procedures (Signed)
Angelica Kim 1990-07-31 [redacted]w[redacted]d  Fetus A Non-Stress Test Interpretation for 01/25/23  NST only  Indication:  ? cholestasis  Fetal Heart Rate A Mode: External Baseline Rate (A): 150 bpm Variability: Moderate Accelerations: 15 x 15 Decelerations: None Multiple birth?: No  Uterine Activity Mode: Palpation, Toco Contraction Frequency (min): none Resting Tone Palpated: Relaxed  Interpretation (Fetal Testing) Nonstress Test Interpretation: Reactive Overall Impression: Reassuring for gestational age Comments: Dr. Darra Lis reviewed tracing

## 2023-01-25 NOTE — Progress Notes (Deleted)
error 

## 2023-01-28 LAB — COMPREHENSIVE METABOLIC PANEL
ALT: 9 [IU]/L (ref 0–32)
AST: 12 [IU]/L (ref 0–40)
Albumin: 3.4 g/dL — ABNORMAL LOW (ref 3.9–4.9)
Alkaline Phosphatase: 138 [IU]/L — ABNORMAL HIGH (ref 44–121)
BUN/Creatinine Ratio: 5 — ABNORMAL LOW (ref 9–23)
BUN: 3 mg/dL — ABNORMAL LOW (ref 6–20)
Bilirubin Total: 0.3 mg/dL (ref 0.0–1.2)
CO2: 19 mmol/L — ABNORMAL LOW (ref 20–29)
Calcium: 8.4 mg/dL — ABNORMAL LOW (ref 8.7–10.2)
Chloride: 108 mmol/L — ABNORMAL HIGH (ref 96–106)
Creatinine, Ser: 0.56 mg/dL — ABNORMAL LOW (ref 0.57–1.00)
Globulin, Total: 2.3 g/dL (ref 1.5–4.5)
Glucose: 88 mg/dL (ref 70–99)
Potassium: 3.6 mmol/L (ref 3.5–5.2)
Sodium: 140 mmol/L (ref 134–144)
Total Protein: 5.7 g/dL — ABNORMAL LOW (ref 6.0–8.5)
eGFR: 124 mL/min/{1.73_m2} (ref >59)

## 2023-01-28 LAB — BILE ACIDS, TOTAL: Bile Acids Total: 1.9 umol/L (ref 0.0–10.0)

## 2023-01-29 ENCOUNTER — Encounter: Payer: Self-pay | Admitting: Maternal & Fetal Medicine

## 2023-01-30 NOTE — L&D Delivery Note (Signed)
Operative Delivery Note At 1:20 PM Angelica Kim viable female was delivered via Vaginal, Spontaneous.  Presentation:  vtx ; Position: Left,, Occiput,, Anterior; Station: +2.  Delivery of the head: 02/26/2023  1:19 PM.  Kiwi vacuum used.   Three pop offs.  ALL pulls in the green zone.  Pressure reduced inbetween pulls.  then pt pushed to delivery.  Nuchal cord times one reduced.    First maneuver: 02/26/2023  1:19 PM, McRoberts Second maneuver: 02/26/2023  1:19 PM, Suprapubic Pressure Third maneuver: 02/26/2023  1:20 PM,  Delivery of posterior arm.   Fourth maneuver: ,   Fifth maneuver: ,   Sixth maneuver: ,            Operative Delivery Note At 1:20 PM Angelica Kim viable female was delivered via Vaginal, Spontaneous.  Presentation: vertex; Position: Left,, Occiput,, Anterior; Station: +2.  Verbal consent: obtained from family.  Risks and benefits discussed in detail.  Risks include, but are not limited to the risks of anesthesia, bleeding, infection, damage to maternal tissues, fetal cephalhematoma.  There is also the risk of inability to effect vaginal delivery of the head, or shoulder dystocia that cannot be resolved by established maneuvers, leading to the need for emergency cesarean section.  APGAR: 4, 8; weight 7 lb 9.3 oz (3440 g).   Placenta status: , to pathology.  Pt with blood tinged fluid, cholestasis and GHTN Cord:  with the following complications: .  Cord pH: sent  Anesthesia:   Instruments: kiwi Episiotomy: None Lacerations: 1st degree;Perineal Suture Repair: 2.0 vicryl Est. Blood Loss (mL): 211  Mom to postpartum.  Baby to Couplet care / Skin to Skin.  Angelica Kim Angelica Kim Angelica Kim 02/26/2023, 2:50 PM

## 2023-01-31 ENCOUNTER — Ambulatory Visit: Payer: Managed Care, Other (non HMO)

## 2023-02-06 ENCOUNTER — Inpatient Hospital Stay (HOSPITAL_COMMUNITY)
Admission: AD | Admit: 2023-02-06 | Discharge: 2023-02-06 | Disposition: A | Discharge status: Home / Self Care | Payer: Managed Care, Other (non HMO) | Attending: Obstetrics and Gynecology | Admitting: Obstetrics and Gynecology

## 2023-02-06 ENCOUNTER — Encounter (HOSPITAL_COMMUNITY): Payer: Self-pay | Admitting: Obstetrics and Gynecology

## 2023-02-06 DIAGNOSIS — R519 Headache, unspecified: Secondary | ICD-10-CM | POA: Diagnosis not present

## 2023-02-06 DIAGNOSIS — Z3A34 34 weeks gestation of pregnancy: Secondary | ICD-10-CM | POA: Diagnosis not present

## 2023-02-06 DIAGNOSIS — O26893 Other specified pregnancy related conditions, third trimester: Secondary | ICD-10-CM | POA: Diagnosis present

## 2023-02-06 DIAGNOSIS — O133 Gestational [pregnancy-induced] hypertension without significant proteinuria, third trimester: Secondary | ICD-10-CM | POA: Diagnosis not present

## 2023-02-06 LAB — CBC
HCT: 29.4 % — ABNORMAL LOW (ref 36.0–46.0)
Hemoglobin: 9.9 g/dL — ABNORMAL LOW (ref 12.0–15.0)
MCH: 28.4 pg (ref 26.0–34.0)
MCHC: 33.7 g/dL (ref 30.0–36.0)
MCV: 84.5 fL (ref 80.0–100.0)
Platelets: 180 10*3/uL (ref 150–400)
RBC: 3.48 MIL/uL — ABNORMAL LOW (ref 3.87–5.11)
RDW: 13.6 % (ref 11.5–15.5)
WBC: 12.3 10*3/uL — ABNORMAL HIGH (ref 4.0–10.5)
nRBC: 0 % (ref 0.0–0.2)

## 2023-02-06 LAB — COMPREHENSIVE METABOLIC PANEL
ALT: 11 U/L (ref 0–44)
AST: 19 U/L (ref 15–41)
Albumin: 2.6 g/dL — ABNORMAL LOW (ref 3.5–5.0)
Alkaline Phosphatase: 117 U/L (ref 38–126)
Anion gap: 10 (ref 5–15)
BUN: 6 mg/dL (ref 6–20)
CO2: 17 mmol/L — ABNORMAL LOW (ref 22–32)
Calcium: 8.5 mg/dL — ABNORMAL LOW (ref 8.9–10.3)
Chloride: 110 mmol/L (ref 98–111)
Creatinine, Ser: 0.56 mg/dL (ref 0.44–1.00)
GFR, Estimated: >60 mL/min (ref >60)
Glucose, Bld: 91 mg/dL (ref 70–99)
Potassium: 3.2 mmol/L — ABNORMAL LOW (ref 3.5–5.1)
Sodium: 137 mmol/L (ref 135–145)
Total Bilirubin: 0.5 mg/dL (ref 0.0–1.2)
Total Protein: 5.6 g/dL — ABNORMAL LOW (ref 6.5–8.1)

## 2023-02-06 LAB — PROTEIN / CREATININE RATIO, URINE
Creatinine, Urine: 116 mg/dL
Protein Creatinine Ratio: 0.18 mg/mg{creat} — ABNORMAL HIGH (ref 0.00–0.15)
Total Protein, Urine: 21 mg/dL

## 2023-02-06 MED ORDER — NIFEDIPINE 10 MG PO CAPS: 20.0000 mg | ORAL_CAPSULE | ORAL | Status: DC | PRN

## 2023-02-06 MED ORDER — DIPHENHYDRAMINE HCL 50 MG/ML IJ SOLN
25.0000 mg | Freq: Once | INTRAMUSCULAR | Status: AC
  Administered 2023-02-06: 25 mg via INTRAVENOUS
  Filled 2023-02-06: qty 1

## 2023-02-06 MED ORDER — LABETALOL HCL 5 MG/ML IV SOLN: 40.0000 mg | INTRAVENOUS | Status: DC | PRN

## 2023-02-06 MED ORDER — ACETAMINOPHEN-CAFFEINE 500-65 MG PO TABS
2.0000 | ORAL_TABLET | Freq: Once | ORAL | Status: AC
Start: 2023-02-06 — End: 2023-02-06
  Administered 2023-02-06: 2 via ORAL
  Filled 2023-02-06: qty 2

## 2023-02-06 MED ORDER — NIFEDIPINE 10 MG PO CAPS: 10.0000 mg | ORAL_CAPSULE | ORAL | Status: DC | PRN

## 2023-02-06 MED ORDER — METOCLOPRAMIDE HCL 5 MG/ML IJ SOLN
10.0000 mg | Freq: Once | INTRAMUSCULAR | Status: AC
  Administered 2023-02-06: 10 mg via INTRAVENOUS
  Filled 2023-02-06: qty 2

## 2023-02-06 NOTE — MAU Provider Note (Signed)
 Chief Complaint:  Headache and Hypertension   HPI   Event Date/Time   First Provider Initiated Contact with Patient 02/06/23 0942      Angelica Kim is a 33 y.o. [id number] at [redacted]w[redacted]d who presents to maternity admissions reporting concern for Severe BP's last night. 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 She took Excedrin , Benadryl , and Reglan  by mouth last night and felt slightly better but than woke up with a HA again rating it 7/10 and did not take any additional medication today.  Denies any SOB, RUQ pain, or visual changes , N/V today.  Pregnancy Course: [facility],   Past Medical History:  Diagnosis Date   Allergy to amoxicillin 02/07/2020   Ankylosing spondylitis (HCC)    Asthma    Diverticulitis 2014   Fibromyalgia    Gestational HTN, third trimester 12/07/2021   Gestational hypertension 08/03/2020   History of pre-eclampsia    History of premature delivery 02/07/2020   History of severe pre-eclampsia 10/22/2022   Hypertension in pregnancy, preeclampsia, severe, third trimester 12/08/2021   Kidney stones    Migraine    Ovarian cyst    Preterm labor    SVD (spontaneous vaginal delivery) 12/09/2021   OB History  Gravida Para Term Preterm AB Living  4 3 1 1  0 3  SAB IAB Ectopic Multiple Live Births  0 0 0 0 3    # Outcome Date GA Lbr Len/2nd Weight Sex Type Anes PTL Lv  4 Current           3 Term 12/09/21 [redacted]w[redacted]d 03:45 / 00:21 2750 g M Vag-Spont EPI  LIV  2 Para 08/04/20  02:37 / 00:21 2926 g F Vag-Vacuum EPI  LIV     Birth Comments: WDL  1 Preterm 07/31/19 [redacted]w[redacted]d 09:24 / 01:01 2634 g F Vag-Spont EPI  LIV     Birth Comments: WNL   Past Surgical History:  Procedure Laterality Date   COLONOSCOPY     ESOPHAGOGASTRODUODENOSCOPY ENDOSCOPY     spinal tap     WISDOM TOOTH EXTRACTION     WISDOM TOOTH EXTRACTION  2010   Family History   Problem Relation Age of Onset   Arthritis Mother    Deep vein thrombosis Mother    Kidney Stones Maternal Grandmother    Hypertension Maternal Grandmother    Heart disease Maternal Grandfather    Social History   Tobacco Use   Smoking status: Former    Current packs/day: 0.00    Types: Cigarettes    Quit date: 2014    Years since quitting: 11.0   Smokeless tobacco: Never  Vaping Use   Vaping status: Never Used  Substance Use Topics   Alcohol use: Not Currently   Drug use: No   Allergies  Allergen Reactions   Amoxicillin Hives, Shortness Of Breath and Swelling    Swelling of hands, face, and lip Has patient had a PCN reaction causing immediate rash, facial/tongue/throat swelling, SOB or lightheadedness with hypotension: Unknown Has patient had a PCN reaction causing severe rash involving mucus membranes or skin necrosis: Yes Has patient had a PCN reaction that required hospitalization: No Has patient had a PCN reaction occurring within the last 10 years: No If all of the above answers are NO, then may proceed with Cephalosporin use.    Medications Prior to Admission  Medication Sig Dispense Refill Last Dose/Taking   acetaminophen -caffeine  (EXCEDRIN  TENSION HEADACHE) 500-65 MG TABS per tablet Take 2 tablets by mouth every 6 (six) hours as needed (Headache). 60 tablet 0 02/05/2023   albuterol  (VENTOLIN  HFA) 108 (90 Base) MCG/ACT inhaler Inhale 2 puffs into the lungs every 6 (six) hours as needed for wheezing or shortness of breath. 1 each 0 02/05/2023   aspirin 81 MG chewable tablet Chew 81 mg by mouth daily.   02/05/2023   butalbital -acetaminophen -caffeine  (FIORICET ) 50-325-40 MG tablet Take 1 tablet by mouth 2 (two) times daily as needed for headache.   Past Month   diphenhydrAMINE  (BENADRYL ) 25 MG tablet Take 1 tablet (25 mg total) by mouth every 6 (six) hours as needed. 30 tablet 0 02/05/2023   ferrous sulfate  325 (65 FE) MG tablet Take 1 tablet (325 mg total) by mouth every  other day. 30 tablet 1 02/05/2023   fluticasone  (FLONASE ) 50 MCG/ACT nasal spray Place 1 spray into both nostrils daily. 16 g 0 02/05/2023   ondansetron  (ZOFRAN -ODT) 4 MG disintegrating tablet Take 1 tablet (4 mg total) by mouth every 8 (eight) hours as needed for nausea or vomiting. 20 tablet 0 02/06/2023 Morning   Prenatal MV & Min w/FA-DHA (PRENATAL GUMMIES PO) Take by mouth.   02/05/2023   promethazine  (PHENERGAN ) 25 MG tablet Take 1 tablet (25 mg total) by mouth every 6 (six) hours as needed for nausea or vomiting. 30 tablet 2 Past Week   albuterol  (PROVENTIL ) (2.5 MG/3ML) 0.083% nebulizer solution Take 3 mLs (2.5 mg total) by nebulization every 6 (six) hours as needed for wheezing or shortness of breath. 360 mL 0    cetirizine  (ZYRTEC ) 10 MG tablet Take 10 mg by mouth daily. (Patient not taking: Reported on 10/25/2022)   More than a month   oxyCODONE -acetaminophen  (PERCOCET/ROXICET) 5-325 MG tablet Take 1-2 tablets by mouth every 6 (six) hours as needed for severe pain (pain score 7-10). Do not take more than 6 tablets daily. 10 tablet 0    tamsulosin  (FLOMAX ) 0.4 MG CAPS capsule Take 1 capsule (0.4 mg total) by mouth daily. 14 capsule 0 More than a month    I have reviewed patient's Past Medical Hx, Surgical Hx, Family Hx, Social Hx, medications and allergies.   ROS  Pertinent items noted in HPI and remainder of comprehensive ROS otherwise negative.   PHYSICAL EXAM  Patient Vitals for the past 24 hrs:  BP Temp Temp src Pulse Resp SpO2 Height Weight  02/06/23 1215 -- -- -- -- -- 100 % -- --  02/06/23 1137 -- -- -- -- -- 99 % -- --  02/06/23 1131 132/78 -- -- 76 -- -- -- --  02/06/23 1116 132/85 -- -- 70 -- -- -- --  02/06/23 1101 (!) 134/93 -- -- 82 -- -- -- --  02/06/23 1045 (!) 133/90 -- -- 78 -- 100 % -- --  02/06/23 1030 133/89 -- -- 91 -- 100 % -- --  02/06/23 0946 (!) 136/91 -- -- 90 -- -- -- --  02/06/23 0930 (!) 138/93 -- -- 99 -- 100 % -- --  02/06/23 0925 (!) 140/97 98.1 F (36.7  C) Oral 97 16 100 % 5' (1.524 m) 85.7 kg    Constitutional: Well-developed, well-nourished female in no acute distress.  Cardiovascular: normal rate & rhythm, warm and well-perfused Respiratory: normal effort, no problems with respiration noted GI: Abd soft, non-tender, non-distended MS: Extremities nontender, no edema, normal ROM Neurologic: Alert and oriented x 4.  GU: no CVA tenderness Pelvic: NEFG, physiologic discharge, no blood, cervix clean.      Fetal Tracing: Baseline: Variability: Accelerations:  Decelerations: Toco:    Labs: Results for orders placed or performed during the hospital encounter of 02/06/23 (from the past 24 hours)  Protein / creatinine ratio, urine     Status: Abnormal   Collection Time: 02/06/23  9:43 AM  Result Value Ref Range   Creatinine, Urine 116 mg/dL   Total Protein, Urine 21 mg/dL   Protein Creatinine Ratio 0.18 (H) 0.00 - 0.15 mg/mg[Cre]  CBC     Status: Abnormal   Collection Time: 02/06/23 10:15 AM  Result Value Ref Range   WBC 12.3 (H) 4.0 - 10.5 K/uL   RBC 3.48 (L) 3.87 - 5.11 MIL/uL   Hemoglobin 9.9 (L) 12.0 - 15.0 g/dL   HCT 70.5 (L) 63.9 - 53.9 %   MCV 84.5 80.0 - 100.0 fL   MCH 28.4 26.0 - 34.0 pg   MCHC 33.7 30.0 - 36.0 g/dL   RDW 86.3 88.4 - 84.4 %   Platelets 180 150 - 400 K/uL   nRBC 0.0 0.0 - 0.2 %  Comprehensive metabolic panel     Status: Abnormal   Collection Time: 02/06/23 10:15 AM  Result Value Ref Range   Sodium 137 135 - 145 mmol/L   Potassium 3.2 (L) 3.5 - 5.1 mmol/L   Chloride 110 98 - 111 mmol/L   CO2 17 (L) 22 - 32 mmol/L   Glucose, Bld 91 70 - 99 mg/dL   BUN 6 6 - 20 mg/dL   Creatinine, Ser 9.43 0.44 - 1.00 mg/dL   Calcium 8.5 (L) 8.9 - 10.3 mg/dL   Total Protein 5.6 (L) 6.5 - 8.1 g/dL   Albumin 2.6 (L) 3.5 - 5.0 g/dL   AST 19 15 - 41 U/L   ALT 11 0 - 44 U/L   Alkaline Phosphatase 117 38 - 126 U/L   Total Bilirubin 0.5 0.0 - 1.2 mg/dL   GFR, Estimated >39 >39 mL/min   Anion gap 10 5 - 15     Imaging:  Reviewed from 01/25/23 by me  Comments  MFM CONSULT  Shamonique NICOLE Bearce is a 33 y.o. [id number] at [redacted]w[redacted]d here  for ultrasound and consultation.  Patient Active Problem List           Diagnosis         Date Noted          Cholestasis of pregnancy in third trimester  trimester12/27/2024          Flank pain, acute 12/27/2022          Marginal insertion of umbilical cord affecting  management of mother in second trimester     12/27/2022          Anemia of pregnancy        12/21/2022          History of preeclampsia    10/22/2022          Chronic headache disorder           06/12/2021          Fibromyalgia      11/15/2020          Generalized anxiety disorder        04/07/2014          Migraine 12/21/2013    RE cholestasis of pregnancy: The patient reports new onset  itching of the palms and soles that  is worse at night.  She  denies rash or any history of medical conditions with itching  that are known to her.  I discussed my concern for  cholestasis of pregnancy and its pregnancy implications.  We  discussed the need for assessing the bile acid levels as well  as antenatal testing.  I discussed the risk of stillbirth in  relation to bile acid levels and the need for possible early  delivery.  Patient reports good fetal movement we will  continue to monitor throughout her pregnancy.    RE history of preeclampsia: Patient has a history of  preeclampsia in a previous pregnancy.  Today her blood  pressure is 121/77.  She occasionally has floaters in her  vision but denies headache or scotomata. She has not  required BP meds since her previous delivery.    Sonographic findings  Single intrauterine pregnancy at 32w 4d.  Fetal cardiac activity:  Observed and appears normal.  Presentation: Cephalic.  Interval fetal anatomy appears normal.  Fetal biometry shows the estimated fetal weight at the 59  percentile.  Amniotic fluid volume: Within normal limits. MVP: 6.98 cm.   Placenta: Anterior.  Normal fetal movement and tone.  NST was reactive.    Recommendations  -Bile acids and CMP ordered today to confirm cholestasis of  pregnancy.  Even if her bile acids are normal this does not  negate the diagnosis since it is a clinical diagnosis.  Bile acid  should be repeated every 3 to 4 weeks until delivery.  -Weekly antenatal testing should be arranged  -Growth ultrasounds every 4 weeks until delivery  -Delivery timing will be based on blood pressure as well as  bile acid levels and cholestasis symptoms.  -OB provider can prescribe antihistamine or Actigall  if patient  desires at her next OB visit. ----------------------------------------------------------------------                   Delora Smaller, DO Electronically Signed Final Report   01/25/2023 01:34 pm     MDM & MAU COURSE  MDM:  HIGH  Headache resolved now 4/10 with medication administered in triage, BP's in mild range, patient is amicable for discharge Stable for Discharge and OB F/U as scheduled  Imaging:  Reviewed from 01/25/23 by me   MAU Course: Orders Placed This Encounter  Procedures   CBC   Comprehensive metabolic panel   Protein / creatinine ratio, urine   Maintain IV access   Notify physician (specify) Confirmatory reading of BP> 160/110 15 minutes later   Apply Hypertensive Disorders of Pregnancy Care Plan   Measure blood pressure   Discharge patient   Meds ordered this encounter  Medications   AND Linked Order Group    NIFEdipine  (PROCARDIA ) capsule 10 mg    NIFEdipine  (PROCARDIA ) capsule 20 mg    NIFEdipine  (PROCARDIA ) capsule 20 mg    labetalol  (NORMODYNE ) injection 40 mg   acetaminophen -caffeine  (EXCEDRIN  TENSION HEADACHE) 500-65 MG per tablet 2 tablet   metoCLOPramide  (REGLAN ) injection 10 mg   diphenhydrAMINE  (BENADRYL ) injection 25 mg     ASSESSMENT   1. Nonintractable headache, unspecified chronicity pattern, unspecified headache type   2. [redacted] weeks  gestation of pregnancy   3. Gestational hypertension, third trimester       PLAN  Discharge home in stable condition with return precautions.  F/U with [facility] as scheduled    Allergies as of 02/06/2023       Reactions   Amoxicillin Hives, Shortness Of Breath, Swelling  Swelling of hands, face, and lip Has patient had a PCN reaction causing immediate rash, facial/tongue/throat swelling, SOB or lightheadedness with hypotension: Unknown Has patient had a PCN reaction causing severe rash involving mucus membranes or skin necrosis: Yes Has patient had a PCN reaction that required hospitalization: No Has patient had a PCN reaction occurring within the last 10 years: No If all of the above answers are NO, then may proceed with Cephalosporin use.        Medication List     STOP taking these medications    oxyCODONE -acetaminophen  5-325 MG tablet Commonly known as: PERCOCET/ROXICET   tamsulosin  0.4 MG Caps capsule Commonly known as: FLOMAX        TAKE these medications    albuterol  (2.5 MG/3ML) 0.083% nebulizer solution Commonly known as: PROVENTIL  Take 3 mLs (2.5 mg total) by nebulization every 6 (six) hours as needed for wheezing or shortness of breath.   albuterol  108 (90 Base) MCG/ACT inhaler Commonly known as: VENTOLIN  HFA Inhale 2 puffs into the lungs every 6 (six) hours as needed for wheezing or shortness of breath.   aspirin 81 MG chewable tablet Chew 81 mg by mouth daily.   butalbital -acetaminophen -caffeine  50-325-40 MG tablet Commonly known as: FIORICET  Take 1 tablet by mouth 2 (two) times daily as needed for headache.   cetirizine  10 MG tablet Commonly known as: ZYRTEC  Take 10 mg by mouth daily.   diphenhydrAMINE  25 MG tablet Commonly known as: BENADRYL  Take 1 tablet (25 mg total) by mouth every 6 (six) hours as needed.   Excedrin  Tension Headache 500-65 MG Tabs per tablet Generic drug: acetaminophen -caffeine  Take 2 tablets by mouth every 6 (six) hours as  needed (Headache).   ferrous sulfate  325 (65 FE) MG tablet Take 1 tablet (325 mg total) by mouth every other day.   fluticasone  50 MCG/ACT nasal spray Commonly known as: FLONASE  Place 1 spray into both nostrils daily.   ondansetron  4 MG disintegrating tablet Commonly known as: ZOFRAN -ODT Take 1 tablet (4 mg total) by mouth every 8 (eight) hours as needed for nausea or vomiting.   PRENATAL GUMMIES PO Take by mouth.   promethazine  25 MG tablet Commonly known as: PHENERGAN  Take 1 tablet (25 mg total) by mouth every 6 (six) hours as needed for nausea or vomiting.       Olam Dalton, MSN, [facility], [facility] [employer]

## 2023-02-06 NOTE — MAU Note (Addendum)
.  Angelica Kim is a 33 y.o. at [redacted]w[redacted]d here in MAU reporting: headache that started yesterday rating about a 7/10. States that she tried taking her migraine meds last night about 2230 which did not help her sxs. Also reports having elevated BP readings yesterday afternoon 1900 170/111, 2300 146/960,, 0030 165/100, and this morning 0200 188/121. Reports occasionally seeing spots. Denies epigastric pain. Hx of pre-e in previous pregnancy. Recent dx of IHC. Denies LOF and VB. +FM.  Onset of complaint: Yesterday Pain score: 7/10 headache There were no vitals filed for this visit.   FHT:135 Lab orders placed from triage: urine obtained in traige

## 2023-02-07 ENCOUNTER — Ambulatory Visit (HOSPITAL_BASED_OUTPATIENT_CLINIC_OR_DEPARTMENT_OTHER): Payer: Managed Care, Other (non HMO) | Admitting: *Deleted

## 2023-02-07 ENCOUNTER — Ambulatory Visit: Payer: Managed Care, Other (non HMO) | Attending: Maternal & Fetal Medicine | Admitting: *Deleted

## 2023-02-07 VITALS — BP 156/97 | HR 91

## 2023-02-07 DIAGNOSIS — E669 Obesity, unspecified: Secondary | ICD-10-CM | POA: Insufficient documentation

## 2023-02-07 DIAGNOSIS — O99213 Obesity complicating pregnancy, third trimester: Secondary | ICD-10-CM | POA: Diagnosis not present

## 2023-02-07 DIAGNOSIS — O26643 Intrahepatic cholestasis of pregnancy, third trimester: Secondary | ICD-10-CM

## 2023-02-07 DIAGNOSIS — K831 Obstruction of bile duct: Secondary | ICD-10-CM | POA: Insufficient documentation

## 2023-02-07 DIAGNOSIS — Z3A34 34 weeks gestation of pregnancy: Secondary | ICD-10-CM | POA: Insufficient documentation

## 2023-02-07 DIAGNOSIS — O43193 Other malformation of placenta, third trimester: Secondary | ICD-10-CM | POA: Diagnosis not present

## 2023-02-07 NOTE — Procedures (Signed)
 Angelica Kim October 08, 1990 [redacted]w[redacted]d  Fetus A Non-Stress Test Interpretation for 02/07/23-NST only  Indication:  cholestasis, Obese, MCI  Fetal Heart Rate A Mode: External Baseline Rate (A): 140 bpm Variability: Moderate Accelerations: 15 x 15 Decelerations: Variable (X1) Multiple birth?: No  Uterine Activity Mode: Toco Contraction Frequency (min): none Resting Tone Palpated: Relaxed  Interpretation (Fetal Testing) Nonstress Test Interpretation: Reactive Comments: Tracing reviewed by Dr.  Arna

## 2023-02-14 ENCOUNTER — Other Ambulatory Visit: Payer: Self-pay

## 2023-02-14 ENCOUNTER — Ambulatory Visit: Payer: Managed Care, Other (non HMO) | Attending: Maternal & Fetal Medicine | Admitting: *Deleted

## 2023-02-14 ENCOUNTER — Ambulatory Visit: Payer: Managed Care, Other (non HMO)

## 2023-02-14 VITALS — BP 135/86

## 2023-02-14 VITALS — BP 143/89 | HR 89

## 2023-02-14 DIAGNOSIS — O26893 Other specified pregnancy related conditions, third trimester: Secondary | ICD-10-CM | POA: Diagnosis not present

## 2023-02-14 DIAGNOSIS — K831 Obstruction of bile duct: Secondary | ICD-10-CM

## 2023-02-14 DIAGNOSIS — O26643 Intrahepatic cholestasis of pregnancy, third trimester: Secondary | ICD-10-CM

## 2023-02-14 DIAGNOSIS — Z3A35 35 weeks gestation of pregnancy: Secondary | ICD-10-CM | POA: Insufficient documentation

## 2023-02-14 LAB — OB RESULTS CONSOLE GBS: GBS: NEGATIVE

## 2023-02-14 NOTE — Procedures (Addendum)
[name] Aug 07, 1990 [redacted]w[redacted]d  Fetus A Non-Stress Test Interpretation for 02/14/23 (NST only)  Indication:  Cholestasis  Fetal Heart Rate A Mode: External Baseline Rate (A): 135 bpm Variability: Moderate Accelerations: 15 x 15 Decelerations: None Multiple birth?: No  Uterine Activity Mode: Palpation, Toco Contraction Frequency (min): None Resting Tone Palpated: Relaxed Resting Time: Adequate  Interpretation (Fetal Testing) Nonstress Test Interpretation: Reactive Comments: Dr. Darra Lis reviewed tracing.

## 2023-02-21 ENCOUNTER — Other Ambulatory Visit: Payer: Self-pay | Admitting: Obstetrics and Gynecology

## 2023-02-22 ENCOUNTER — Inpatient Hospital Stay (HOSPITAL_COMMUNITY)
Admission: AD | Admit: 2023-02-22 | Discharge: 2023-02-23 | Disposition: A | Discharge status: Home / Self Care | Payer: Managed Care, Other (non HMO) | Source: Home / Self Care | Attending: Obstetrics and Gynecology | Admitting: Obstetrics and Gynecology

## 2023-02-22 ENCOUNTER — Ambulatory Visit: Payer: Managed Care, Other (non HMO) | Admitting: *Deleted

## 2023-02-22 ENCOUNTER — Ambulatory Visit: Payer: Managed Care, Other (non HMO) | Attending: Maternal & Fetal Medicine

## 2023-02-22 VITALS — BP 140/88 | HR 96

## 2023-02-22 DIAGNOSIS — O133 Gestational [pregnancy-induced] hypertension without significant proteinuria, third trimester: Secondary | ICD-10-CM | POA: Insufficient documentation

## 2023-02-22 DIAGNOSIS — Z3A36 36 weeks gestation of pregnancy: Secondary | ICD-10-CM

## 2023-02-22 DIAGNOSIS — E669 Obesity, unspecified: Secondary | ICD-10-CM

## 2023-02-22 DIAGNOSIS — R0789 Other chest pain: Secondary | ICD-10-CM | POA: Insufficient documentation

## 2023-02-22 DIAGNOSIS — O26643 Intrahepatic cholestasis of pregnancy, third trimester: Secondary | ICD-10-CM | POA: Insufficient documentation

## 2023-02-22 DIAGNOSIS — O43193 Other malformation of placenta, third trimester: Secondary | ICD-10-CM | POA: Insufficient documentation

## 2023-02-22 DIAGNOSIS — O10913 Unspecified pre-existing hypertension complicating pregnancy, third trimester: Secondary | ICD-10-CM | POA: Insufficient documentation

## 2023-02-22 DIAGNOSIS — O26893 Other specified pregnancy related conditions, third trimester: Secondary | ICD-10-CM | POA: Diagnosis not present

## 2023-02-22 DIAGNOSIS — F411 Generalized anxiety disorder: Secondary | ICD-10-CM

## 2023-02-22 DIAGNOSIS — O09293 Supervision of pregnancy with other poor reproductive or obstetric history, third trimester: Secondary | ICD-10-CM

## 2023-02-22 DIAGNOSIS — R9431 Abnormal electrocardiogram [ECG] [EKG]: Secondary | ICD-10-CM | POA: Insufficient documentation

## 2023-02-22 DIAGNOSIS — R519 Headache, unspecified: Secondary | ICD-10-CM | POA: Insufficient documentation

## 2023-02-22 DIAGNOSIS — Z3689 Encounter for other specified antenatal screening: Secondary | ICD-10-CM

## 2023-02-22 DIAGNOSIS — O99213 Obesity complicating pregnancy, third trimester: Secondary | ICD-10-CM | POA: Diagnosis not present

## 2023-02-22 DIAGNOSIS — K831 Obstruction of bile duct: Secondary | ICD-10-CM

## 2023-02-22 NOTE — MAU Provider Note (Signed)
History     CSN: [id number]  Arrival date and time: 02/22/23 2333   Event Date/Time   First Provider Initiated Contact with Patient 02/22/23 2351      No chief complaint on file.  Angelica Kim is a 33 y.o. [phone] at [redacted]w[redacted]d who receives care at [facility].  She presents today for headache and elevated blood pressure.  Patient states she has had elevated blood pressures at her last ob appt and at MFM today.  She states she took "migraine cocktail" for her HA, with no relief, around noon and 6pm. She rates her HA a 6/10 currently.  Patient also reports spotty vision, but reports it has been present for the past month.  She endorses usage of corrective contacts and endorses having a recent eye appt. She is currently taking urosodial BID for ICP, but did not take her evening dose.  She also report she is scheduled for IOL on Monday Jan 27th.  Patient endorses fetal movement and denies vaginal bleeding.  She endorses abdominal cramping and occasional contractions.   OB History     Gravida  4   Para  3   Term  1   Preterm  1   AB  0   Living  3      SAB  0   IAB  0   Ectopic  0   Multiple  0   Live Births  3           Past Medical History:  Diagnosis Date   Allergy to amoxicillin 02/07/2020   Ankylosing spondylitis (HCC)    Asthma    Diverticulitis 2014   Fibromyalgia    Gestational HTN, third trimester 12/07/2021   Gestational hypertension 08/03/2020   History of pre-eclampsia    History of premature delivery 02/07/2020   History of severe pre-eclampsia 10/22/2022   Hypertension in pregnancy, preeclampsia, severe, third trimester 12/08/2021   Kidney stones    Migraine    Ovarian cyst    Preterm labor    SVD (spontaneous vaginal delivery) 12/09/2021    Past Surgical History:  Procedure Laterality Date   COLONOSCOPY     ESOPHAGOGASTRODUODENOSCOPY ENDOSCOPY     spinal tap     WISDOM TOOTH EXTRACTION     WISDOM TOOTH EXTRACTION  2010    Family History   Problem Relation Age of Onset   Arthritis Mother    Deep vein thrombosis Mother    Kidney Stones Maternal Grandmother    Hypertension Maternal Grandmother    Heart disease Maternal Grandfather     Social History   Tobacco Use   Smoking status: Former    Current packs/day: 0.00    Types: Cigarettes    Quit date: 2014    Years since quitting: 11.0   Smokeless tobacco: Never  Vaping Use   Vaping status: Never Used  Substance Use Topics   Alcohol use: Not Currently   Drug use: No    Allergies:  Allergies  Allergen Reactions   Amoxicillin Hives, Shortness Of Breath and Swelling    Swelling of hands, face, and lip Has patient had a PCN reaction causing immediate rash, facial/tongue/throat swelling, SOB or lightheadedness with hypotension: Unknown Has patient had a PCN reaction causing severe rash involving mucus membranes or skin necrosis: Yes Has patient had a PCN reaction that required hospitalization: No Has patient had a PCN reaction occurring within the last 10 years: No If all of the above answers are "NO", then may proceed  with Cephalosporin use.     Medications Prior to Admission  Medication Sig Dispense Refill Last Dose/Taking   acetaminophen-caffeine (EXCEDRIN TENSION HEADACHE) 500-65 MG TABS per tablet Take 2 tablets by mouth every 6 (six) hours as needed (Headache). 60 tablet 0    albuterol (PROVENTIL) (2.5 MG/3ML) 0.083% nebulizer solution Take 3 mLs (2.5 mg total) by nebulization every 6 (six) hours as needed for wheezing or shortness of breath. 360 mL 0    albuterol (VENTOLIN HFA) 108 (90 Base) MCG/ACT inhaler Inhale 2 puffs into the lungs every 6 (six) hours as needed for wheezing or shortness of breath. 1 each 0    aspirin 81 MG chewable tablet Chew 81 mg by mouth daily.      butalbital-acetaminophen-caffeine (FIORICET) 50-325-40 MG tablet Take 1 tablet by mouth 2 (two) times daily as needed for headache.      cetirizine (ZYRTEC) 10 MG tablet Take 10 mg by  mouth daily. (Patient not taking: Reported on 10/25/2022)      diphenhydrAMINE (BENADRYL) 25 MG tablet Take 1 tablet (25 mg total) by mouth every 6 (six) hours as needed. 30 tablet 0    ferrous sulfate 325 (65 FE) MG tablet Take 1 tablet (325 mg total) by mouth every other day. 30 tablet 1    fluticasone (FLONASE) 50 MCG/ACT nasal spray Place 1 spray into both nostrils daily. 16 g 0    ondansetron (ZOFRAN-ODT) 4 MG disintegrating tablet Take 1 tablet (4 mg total) by mouth every 8 (eight) hours as needed for nausea or vomiting. 20 tablet 0    Prenatal MV & Min w/FA-DHA (PRENATAL GUMMIES PO) Take by mouth.      promethazine (PHENERGAN) 25 MG tablet Take 1 tablet (25 mg total) by mouth every 6 (six) hours as needed for nausea or vomiting. 30 tablet 2    ursodiol (ACTIGALL) 300 MG capsule Take 300 mg by mouth 2 (two) times daily.       Review of Systems  Eyes:  Positive for visual disturbance (Spots x 1 month).  Genitourinary:  Negative for difficulty urinating, dysuria, vaginal bleeding and vaginal discharge.  Neurological:  Positive for headaches. Negative for dizziness and numbness.   Physical Exam   Height 5' (1.524 m), weight 84.4 kg, last menstrual period 06/11/2022, unknown if currently breastfeeding.  Vitals:   02/22/23 2346 02/23/23 0015 02/23/23 0030 02/23/23 0045  BP: 132/88 (!) 141/91 (!) 141/95 136/80   02/23/23 0100 02/23/23 0116 02/23/23 0132 02/23/23 0146  BP: 132/69 115/87 100/66 124/81   02/23/23 0208 02/23/23 0231 02/23/23 0246  BP: 139/84 (!) 146/93 (!) 138/94     Physical Exam Vitals reviewed. Exam conducted with a chaperone present.  Constitutional:      Appearance: She is well-developed.  HENT:     Head: Normocephalic and atraumatic.  Eyes:     Conjunctiva/sclera: Conjunctivae normal.  Cardiovascular:     Rate and Rhythm: Normal rate.  Pulmonary:     Effort: Pulmonary effort is normal. No respiratory distress.  Abdominal:     Comments: Gravid, Appears LGA   Musculoskeletal:        General: Normal range of motion.     Cervical back: Normal range of motion.  Skin:    General: Skin is warm and dry.  Neurological:     Mental Status: She is alert and oriented to person, place, and time.  Psychiatric:        Mood and Affect: Mood normal.  Behavior: Behavior normal.    Fetal Assessment 140 bpm, Mod Var, -Decels, +15x15 Accels Toco: Irritability  MAU Course   Results for orders placed or performed during the hospital encounter of 02/22/23 (from the past 24 hours)  Protein / creatinine ratio, urine     Status: Abnormal   Collection Time: 02/23/23 12:05 AM  Result Value Ref Range   Creatinine, Urine 161 mg/dL   Total Protein, Urine 36 mg/dL   Protein Creatinine Ratio 0.22 (H) 0.00 - 0.15 mg/mg[Cre]  CBC     Status: Abnormal   Collection Time: 02/23/23 12:39 AM  Result Value Ref Range   WBC 13.5 (H) 4.0 - 10.5 K/uL   RBC 3.73 (L) 3.87 - 5.11 MIL/uL   Hemoglobin 10.6 (L) 12.0 - 15.0 g/dL   HCT 91.4 (L) 78.2 - 95.6 %   MCV 83.4 80.0 - 100.0 fL   MCH 28.4 26.0 - 34.0 pg   MCHC 34.1 30.0 - 36.0 g/dL   RDW 21.3 08.6 - 57.8 %   Platelets 201 150 - 400 K/uL   nRBC 0.0 0.0 - 0.2 %   [place] MFM OB FOLLOW UP Result Date: 02/22/2023 ----------------------------------------------------------------------  OBSTETRICS REPORT                       (Signed Final 02/22/2023 10:48 am) ---------------------------------------------------------------------- Patient Info  ID #:       [id number]                          D.O.B.:  02-21-1990 (32 yrs)(F)  Name:       Angelica Kim [facility]               Visit Date: 02/22/2023 09:49 am ---------------------------------------------------------------------- Performed By  Attending:        Ma Rings MD         Ref. Address:     [address]                                                             [address]                                                             [place]                                                              [address]  Performed By:     Eden Lathe BS      [facility]                    RDMS RVT  [facility]  Referred By:      [facility] ---------------------------------------------------------------------- Orders  #  Description                           Code        Ordered By  1  [place] MFM OB FOLLOW UP                   76816.01    BURK SCHAIBLE  2  [place] MFM FETAL BPP WO NON               76819.01    [facility]     STRESS ----------------------------------------------------------------------  #  Order #                     Accession #                Episode #  1  [id number]                   [id number]                 [id number]  2  [id number]                   [id number]                 [id number] ---------------------------------------------------------------------- Indications  Cholestasis of pregnancy, third trimester      G64.403K74.2  Marginal insertion 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 Cord Insertion:      Marg insertion previously seen  Amniotic Fluid  AFI FV:      Within normal limits  AFI Sum(cm)     %Tile       Largest Pocket(cm)  11.19  32          4.01  RUQ(cm)       RLQ(cm)       LUQ(cm)        LLQ(cm)  2.79           1.2           4.01           3.19 ---------------------------------------------------------------------- Biophysical Evaluation  Amniotic F.V:   Within normal limits       F. Tone:        Observed  F. Movement:    Observed                   Score:          8/8  F. Breathing:   Observed ---------------------------------------------------------------------- Biometry  BPD:        88  mm     G. Age:  35w 4d         33  %    CI:        72.89   %    70 - 86                                                          FL/HC:      21.5   %    20.8 - 22.6  HC:      327.7  mm     G. Age:  37w 1d         37  %    HC/AC:      0.94        0.92 - 1.05  AC:      350.4  mm     G. Age:  39w 0d         98  %    FL/BPD:     80.0   %    71 - 87  FL:       70.4  mm     G. Age:  36w 1d         35  %    FL/AC:      20.1   %    20 - 24  LV:        3.2  mm  Est. FW:    3280  gm      7 lb 4 oz     82  % ---------------------------------------------------------------------- OB History  Gravidity:    4         Term:   3        Prem:   0        SAB:   0  TOP:          0       Ectopic:  0        Living: 3 ---------------------------------------------------------------------- Gestational Age  LMP:           36w 4d        Date:  06/11/22                  EDD:   03/18/23  U/S Today:     37w 0d  EDD:   03/15/23  Best:          36w 4d     Det. By:  LMP  (06/11/22)          EDD:   03/18/23 ---------------------------------------------------------------------- Anatomy  Cranium:               Appears normal         Aortic Arch:            Previously seen  Cavum:                 Appears normal         Ductal Arch:            Previously seen  Ventricles:            Appears normal         Diaphragm:              Appears normal  Choroid Plexus:        Previously seen        Stomach:                Appears normal, left                                                                        sided  Cerebellum:             Previously seen        Abdomen:                Previously seen  Posterior Fossa:       Previously seen        Abdominal Wall:         Previously seen  Face:                  Orbits and profile     Cord Vessels:           Previously seen                         previously seen  Lips:                  Previously seen        Kidneys:                Appear normal  Thoracic:              Previously seen        Bladder:                Appears normal  Heart:                 Appears normal         Spine:                  Previously seen                         (4CH, axis, and                         situs)  RVOT:                  Previously seen        Upper Extremities:      Previously seen  LVOT:                  Appears normal         Lower Extremities:      Previously seen ---------------------------------------------------------------------- Cervix Uterus Adnexa  Cervix  Not visualized (advanced GA >24wks)  Uterus  No abnormality visualized.  Right Ovary  Not visualized.  Left Ovary  Not visualized.  Cul De Sac  No free fluid seen.  Adnexa  No abnormality visualized ---------------------------------------------------------------------- Comments  This patient was seen due to cholestasis of pregnancy  currently treated with Actigall 300 mg twice a day.  She  continues to report itching symptoms.  Her blood pressure  today was 140/88.  She notes an occasional headache.  She was informed that the fetal growth and amniotic fluid  level appears appropriate for her gestational age.  A BPP performed today was 8 out of 8.  Preeclampsia precautions were reviewed today.  She was  advised to go to the hospital should she continue to have  headaches and should her blood pressures continue to be  elevated at home.  She is already scheduled for delivery on February 25, 2023 (in  3 days).  No further exams were scheduled in our office. ----------------------------------------------------------------------                   Ma Rings,  MD Electronically Signed Final Report   02/22/2023 10:48 am ----------------------------------------------------------------------   [place] MFM FETAL BPP WO NON STRESS Result Date: 02/22/2023 ----------------------------------------------------------------------  OBSTETRICS REPORT                       (Signed Final 02/22/2023 10:48 am) ---------------------------------------------------------------------- Patient Info  ID #:       [id number]                          D.O.B.:  07-Nov-1990 (32 yrs)(F)  Name:       Angelica Kim [facility]               Visit Date: 02/22/2023 09:49 am ---------------------------------------------------------------------- Performed By  Attending:        Ma Rings MD         Ref. Address:     [address]                                                             [address]                                                             [place]  [address]  Performed By:     Eden Lathe BS      Location[facility]         [facility]                    RDMS RVT                                 [facility]  Referred By:      [facility] GYN ---------------------------------------------------------------------- Orders  #  Description                           Code        Ordered By  1  [place] MFM OB FOLLOW UP                   76816.01    BURK SCHAIBLE  2  [place] MFM FETAL BPP WO NON               76819.01    [facility]     STRESS ----------------------------------------------------------------------  #  Order #                     Accession #                Episode #  1  [id number]                   [id number]                 [id number]  2  [id number]                   [id number]                 [id number] ---------------------------------------------------------------------- Indications   Cholestasis of pregnancy, third trimester      H47.425Z56.3  Marginal insertion of umbilical cord affecting O43.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 Cord Insertion:      Marg insertion previously seen  Amniotic Fluid  AFI FV:      Within normal limits  AFI Sum(cm)     %Tile       Largest Pocket(cm)  11.19           32          4.01  RUQ(cm)       RLQ(cm)       LUQ(cm)        LLQ(cm)  2.79          1.2           4.01           3.19 ---------------------------------------------------------------------- Biophysical Evaluation  Amniotic F.V:   Within normal limits       F. Tone:        Observed  F. Movement:    Observed                   Score:          8/8  F. Breathing:   Observed ---------------------------------------------------------------------- Biometry  BPD:        88  mm     G. Age:  35w 4d         33  %    CI:        72.89   %    70 - 86                                                          FL/HC:      21.5   %    20.8 - 22.6  HC:      327.7  mm     G. Age:  37w 1d         37  %    HC/AC:      0.94        0.92 - 1.05  AC:      350.4  mm     G. Age:  39w 0d         98  %    FL/BPD:     80.0   %    71 - 87  FL:       70.4  mm     G. Age:  36w 1d         35  %    FL/AC:      20.1   %    20 - 24  LV:        3.2  mm  Est. FW:    3280  gm      7 lb 4 oz     82  % ---------------------------------------------------------------------- OB History  Gravidity:    4         Term:   3        Prem:   0         SAB:   0  TOP:          0       Ectopic:  0        Living: 3 ---------------------------------------------------------------------- Gestational Age  LMP:           36w 4d        Date:  06/11/22                  EDD:   03/18/23  U/S Today:     37w 0d                                        EDD:   03/15/23  Best:          36w 4d     Det. By:  LMP  (06/11/22)          EDD:   03/18/23 ---------------------------------------------------------------------- Anatomy  Cranium:               Appears normal         Aortic Arch:            Previously seen  Cavum:                 Appears normal         Ductal Arch:            Previously seen  Ventricles:            Appears normal         Diaphragm:              Appears normal  Choroid Plexus:        Previously seen        Stomach:                Appears normal, left                                                                        sided  Cerebellum:            Previously seen        Abdomen:                Previously seen  Posterior Fossa:       Previously seen        Abdominal Wall:         Previously seen  Face:                  Orbits and profile     Cord Vessels:           Previously seen                         previously seen  Lips:                  Previously seen        Kidneys:                Appear normal  Thoracic:              Previously seen        Bladder:  Appears normal  Heart:                 Appears normal         Spine:                  Previously seen                         (4CH, axis, and                         situs)  RVOT:                  Previously seen        Upper Extremities:      Previously seen  LVOT:                  Appears normal         Lower Extremities:      Previously seen ---------------------------------------------------------------------- Cervix Uterus Adnexa  Cervix  Not visualized (advanced GA >24wks)  Uterus  No abnormality visualized.  Right Ovary  Not visualized.  Left Ovary  Not visualized.  Cul De Sac  No free fluid  seen.  Adnexa  No abnormality visualized ---------------------------------------------------------------------- Comments  This patient was seen due to cholestasis of pregnancy  currently treated with Actigall 300 mg twice a day.  She  continues to report itching symptoms.  Her blood pressure  today was 140/88.  She notes an occasional headache.  She was informed that the fetal growth and amniotic fluid  level appears appropriate for her gestational age.  A BPP performed today was 8 out of 8.  Preeclampsia precautions were reviewed today.  She was  advised to go to the hospital should she continue to have  headaches and should her blood pressures continue to be  elevated at home.  She is already scheduled for delivery on February 25, 2023 (in  3 days).  No further exams were scheduled in our office. ----------------------------------------------------------------------                   Ma Rings, MD Electronically Signed Final Report   02/22/2023 10:48 am ----------------------------------------------------------------------    MDM Physical Exam Labs: CBC, CMP, PC Ratio Measure BPQ15 min EFM Pain Management Start IV with LR Bolus Ursodiol Assessment and Plan  33 year old G4P1103  SIUP at 36.5 weeks Cat I FT GHTN Headache  -POC Reviewed. -Discussed giving IV Reglan and Benadryl.  Patient agreeable. -Start IV with bolus f/b medications. -Discussed diagnosis of GHTN with elevated bps.  -Exam performed. -Will give evening dose of Urosodial. -Monitor and reassess.   Cherre Robins MSN, CNM 02/22/2023, 11:51 PM   Reassessment (2:00 AM) -Patient reports improvement in HA (4/10), but reports chest tightness. Declines additional pain meds. -Informed of labs that are not reflective of PreEclampsia and reassured that bps normotensive. -Discussed obtaining EKG and if normal will discharge with plan to return on Monday for IOL or earlier as appropriate. -Patient expressing concern with discharge  stating that she does not feel comfortable going home d/t h/o PreEclampsia in previous pregnancies. -Informed that she does have a diagnosis of GHTN, but no diagnosis of PreEclampsia. -Patient expressing concern with spotty vision and provider reiterates that those symptoms have been present x 1 month. -Further reiterated that labs and blood pressures are not suggestive or currently supportive of PreEclampsia diagnosis. -However, with patient  concern provider offers to call primary ob to discuss patient status, evaluation, interventions, and results. Patient agreeable. -Dr. Sherlean Foot contacted and informed of all findings. Agrees with plan for discharge, but acknowledges patient's concern and advises that she return tomorrow for blood pressure check. -EKG returns normal and provider back to bedside with MD suggestion. -Patient states that she will not return d/t childcare issues and continues to express concern with discharge. -Provider again offers support while reiterating that their is no current indication for admission. -Patient visually upset and states she has no further questions. Provider strongly encourages her to return tomorrow for blood pressure check.  -Precautions given and nurse instructed to reiterate. -Encouraged to call primary office or return to MAU if symptoms worsen or with the onset of new symptoms. -Discharged to home in stable condition.  Cherre Robins MSN, CNM Advanced Practice Provider, [facility]

## 2023-02-22 NOTE — MAU Note (Signed)
..  Jimena Wieczorek is a 33 y.o. at [redacted]w[redacted]d here in MAU reporting: elevated blood pressure at MFM 140/88, was told to come into MAU if headache worsened or didn't improve with medication. 132 Visual changes for a month: spots when she stares at something for too long. Excedrin migraine, benedryl, and reglan at 1800 and did not help.  Chest tightness that began around 2000 +FM. Denies regular contractions, vaginal bleeding, or leaking of fluid.   Pain score: 6/10 headache, 4/10 chest  Vitals:   02/22/23 2346  BP: 132/88  Pulse: 96  Resp: 18  Temp: 98.2 F (36.8 C)  SpO2: 100%     FHT:145

## 2023-02-23 ENCOUNTER — Inpatient Hospital Stay (HOSPITAL_COMMUNITY)
Admission: AD | Admit: 2023-02-23 | Discharge: 2023-02-23 | Disposition: A | Discharge status: Home / Self Care | Payer: Managed Care, Other (non HMO) | Attending: Obstetrics and Gynecology | Admitting: Obstetrics and Gynecology

## 2023-02-23 ENCOUNTER — Encounter (HOSPITAL_COMMUNITY): Payer: Self-pay | Admitting: Obstetrics and Gynecology

## 2023-02-23 ENCOUNTER — Other Ambulatory Visit: Payer: Self-pay

## 2023-02-23 DIAGNOSIS — R519 Headache, unspecified: Secondary | ICD-10-CM | POA: Insufficient documentation

## 2023-02-23 DIAGNOSIS — Z3A36 36 weeks gestation of pregnancy: Secondary | ICD-10-CM

## 2023-02-23 DIAGNOSIS — O26893 Other specified pregnancy related conditions, third trimester: Secondary | ICD-10-CM | POA: Insufficient documentation

## 2023-02-23 DIAGNOSIS — O133 Gestational [pregnancy-induced] hypertension without significant proteinuria, third trimester: Secondary | ICD-10-CM

## 2023-02-23 LAB — CBC
HCT: 31.1 % — ABNORMAL LOW (ref 36.0–46.0)
Hemoglobin: 10.6 g/dL — ABNORMAL LOW (ref 12.0–15.0)
MCH: 28.4 pg (ref 26.0–34.0)
MCHC: 34.1 g/dL (ref 30.0–36.0)
MCV: 83.4 fL (ref 80.0–100.0)
Platelets: 201 10*3/uL (ref 150–400)
RBC: 3.73 MIL/uL — ABNORMAL LOW (ref 3.87–5.11)
RDW: 13.7 % (ref 11.5–15.5)
WBC: 13.5 10*3/uL — ABNORMAL HIGH (ref 4.0–10.5)
nRBC: 0 % (ref 0.0–0.2)

## 2023-02-23 LAB — COMPREHENSIVE METABOLIC PANEL
ALT: 12 U/L (ref 0–44)
AST: 28 U/L (ref 15–41)
Albumin: 2.9 g/dL — ABNORMAL LOW (ref 3.5–5.0)
Alkaline Phosphatase: 163 U/L — ABNORMAL HIGH (ref 38–126)
Anion gap: 13 (ref 5–15)
BUN: <5 mg/dL — ABNORMAL LOW (ref 6–20)
CO2: 18 mmol/L — ABNORMAL LOW (ref 22–32)
Calcium: 8.6 mg/dL — ABNORMAL LOW (ref 8.9–10.3)
Chloride: 105 mmol/L (ref 98–111)
Creatinine, Ser: 0.54 mg/dL (ref 0.44–1.00)
GFR, Estimated: >60 mL/min (ref >60)
Glucose, Bld: 71 mg/dL (ref 70–99)
Potassium: 3.3 mmol/L — ABNORMAL LOW (ref 3.5–5.1)
Sodium: 136 mmol/L (ref 135–145)
Total Bilirubin: 1 mg/dL (ref 0.0–1.2)
Total Protein: 6.2 g/dL — ABNORMAL LOW (ref 6.5–8.1)

## 2023-02-23 LAB — PROTEIN / CREATININE RATIO, URINE
Creatinine, Urine: 161 mg/dL
Protein Creatinine Ratio: 0.22 mg/mg{creat} — ABNORMAL HIGH (ref 0.00–0.15)
Total Protein, Urine: 36 mg/dL

## 2023-02-23 MED ORDER — OXYCODONE-ACETAMINOPHEN 5-325 MG PO TABS
2.0000 | ORAL_TABLET | Freq: Once | ORAL | Status: AC
  Administered 2023-02-23: 2 via ORAL
  Filled 2023-02-23: qty 2

## 2023-02-23 MED ORDER — CYCLOBENZAPRINE HCL 5 MG PO TABS
10.0000 mg | ORAL_TABLET | Freq: Once | ORAL | Status: AC
  Administered 2023-02-23: 10 mg via ORAL
  Filled 2023-02-23: qty 2

## 2023-02-23 MED ORDER — URSODIOL 300 MG PO CAPS
300.0000 mg | ORAL_CAPSULE | Freq: Once | ORAL | Status: AC
  Administered 2023-02-23: 300 mg via ORAL
  Filled 2023-02-23: qty 1

## 2023-02-23 MED ORDER — METOCLOPRAMIDE HCL 5 MG/ML IJ SOLN
10.0000 mg | Freq: Once | INTRAMUSCULAR | Status: AC
  Administered 2023-02-23: 10 mg via INTRAVENOUS
  Filled 2023-02-23: qty 2

## 2023-02-23 MED ORDER — LACTATED RINGERS IV BOLUS
1000.0000 mL | Freq: Once | INTRAVENOUS | Status: AC
  Administered 2023-02-23: 1000 mL via INTRAVENOUS

## 2023-02-23 MED ORDER — DIPHENHYDRAMINE HCL 50 MG/ML IJ SOLN
25.0000 mg | Freq: Once | INTRAMUSCULAR | Status: AC
  Administered 2023-02-23: 25 mg via INTRAVENOUS
  Filled 2023-02-23: qty 1

## 2023-02-23 NOTE — MAU Note (Signed)
PT discharged home. Pt expressed concern bout going home. Fe;t like her B/P is a little higher now and would like to stay for her induction. Re enforced teaching from Midwife about her labs being normal and plan to induce in 2 days when she is 37 weeks. Midwife offered to have her come back tomorrow for a blood pressure check She then asked who does she talk to to cancel her induction "since they are not concerned today they will will not be concerned 2 days from now. " Again tried to explain that Her OB on call has been informed of all the test results and her b/p's and is still recommending waiting for her induction . Pt stated she would go home but refused to sign her discharge paperwork.

## 2023-02-23 NOTE — Progress Notes (Signed)
Patient given discharge instructions and the patient and support person became upset about leaving. Support partner encouraging the patient to not sign discharge paperwork. Patient stated she did not want to talk to anyone else about discharge home even though patient is stating she does not understand why she is leaving. Patient once again educated about BP's, lab results, and treating headaches at home and to come back for IOL on Monday 02/25/23.  I, the nurse, stepped out of the room once foul language and rude comments were being made by support partner.

## 2023-02-23 NOTE — MAU Provider Note (Signed)
History     CSN: [id number]  Arrival date and time: 02/23/23 0754   Event Date/Time   First Provider Initiated Contact with Patient 02/23/23 [phone]      Chief Complaint  Patient presents with   Elevated BP   Headache   HPI  Angelica Kim is a 33 y.o. [id number] at [redacted]w[redacted]d who presents for evaluation of a headache. Patient reports the headache that she had last night has returned. Patient rates the pain as a 7/10 and has not tried anything for the pain. She states she ate at hotdog at 0400 and has had some water since discharge. She denies any new visual symptoms or epigastric pain.She denies any vaginal bleeding, discharge, and leaking of fluid. Denies any constipation, diarrhea or any urinary complaints. Reports normal fetal movement.   OB History     Gravida  4   Para  3   Term  1   Preterm  1   AB  0   Living  3      SAB  0   IAB  0   Ectopic  0   Multiple  0   Live Births  3           Past Medical History:  Diagnosis Date   Allergy to amoxicillin 02/07/2020   Ankylosing spondylitis (HCC)    Asthma    Diverticulitis 2014   Fibromyalgia    Gestational HTN, third trimester 12/07/2021   Gestational hypertension 08/03/2020   History of pre-eclampsia    History of premature delivery 02/07/2020   History of severe pre-eclampsia 10/22/2022   Hypertension in pregnancy, preeclampsia, severe, third trimester 12/08/2021   Kidney stones    Migraine    Ovarian cyst    Preterm labor    SVD (spontaneous vaginal delivery) 12/09/2021    Past Surgical History:  Procedure Laterality Date   COLONOSCOPY     ESOPHAGOGASTRODUODENOSCOPY ENDOSCOPY     spinal tap     WISDOM TOOTH EXTRACTION     WISDOM TOOTH EXTRACTION  2010    Family History  Problem Relation Age of Onset   Arthritis Mother    Deep vein thrombosis Mother    Kidney Stones Maternal Grandmother    Hypertension Maternal Grandmother    Heart disease Maternal Grandfather     Social History    Tobacco Use   Smoking status: Former    Current packs/day: 0.00    Types: Cigarettes    Quit date: 2014    Years since quitting: 11.0   Smokeless tobacco: Never  Vaping Use   Vaping status: Never Used  Substance Use Topics   Alcohol use: Not Currently   Drug use: No    Allergies:  Allergies  Allergen Reactions   Amoxicillin Hives, Shortness Of Breath and Swelling    Swelling of hands, face, and lip Has patient had a PCN reaction causing immediate rash, facial/tongue/throat swelling, SOB or lightheadedness with hypotension: Unknown Has patient had a PCN reaction causing severe rash involving mucus membranes or skin necrosis: Yes Has patient had a PCN reaction that required hospitalization: No Has patient had a PCN reaction occurring within the last 10 years: No If all of the above answers are "NO", then may proceed with Cephalosporin use.     Medications Prior to Admission  Medication Sig Dispense Refill Last Dose/Taking   aspirin 81 MG chewable tablet Chew 81 mg by mouth daily.   02/23/2023 Morning   ferrous sulfate 325 (65 FE) MG  tablet Take 1 tablet (325 mg total) by mouth every other day. 30 tablet 1 02/23/2023 Morning   Prenatal MV & Min w/FA-DHA (PRENATAL GUMMIES PO) Take by mouth.   02/23/2023 Morning   acetaminophen-caffeine (EXCEDRIN TENSION HEADACHE) 500-65 MG TABS per tablet Take 2 tablets by mouth every 6 (six) hours as needed (Headache). 60 tablet 0    albuterol (PROVENTIL) (2.5 MG/3ML) 0.083% nebulizer solution Take 3 mLs (2.5 mg total) by nebulization every 6 (six) hours as needed for wheezing or shortness of breath. 360 mL 0    albuterol (VENTOLIN HFA) 108 (90 Base) MCG/ACT inhaler Inhale 2 puffs into the lungs every 6 (six) hours as needed for wheezing or shortness of breath. 1 each 0    butalbital-acetaminophen-caffeine (FIORICET) 50-325-40 MG tablet Take 1 tablet by mouth 2 (two) times daily as needed for headache.      cetirizine (ZYRTEC) 10 MG tablet Take 10  mg by mouth daily. (Patient not taking: Reported on 10/25/2022)      diphenhydrAMINE (BENADRYL) 25 MG tablet Take 1 tablet (25 mg total) by mouth every 6 (six) hours as needed. 30 tablet 0    fluticasone (FLONASE) 50 MCG/ACT nasal spray Place 1 spray into both nostrils daily. 16 g 0    ondansetron (ZOFRAN-ODT) 4 MG disintegrating tablet Take 1 tablet (4 mg total) by mouth every 8 (eight) hours as needed for nausea or vomiting. 20 tablet 0    promethazine (PHENERGAN) 25 MG tablet Take 1 tablet (25 mg total) by mouth every 6 (six) hours as needed for nausea or vomiting. 30 tablet 2    ursodiol (ACTIGALL) 300 MG capsule Take 300 mg by mouth 2 (two) times daily.       Review of Systems  Constitutional: Negative.  Negative for chills, fatigue and fever.  HENT: Negative.    Respiratory: Negative.  Negative for shortness of breath.   Cardiovascular: Negative.  Negative for chest pain.  Gastrointestinal: Negative.  Negative for abdominal pain, constipation, diarrhea, nausea and vomiting.  Genitourinary: Negative.  Negative for dysuria.  Neurological:  Positive for headaches. Negative for dizziness.   Physical Exam   Blood pressure (!) 147/90, pulse (!) 102, temperature 98.7 F (37.1 C), temperature source Oral, resp. rate 18, height 5' (1.524 m), weight 86 kg, last menstrual period 06/11/2022, SpO2 100%, unknown if currently breastfeeding.  Patient Vitals for the past 24 hrs:  BP Temp Temp src Pulse Resp SpO2 Height Weight  02/23/23 0821 (!) 147/90 -- -- (!) 102 -- -- -- --  02/23/23 0809 (!) 143/94 98.7 F (37.1 C) Oral (!) 105 18 100 % -- --  02/23/23 0804 -- -- -- -- -- -- 5' (1.524 m) 86 kg    Physical Exam Vitals and nursing note reviewed.  Constitutional:      General: She is not in acute distress.    Appearance: She is well-developed.  HENT:     Head: Normocephalic.  Eyes:     Pupils: Pupils are equal, round, and reactive to light.  Cardiovascular:     Rate and Rhythm: Normal  rate and regular rhythm.     Heart sounds: Normal heart sounds.  Pulmonary:     Effort: Pulmonary effort is normal. No respiratory distress.     Breath sounds: Normal breath sounds.  Abdominal:     General: Bowel sounds are normal. There is no distension.     Palpations: Abdomen is soft.     Tenderness: There is no abdominal tenderness.  Skin:    General: Skin is warm and dry.  Neurological:     Mental Status: She is alert and oriented to person, place, and time.  Psychiatric:        Mood and Affect: Mood normal.        Behavior: Behavior normal.        Thought Content: Thought content normal.        Judgment: Judgment normal.     Fetal Tracing:  Baseline: 125 Variability: moderate Accels: 15x15 Decels: none  Toco: none  MAU Course  Procedures  Results for orders placed or performed during the hospital encounter of 02/22/23 (from the past 24 hours)  Protein / creatinine ratio, urine     Status: Abnormal   Collection Time: 02/23/23 12:05 AM  Result Value Ref Range   Creatinine, Urine 161 mg/dL   Total Protein, Urine 36 mg/dL   Protein Creatinine Ratio 0.22 (H) 0.00 - 0.15 mg/mg[Cre]  CBC     Status: Abnormal   Collection Time: 02/23/23 12:39 AM  Result Value Ref Range   WBC 13.5 (H) 4.0 - 10.5 K/uL   RBC 3.73 (L) 3.87 - 5.11 MIL/uL   Hemoglobin 10.6 (L) 12.0 - 15.0 g/dL   HCT 16.1 (L) 09.6 - 04.5 %   MCV 83.4 80.0 - 100.0 fL   MCH 28.4 26.0 - 34.0 pg   MCHC 34.1 30.0 - 36.0 g/dL   RDW 40.9 81.1 - 91.4 %   Platelets 201 150 - 400 K/uL   nRBC 0.0 0.0 - 0.2 %  Comprehensive metabolic panel     Status: Abnormal   Collection Time: 02/23/23 12:39 AM  Result Value Ref Range   Sodium 136 135 - 145 mmol/L   Potassium 3.3 (L) 3.5 - 5.1 mmol/L   Chloride 105 98 - 111 mmol/L   CO2 18 (L) 22 - 32 mmol/L   Glucose, Bld 71 70 - 99 mg/dL   BUN <5 (L) 6 - 20 mg/dL   Creatinine, Ser 7.82 0.44 - 1.00 mg/dL   Calcium 8.6 (L) 8.9 - 10.3 mg/dL   Total Protein 6.2 (L) 6.5 -  8.1 g/dL   Albumin 2.9 (L) 3.5 - 5.0 g/dL   AST 28 15 - 41 U/L   ALT 12 0 - 44 U/L   Alkaline Phosphatase 163 (H) 38 - 126 U/L   Total Bilirubin 1.0 0.0 - 1.2 mg/dL   GFR, Estimated >95 >62 mL/min   Anion gap 13 5 - 15       MDM Prenatal records from community office reviewed. Pregnancy complicated by gestational hypertension  Labs reviewed from 0200.   Reviewed no indication to repeat labs at this time.   Discussed plan to treat headache since it has been many hours since she has treated her pain. Patient agreeable. Encouraged patient to rest quietly in dark room with TV/phones off. Encouraged patient to eat breakfast while treating pain.  Flexeril PO  Reassessment showed no improvement of pain, patient declines to eat while in MAU with phone and TV on.   Percocet PO- upon reassessment, patient sleeping. Patient aroused to review pain and patient reports some improvement.   Dr. Salvadore Dom notified of patient representation, non severe BPs and slight improvement of HA while in MAU. Patient desired MD on call to be aware of complaints. Patient ok to keep scheduled IOL on 1/27.  Assessment and Plan   1. Pregnancy headache in third trimester   2. [redacted] weeks gestation of pregnancy  3. Gestational hypertension, third trimester     -Discharge home in stable condition -Hypertension precautions discussed -Patient advised to follow-up with [facility] for IOL on Monday. -Patient may return to MAU as needed or if her condition were to change or worsen  Rolm Bookbinder, CNM 02/23/2023, 8:23 AM

## 2023-02-23 NOTE — Progress Notes (Signed)
Patient requested to speak to provider one more time before leaving. Patients support person not present at this time. Patient expressed social issues with childcare for IOL on Monday and that she does not have anyone to watch her other children that day. Lake Bells CNM offered different solutions for IOL but patient was not accepting a later date.   Patient crying but pleasant upon leaving. Patient signed discharge paper.

## 2023-02-23 NOTE — Progress Notes (Signed)
Walked into patients room to reevaluate pain after pain medication. Patient sleeping and had to be woken up to ask her to rate her pain. Patient stated her headache was 6/10. C. Lloyd Huger CNM notified.

## 2023-02-23 NOTE — MAU Note (Signed)
Angelica Kim is a 33 y.o. at [redacted]w[redacted]d here in MAU reporting: she was discharged this morning and instructed to return if HA persist.  Reports HA has worsened and BP's 157/98 & 155/98.  States has being seeing spots for approximately 1 month, denies epigastric pain. Endorses +FM, denies VB or LOF.  LMP: NA Onset of complaint: ongoing Pain score: 7 Vitals:   02/23/23 0809  BP: (!) 143/94  Pulse: (!) 105  Resp: 18  Temp: 98.7 F (37.1 C)  SpO2: 100%     FHT: 138 bpm  Lab orders placed from triage: None

## 2023-02-23 NOTE — Discharge Instructions (Signed)

## 2023-02-25 ENCOUNTER — Inpatient Hospital Stay (HOSPITAL_COMMUNITY): Payer: Managed Care, Other (non HMO)

## 2023-02-25 ENCOUNTER — Inpatient Hospital Stay (HOSPITAL_COMMUNITY): Payer: Managed Care, Other (non HMO) | Admitting: Anesthesiology

## 2023-02-25 ENCOUNTER — Encounter (HOSPITAL_COMMUNITY): Payer: Self-pay | Admitting: Obstetrics and Gynecology

## 2023-02-25 ENCOUNTER — Inpatient Hospital Stay (HOSPITAL_COMMUNITY)
Admission: AD | Admit: 2023-02-25 | Discharge: 2023-03-01 | DRG: 807 | Disposition: A | Discharge status: Home / Self Care | Payer: Managed Care, Other (non HMO) | Attending: Obstetrics and Gynecology | Admitting: Obstetrics and Gynecology

## 2023-02-25 ENCOUNTER — Other Ambulatory Visit: Payer: Self-pay

## 2023-02-25 DIAGNOSIS — O99214 Obesity complicating childbirth: Secondary | ICD-10-CM | POA: Diagnosis present

## 2023-02-25 DIAGNOSIS — Z87891 Personal history of nicotine dependence: Secondary | ICD-10-CM | POA: Diagnosis not present

## 2023-02-25 DIAGNOSIS — O134 Gestational [pregnancy-induced] hypertension without significant proteinuria, complicating childbirth: Secondary | ICD-10-CM | POA: Diagnosis present

## 2023-02-25 DIAGNOSIS — Z23 Encounter for immunization: Secondary | ICD-10-CM | POA: Diagnosis not present

## 2023-02-25 DIAGNOSIS — O1415 Severe pre-eclampsia, complicating the puerperium: Secondary | ICD-10-CM | POA: Diagnosis not present

## 2023-02-25 DIAGNOSIS — Z3A37 37 weeks gestation of pregnancy: Secondary | ICD-10-CM

## 2023-02-25 DIAGNOSIS — Z880 Allergy status to penicillin: Secondary | ICD-10-CM

## 2023-02-25 DIAGNOSIS — O10919 Unspecified pre-existing hypertension complicating pregnancy, unspecified trimester: Secondary | ICD-10-CM | POA: Diagnosis present

## 2023-02-25 DIAGNOSIS — K7689 Other specified diseases of liver: Secondary | ICD-10-CM | POA: Diagnosis present

## 2023-02-25 DIAGNOSIS — Z8249 Family history of ischemic heart disease and other diseases of the circulatory system: Secondary | ICD-10-CM | POA: Diagnosis not present

## 2023-02-25 DIAGNOSIS — E7879 Other disorders of bile acid and cholesterol metabolism: Secondary | ICD-10-CM | POA: Diagnosis present

## 2023-02-25 DIAGNOSIS — O9902 Anemia complicating childbirth: Secondary | ICD-10-CM | POA: Diagnosis present

## 2023-02-25 DIAGNOSIS — O26643 Intrahepatic cholestasis of pregnancy, third trimester: Principal | ICD-10-CM | POA: Diagnosis present

## 2023-02-25 DIAGNOSIS — Z3490 Encounter for supervision of normal pregnancy, unspecified, unspecified trimester: Principal | ICD-10-CM | POA: Diagnosis present

## 2023-02-25 LAB — COMPREHENSIVE METABOLIC PANEL
ALT: 11 U/L (ref 0–44)
AST: 23 U/L (ref 15–41)
Albumin: 2.7 g/dL — ABNORMAL LOW (ref 3.5–5.0)
Alkaline Phosphatase: 160 U/L — ABNORMAL HIGH (ref 38–126)
Anion gap: 11 (ref 5–15)
BUN: <5 mg/dL — ABNORMAL LOW (ref 6–20)
CO2: 19 mmol/L — ABNORMAL LOW (ref 22–32)
Calcium: 8.6 mg/dL — ABNORMAL LOW (ref 8.9–10.3)
Chloride: 109 mmol/L (ref 98–111)
Creatinine, Ser: 0.55 mg/dL (ref 0.44–1.00)
GFR, Estimated: >60 mL/min (ref >60)
Glucose, Bld: 70 mg/dL (ref 70–99)
Potassium: 3.2 mmol/L — ABNORMAL LOW (ref 3.5–5.1)
Sodium: 139 mmol/L (ref 135–145)
Total Bilirubin: 0.8 mg/dL (ref 0.0–1.2)
Total Protein: 5.9 g/dL — ABNORMAL LOW (ref 6.5–8.1)

## 2023-02-25 LAB — CBC
HCT: 30.1 % — ABNORMAL LOW (ref 36.0–46.0)
HCT: 28.4 % — ABNORMAL LOW (ref 36.0–46.0)
Hemoglobin: 10.2 g/dL — ABNORMAL LOW (ref 12.0–15.0)
Hemoglobin: 9.8 g/dL — ABNORMAL LOW (ref 12.0–15.0)
MCH: 28 pg (ref 26.0–34.0)
MCH: 28.5 pg (ref 26.0–34.0)
MCHC: 33.9 g/dL (ref 30.0–36.0)
MCHC: 34.5 g/dL (ref 30.0–36.0)
MCV: 82.7 fL (ref 80.0–100.0)
MCV: 82.6 fL (ref 80.0–100.0)
Platelets: 190 10*3/uL (ref 150–400)
Platelets: 167 10*3/uL (ref 150–400)
RBC: 3.64 MIL/uL — ABNORMAL LOW (ref 3.87–5.11)
RBC: 3.44 MIL/uL — ABNORMAL LOW (ref 3.87–5.11)
RDW: 13.8 % (ref 11.5–15.5)
RDW: 13.7 % (ref 11.5–15.5)
WBC: 11.4 10*3/uL — ABNORMAL HIGH (ref 4.0–10.5)
WBC: 10.7 10*3/uL — ABNORMAL HIGH (ref 4.0–10.5)
nRBC: 0 % (ref 0.0–0.2)
nRBC: 0 % (ref 0.0–0.2)

## 2023-02-25 LAB — PROTEIN / CREATININE RATIO, URINE
Creatinine, Urine: 142 mg/dL
Protein Creatinine Ratio: 0.22 mg/mg{creat} — ABNORMAL HIGH (ref 0.00–0.15)
Total Protein, Urine: 31 mg/dL

## 2023-02-25 LAB — RPR: RPR Ser Ql: NONREACTIVE

## 2023-02-25 LAB — TYPE AND SCREEN
ABO/RH(D): B POS
Antibody Screen: NEGATIVE

## 2023-02-25 LAB — URIC ACID: Uric Acid, Serum: 4.7 mg/dL (ref 2.5–7.1)

## 2023-02-25 LAB — LACTATE DEHYDROGENASE: LDH: 207 U/L — ABNORMAL HIGH (ref 98–192)

## 2023-02-25 MED ORDER — HYDROXYZINE HCL 50 MG PO TABS: 50.0000 mg | ORAL_TABLET | Freq: Four times a day (QID) | ORAL | Status: DC | PRN

## 2023-02-25 MED ORDER — MISOPROSTOL 25 MCG QUARTER TABLET
25.0000 ug | ORAL_TABLET | ORAL | Status: AC | PRN
  Administered 2023-02-25 (×3): 25 ug via VAGINAL
  Filled 2023-02-25 (×3): qty 1

## 2023-02-25 MED ORDER — OXYCODONE-ACETAMINOPHEN 5-325 MG PO TABS: 1.0000 | ORAL_TABLET | ORAL | Status: DC | PRN

## 2023-02-25 MED ORDER — LACTATED RINGERS IV SOLN: INTRAVENOUS | Status: AC

## 2023-02-25 MED ORDER — ALBUTEROL SULFATE (2.5 MG/3ML) 0.083% IN NEBU: 3.0000 mL | INHALATION_SOLUTION | Freq: Four times a day (QID) | RESPIRATORY_TRACT | Status: DC | PRN

## 2023-02-25 MED ORDER — FENTANYL-BUPIVACAINE-NACL 0.5-0.125-0.9 MG/250ML-% EP SOLN
12.0000 mL/h | EPIDURAL | Status: DC | PRN
  Administered 2023-02-25: 12 mL/h via EPIDURAL
  Filled 2023-02-25 (×2): qty 250

## 2023-02-25 MED ORDER — LIDOCAINE HCL (PF) 1 % IJ SOLN
30.0000 mL | INTRAMUSCULAR | Status: AC | PRN
  Administered 2023-02-26: 30 mL via SUBCUTANEOUS
  Filled 2023-02-25: qty 30

## 2023-02-25 MED ORDER — FENTANYL CITRATE (PF) 100 MCG/2ML IJ SOLN
50.0000 ug | INTRAMUSCULAR | Status: DC | PRN
  Administered 2023-02-25 – 2023-02-26 (×3): 100 ug via INTRAVENOUS
  Filled 2023-02-25 (×4): qty 2

## 2023-02-25 MED ORDER — ACETAMINOPHEN 325 MG PO TABS
650.0000 mg | ORAL_TABLET | ORAL | Status: DC | PRN
  Administered 2023-02-26: 650 mg via ORAL
  Filled 2023-02-25: qty 2

## 2023-02-25 MED ORDER — BUTALBITAL-APAP-CAFFEINE 50-325-40 MG PO TABS
1.0000 | ORAL_TABLET | Freq: Two times a day (BID) | ORAL | Status: DC | PRN
  Administered 2023-02-25 (×2): 1 via ORAL
  Filled 2023-02-25 (×2): qty 1

## 2023-02-25 MED ORDER — EPHEDRINE 5 MG/ML INJ: 10.0000 mg | INTRAVENOUS | Status: DC | PRN

## 2023-02-25 MED ORDER — TERBUTALINE SULFATE 1 MG/ML IJ SOLN
0.2500 mg | Freq: Once | INTRAMUSCULAR | Status: AC | PRN
  Administered 2023-02-26: 0.25 mg via SUBCUTANEOUS
  Filled 2023-02-25: qty 1

## 2023-02-25 MED ORDER — LACTATED RINGERS IV SOLN
500.0000 mL | Freq: Once | INTRAVENOUS | Status: AC
  Administered 2023-02-25: 500 mL via INTRAVENOUS

## 2023-02-25 MED ORDER — LACTATED RINGERS IV SOLN: 500.0000 mL | INTRAVENOUS | Status: AC | PRN

## 2023-02-25 MED ORDER — SOD CITRATE-CITRIC ACID 500-334 MG/5ML PO SOLN
30.0000 mL | ORAL | Status: DC | PRN
  Filled 2023-02-25: qty 30

## 2023-02-25 MED ORDER — OXYCODONE-ACETAMINOPHEN 5-325 MG PO TABS: 2.0000 | ORAL_TABLET | ORAL | Status: DC | PRN

## 2023-02-25 MED ORDER — OXYTOCIN-SODIUM CHLORIDE 30-0.9 UT/500ML-% IV SOLN: 2.5000 [IU]/h | INTRAVENOUS | Status: DC

## 2023-02-25 MED ORDER — PHENYLEPHRINE 80 MCG/ML (10ML) SYRINGE FOR IV PUSH (FOR BLOOD PRESSURE SUPPORT): 80.0000 ug | PREFILLED_SYRINGE | INTRAVENOUS | Status: DC | PRN

## 2023-02-25 MED ORDER — LIDOCAINE HCL (PF) 1 % IJ SOLN
INTRAMUSCULAR | Status: DC | PRN
  Administered 2023-02-25: 4 mL via EPIDURAL
  Administered 2023-02-25: 5 mL via EPIDURAL

## 2023-02-25 MED ORDER — OXYTOCIN BOLUS FROM INFUSION
333.0000 mL | Freq: Once | INTRAVENOUS | Status: AC
  Administered 2023-02-26: 333 mL via INTRAVENOUS

## 2023-02-25 MED ORDER — URSODIOL 300 MG PO CAPS
300.0000 mg | ORAL_CAPSULE | Freq: Two times a day (BID) | ORAL | Status: DC
  Administered 2023-02-25 – 2023-02-27 (×6): 300 mg via ORAL
  Filled 2023-02-25 (×8): qty 1

## 2023-02-25 MED ORDER — DIPHENHYDRAMINE HCL 50 MG/ML IJ SOLN
12.5000 mg | INTRAMUSCULAR | Status: AC | PRN
  Administered 2023-02-25 – 2023-02-26 (×3): 12.5 mg via INTRAVENOUS
  Filled 2023-02-25 (×2): qty 1

## 2023-02-25 MED ORDER — OXYTOCIN-SODIUM CHLORIDE 30-0.9 UT/500ML-% IV SOLN
1.0000 m[IU]/min | INTRAVENOUS | Status: DC
  Administered 2023-02-26 (×2): 1 m[IU]/min via INTRAVENOUS
  Filled 2023-02-25: qty 500

## 2023-02-25 MED ORDER — ONDANSETRON HCL 4 MG/2ML IJ SOLN
4.0000 mg | Freq: Four times a day (QID) | INTRAMUSCULAR | Status: DC | PRN
  Administered 2023-02-25: 4 mg via INTRAVENOUS
  Filled 2023-02-25: qty 2

## 2023-02-25 NOTE — Anesthesia Preprocedure Evaluation (Signed)
Anesthesia Evaluation  Patient identified by MRN, date of birth, ID band Patient awake    Reviewed: Allergy & Precautions, NPO status , Patient's Chart, lab work & pertinent test results  History of Anesthesia Complications Negative for: history of anesthetic complications  Airway Mallampati: II   Neck ROM: Full    Dental   Pulmonary asthma , former smoker   Pulmonary exam normal        Cardiovascular hypertension (gestational), Normal cardiovascular exam     Neuro/Psych  Headaches PSYCHIATRIC DISORDERS Anxiety        GI/Hepatic Neg liver ROS,,, Cholestasis    Endo/Other  negative endocrine ROS   Obesity K 3.2   Renal/GU negative Renal ROS     Musculoskeletal  (+) Arthritis ,  Fibromyalgia - Ankylosing spondylitis    Abdominal   Peds  Hematology  (+) Blood dyscrasia, anemia  Plt 167k    Anesthesia Other Findings   Reproductive/Obstetrics (+) Pregnancy  Hx Pre-E with prior pregnancy                              Anesthesia Physical Anesthesia Plan  ASA: 2  Anesthesia Plan: Epidural   Post-op Pain Management: Minimal or no pain anticipated   Induction:   PONV Risk Score and Plan: 2 and Treatment may vary due to age or medical condition  Airway Management Planned: Natural Airway  Additional Equipment: None  Intra-op Plan:   Post-operative Plan:   Informed Consent: I have reviewed the patients History and Physical, chart, labs and discussed the procedure including the risks, benefits and alternatives for the proposed anesthesia with the patient or authorized representative who has indicated his/her understanding and acceptance.       Plan Discussed with: Anesthesiologist  Anesthesia Plan Comments: (Labs reviewed. Platelets acceptable, patient not taking any blood thinning medications. Per RN, FHR tracing reported to be stable enough for sitting procedure. Risks  and benefits discussed with patient, including PDPH, backache, epidural hematoma, failed epidural, blood pressure changes, allergic reaction, and nerve injury. Patient expressed understanding and wished to proceed.)       Anesthesia Quick Evaluation

## 2023-02-25 NOTE — H&P (Deleted)
dd

## 2023-02-25 NOTE — H&P (Addendum)
Angelica Kim is a 33 y.o. female presenting for IOL d/t Cholestasis of pregnancy and gestational hypertension.  OB History     Gravida  4   Para  3   Term  1   Preterm  1   AB  0   Living  3      SAB  0   IAB  0   Ectopic  0   Multiple  0   Live Births  3          Past Medical History:  Diagnosis Date   Allergy to amoxicillin 02/07/2020   Ankylosing spondylitis (HCC)    Asthma    last used rescue inhaler "a while ago"   Diverticulitis 2014   Fibromyalgia    Gestational HTN, third trimester 12/07/2021   Gestational hypertension 08/03/2020   History of pre-eclampsia    History of premature delivery 02/07/2020   History of severe pre-eclampsia 07/30/2019   Hypertension in pregnancy, preeclampsia, severe, third trimester 12/08/2021   Kidney stones    Migraine    Ovarian cyst    Preterm labor    SVD (spontaneous vaginal delivery) 12/09/2021   Past Surgical History:  Procedure Laterality Date   COLONOSCOPY     ESOPHAGOGASTRODUODENOSCOPY ENDOSCOPY     spinal tap     Family History: family history includes Arthritis in her mother; Deep vein thrombosis in her mother; Heart disease in her maternal grandfather; Hypertension in her maternal grandmother; Kidney Stones in her maternal grandmother. Social History:  reports that she quit smoking about 11 years ago. Her smoking use included cigarettes. She has never used smokeless tobacco. She reports that she does not currently use alcohol. She reports that she does not use drugs.     Maternal Diabetes: No Genetic Screening: Declined Maternal Ultrasounds/Referrals: Normal  Fetal Ultrasounds or other Referrals:  None Maternal Substance Abuse:  No Significant Maternal Medications:  albuterol, odansetron, percocet, fioricet, ursodiol, promethazine Significant Maternal Lab Results:  Group B Strep negative Number of Prenatal Visits:greater than 3 verified prenatal visits Maternal Vaccinations:None  documented Other Comments:  marginal insertion of cord noted on ultrasound  Review of Systems No F/C/N/V/D  History Dilation: FT Effacement (%): Thick Station: -3 Exam by:: J.Cox, RN Blood pressure (!) 153/92, pulse 80, temperature 98 F (36.7 C), temperature source Oral, height 5' (1.524 m), weight 83.6 kg, last menstrual period 06/11/2022, unknown if currently breastfeeding. Exam Physical Exam  Lungs unlabored breathing CV RRR Abdomen gravid, NT Extremities no calf tenderness  FHT 150, + accels, no decels, mod variability Toco irregular  Prenatal labs: ABO, Rh: --/--/B POS (01/27 0810) Antibody: NEG (01/27 0810) Rubella:  Immune (04/2021) RPR: NON REACTIVE (01/27 0811)  HBsAg: Negative (08/26 0000)  HIV: Non-reactive (08/26 0000)  GBS: Negative/-- (01/16 0000)   Total bile acids 01/25/23 1.9  Bedside ultrasound - cephalic presentation EFW 02-22-23 with MFM 7lbs 4oz 82%, cephalic, nl fluid  Assessment/Plan: 32 [id number] at 37wks admitted for IOL d/t cholestasis of pregnancy and gestational hypertension.  Plan cytotec for cervical ripening then low dose pitocin as indicated.  Pain medicine upon request.  GBS negative.  Fetal status overall reassuring with cat 1 tracing.   Purcell Nails 02/25/2023, 6:06 PM

## 2023-02-25 NOTE — Progress Notes (Addendum)
Mildly elevated BPs PIH labs wnl, PCR 0.22 VE per Liliana Cline, RN FHT reviewed remotely by me 150, + accels, no decels, mod variability Cat 1 tracing Toco q70min 3rd cytotec placed at 1800

## 2023-02-25 NOTE — Progress Notes (Signed)
Angelica Kim is a 33 y.o. [phone] at [redacted]w[redacted]d  Subjective: S/p Epidural just now.  Pt doing well and comfortable.  Objective: BP (!) 143/89   Pulse 73   Temp 97.9 F (36.6 C) (Oral)   Resp 18   Ht 5' (1.524 m)   Wt 83.6 kg   LMP 06/11/2022 (Exact Date)   SpO2 100%   BMI 36.01 kg/m  No intake/output data recorded. No intake/output data recorded.  FHT:  FHR: 140 bpm, variability: moderate,  accelerations:  Present,  decelerations:  Absent UC:   regular, every 1-2 minutes SVE:   Dilation: 4 Effacement (%): 80 Station: -3 Exam by:: Lance Morin, MD  Labs: Lab Results  Component Value Date   WBC 10.7 (H) 02/25/2023   HGB 9.8 (L) 02/25/2023   HCT 28.4 (L) 02/25/2023   MCV 82.6 02/25/2023   PLT 167 02/25/2023    Assessment / Plan: Induction of labor due to cholestasis of pregnancy and gestational hypertension.  Labor:  Progressing well s/p cytotec x 3 now with an epidural. Preeclampsia:  no signs or symptoms of toxicity.  BPs in mild range. Fetal Wellbeing:  Category I Pain Control:  Epidural I/D:   GBS neg Anticipated MOD:  NSVD  Purcell Nails, MD 02/25/2023, 10:56 PM

## 2023-02-25 NOTE — Anesthesia Procedure Notes (Signed)
Epidural Patient location during procedure: OB Start time: 02/25/2023 10:26 PM End time: 02/25/2023 10:32 PM  Staffing Anesthesiologist: Beryle Lathe, MD Performed: anesthesiologist   Preanesthetic Checklist Completed: patient identified, IV checked, risks and benefits discussed, monitors and equipment checked, pre-op evaluation and timeout performed  Epidural Patient position: sitting Prep: DuraPrep Patient monitoring: continuous pulse ox and blood pressure Approach: right paramedian Location: L2-L3 Injection technique: LOR saline  Needle:  Needle type: Tuohy  Needle gauge: 17 G Needle length: 9 cm Needle insertion depth: 8 cm Catheter size: 19 Gauge Catheter at skin depth: 13 cm Test dose: negative and Other (1% lidocaine)  Assessment Events: blood not aspirated and no cerebrospinal fluid  Additional Notes Patient identified. Risks including, but not limited to, bleeding, infection, nerve damage, paralysis, inadequate analgesia, blood pressure changes, nausea, vomiting, allergic reaction, postpartum back pain, itching, and headache were discussed. Patient expressed understanding and wished to proceed. Sterile prep and drape, including hand hygiene, mask, and sterile gloves were used. The patient was positioned and the spine was prepped. The skin was anesthetized with lidocaine. No paraesthesia or other complication noted. The patient did not experience any signs of intravascular injection such as tinnitus or metallic taste in mouth, nor signs of intrathecal spread such as rapid motor block. Please see nursing notes for vital signs. The patient tolerated the procedure well.   Leslye Peer, MDReason for block:procedure for pain

## 2023-02-26 ENCOUNTER — Encounter (HOSPITAL_COMMUNITY): Payer: Self-pay | Admitting: Obstetrics and Gynecology

## 2023-02-26 LAB — COMPREHENSIVE METABOLIC PANEL
ALT: 11 U/L (ref 0–44)
AST: 14 U/L — ABNORMAL LOW (ref 15–41)
Albumin: 2.6 g/dL — ABNORMAL LOW (ref 3.5–5.0)
Alkaline Phosphatase: 136 U/L — ABNORMAL HIGH (ref 38–126)
Anion gap: 11 (ref 5–15)
BUN: <5 mg/dL — ABNORMAL LOW (ref 6–20)
CO2: 18 mmol/L — ABNORMAL LOW (ref 22–32)
Calcium: 8.3 mg/dL — ABNORMAL LOW (ref 8.9–10.3)
Chloride: 107 mmol/L (ref 98–111)
Creatinine, Ser: 0.56 mg/dL (ref 0.44–1.00)
GFR, Estimated: >60 mL/min (ref >60)
Glucose, Bld: 73 mg/dL (ref 70–99)
Potassium: 2.9 mmol/L — ABNORMAL LOW (ref 3.5–5.1)
Sodium: 136 mmol/L (ref 135–145)
Total Bilirubin: 0.7 mg/dL (ref 0.0–1.2)
Total Protein: 5.6 g/dL — ABNORMAL LOW (ref 6.5–8.1)

## 2023-02-26 LAB — CBC
HCT: 28.3 % — ABNORMAL LOW (ref 36.0–46.0)
HCT: 27.8 % — ABNORMAL LOW (ref 36.0–46.0)
Hemoglobin: 9.5 g/dL — ABNORMAL LOW (ref 12.0–15.0)
Hemoglobin: 9.5 g/dL — ABNORMAL LOW (ref 12.0–15.0)
MCH: 27.9 pg (ref 26.0–34.0)
MCH: 28.5 pg (ref 26.0–34.0)
MCHC: 33.6 g/dL (ref 30.0–36.0)
MCHC: 34.2 g/dL (ref 30.0–36.0)
MCV: 83.2 fL (ref 80.0–100.0)
MCV: 83.5 fL (ref 80.0–100.0)
Platelets: 169 10*3/uL (ref 150–400)
Platelets: 152 10*3/uL (ref 150–400)
RBC: 3.4 MIL/uL — ABNORMAL LOW (ref 3.87–5.11)
RBC: 3.33 MIL/uL — ABNORMAL LOW (ref 3.87–5.11)
RDW: 13.8 % (ref 11.5–15.5)
RDW: 13.8 % (ref 11.5–15.5)
WBC: 11.6 10*3/uL — ABNORMAL HIGH (ref 4.0–10.5)
WBC: 15.1 10*3/uL — ABNORMAL HIGH (ref 4.0–10.5)
nRBC: 0 % (ref 0.0–0.2)
nRBC: 0 % (ref 0.0–0.2)

## 2023-02-26 MED ORDER — SENNOSIDES-DOCUSATE SODIUM 8.6-50 MG PO TABS
2.0000 | ORAL_TABLET | Freq: Every day | ORAL | Status: DC
  Administered 2023-02-27: 2 via ORAL
  Filled 2023-02-26 (×2): qty 2

## 2023-02-26 MED ORDER — TRANEXAMIC ACID-NACL 1000-0.7 MG/100ML-% IV SOLN
INTRAVENOUS | Status: AC
  Filled 2023-02-26: qty 100

## 2023-02-26 MED ORDER — TETANUS-DIPHTH-ACELL PERTUSSIS 5-2.5-18.5 LF-MCG/0.5 IM SUSY
0.5000 mL | PREFILLED_SYRINGE | Freq: Once | INTRAMUSCULAR | Status: AC
  Administered 2023-02-27: 0.5 mL via INTRAMUSCULAR
  Filled 2023-02-26: qty 0.5

## 2023-02-26 MED ORDER — ACETAMINOPHEN 325 MG PO TABS
650.0000 mg | ORAL_TABLET | ORAL | Status: DC | PRN
  Administered 2023-02-26 – 2023-03-01 (×2): 650 mg via ORAL
  Filled 2023-02-26 (×2): qty 2

## 2023-02-26 MED ORDER — MAGNESIUM SULFATE BOLUS VIA INFUSION
4.0000 g | Freq: Once | INTRAVENOUS | Status: AC
  Administered 2023-02-26: 4 g via INTRAVENOUS
  Filled 2023-02-26: qty 1000

## 2023-02-26 MED ORDER — BENZOCAINE-MENTHOL 20-0.5 % EX AERO: 1.0000 | INHALATION_SPRAY | CUTANEOUS | Status: DC | PRN

## 2023-02-26 MED ORDER — LACTATED RINGERS AMNIOINFUSION: INTRAVENOUS | Status: DC

## 2023-02-26 MED ORDER — ONDANSETRON HCL 4 MG/2ML IJ SOLN: 4.0000 mg | INTRAMUSCULAR | Status: DC | PRN

## 2023-02-26 MED ORDER — ONDANSETRON HCL 4 MG PO TABS: 4.0000 mg | ORAL_TABLET | ORAL | Status: DC | PRN

## 2023-02-26 MED ORDER — SIMETHICONE 80 MG PO CHEW: 80.0000 mg | CHEWABLE_TABLET | ORAL | Status: DC | PRN

## 2023-02-26 MED ORDER — TRANEXAMIC ACID-NACL 1000-0.7 MG/100ML-% IV SOLN
1000.0000 mg | INTRAVENOUS | Status: AC
  Administered 2023-02-26: 1000 mg via INTRAVENOUS

## 2023-02-26 MED ORDER — DIPHENHYDRAMINE HCL 25 MG PO CAPS: 25.0000 mg | ORAL_CAPSULE | Freq: Four times a day (QID) | ORAL | Status: DC | PRN

## 2023-02-26 MED ORDER — NALBUPHINE HCL 10 MG/ML IJ SOLN
5.0000 mg | Freq: Once | INTRAMUSCULAR | Status: AC
  Administered 2023-02-26: 5 mg via INTRAVENOUS
  Filled 2023-02-26: qty 1

## 2023-02-26 MED ORDER — PRENATAL MULTIVITAMIN CH
1.0000 | ORAL_TABLET | Freq: Every day | ORAL | Status: DC
  Administered 2023-02-27 – 2023-02-28 (×2): 1 via ORAL
  Filled 2023-02-26 (×2): qty 1

## 2023-02-26 MED ORDER — COCONUT OIL OIL: 1.0000 | TOPICAL_OIL | Status: DC | PRN

## 2023-02-26 MED ORDER — IBUPROFEN 600 MG PO TABS
600.0000 mg | ORAL_TABLET | Freq: Four times a day (QID) | ORAL | Status: DC
  Administered 2023-02-26 – 2023-03-01 (×10): 600 mg via ORAL
  Filled 2023-02-26 (×11): qty 1

## 2023-02-26 MED ORDER — POTASSIUM CHLORIDE 10 MEQ/100ML IV SOLN
10.0000 meq | INTRAVENOUS | Status: AC
Start: 2023-02-26 — End: 2023-02-26
  Administered 2023-02-26 (×3): 10 meq via INTRAVENOUS
  Filled 2023-02-26 (×3): qty 100

## 2023-02-26 MED ORDER — MAGNESIUM SULFATE 40 GM/1000ML IV SOLN
2.0000 g/h | INTRAVENOUS | Status: DC
  Administered 2023-02-27: 2 g/h via INTRAVENOUS
  Filled 2023-02-26 (×2): qty 1000

## 2023-02-26 MED ORDER — DIBUCAINE (PERIANAL) 1 % EX OINT: 1.0000 | TOPICAL_OINTMENT | CUTANEOUS | Status: DC | PRN

## 2023-02-26 MED ORDER — ZOLPIDEM TARTRATE 5 MG PO TABS
5.0000 mg | ORAL_TABLET | Freq: Every evening | ORAL | Status: DC | PRN
Start: 2023-02-26 — End: 2023-03-01

## 2023-02-26 MED ORDER — LACTATED RINGERS IV SOLN: INTRAVENOUS | Status: AC

## 2023-02-26 MED ORDER — WITCH HAZEL-GLYCERIN EX PADS: 1.0000 | MEDICATED_PAD | CUTANEOUS | Status: DC | PRN

## 2023-02-26 NOTE — Progress Notes (Signed)
Angelica Kim is a 33 y.o. [phone] at [redacted]w[redacted]d   Subjective: Reports a HA again s/p fioricet at around 0100.    Objective: BP 127/83 (BP Location: Right Arm)   Pulse 70   Temp 98.5 F (36.9 C) (Oral)   Resp 17   Ht 5' (1.524 m)   Wt 83.6 kg   LMP 06/11/2022 (Exact Date)   SpO2 100%   BMI 36.01 kg/m  No intake/output data recorded. No intake/output data recorded.  FHT:  FHR: 130 bpm, variability: moderate,  accelerations:  Present,  decelerations:  early decelerations UC:   regular, every 2-4 minutes SVE:   Dilation: 7 Effacement (%): 90 Station: -3 Exam by:: Su Hilt, MD AROM at 0440  Labs: Lab Results  Component Value Date   WBC 11.6 (H) 02/26/2023   HGB 9.5 (L) 02/26/2023   HCT 28.3 (L) 02/26/2023   MCV 83.2 02/26/2023   PLT 169 02/26/2023    Assessment / Plan: 32yo undergoing IOL for cholestasis of pregnancy and GHTN.  Labor:  Pt initially seemed to be in her own labor s/p cytotec x3 but is unchanged and after placement of IUPC, MVUs are inadequate.  Will start low dose pitocin and titrate as indicated. Preeclampsia:   c/o HA with mildly elevated BP, labs have been wnl.  HA relieved with fioricet and tylenol given now.  Labs  stable at 0500 but potassium is a little low.  Will replete. Fetal Wellbeing:   Overall reassuring with cat 1 tracing and early decelerations noted. Pain Control:  Epidural I/D:   GBS neg Anticipated MOD:   Unsure  Purcell Nails, MD 02/26/2023, 6:29 AM

## 2023-02-26 NOTE — Progress Notes (Signed)
Pt is comfortable with epidural BP 136/84   Pulse 85   Temp 98.2 F (36.8 C) (Oral)   Resp 16   Ht 5' (1.524 m)   Wt 83.6 kg   LMP 06/11/2022 (Exact Date)   SpO2 97%   BMI 36.01 kg/m  Cat 1 Toco q2 min Amnioinfusion with good return 9/80/-1 Offered CS vs start pitocin R&B discussed She wanted to start pitocin Infant is doing well Will try to make contractions adequate.

## 2023-02-26 NOTE — Progress Notes (Addendum)
Called to pt's room by RN d/t patient having variables. Pt examined 7-8/90/-2 MVU 180 Will start amnioinfusion Fluid bolus being given  FSE placed Position changes, currently on hands and knees Pt reports HA is gone Terb given as well Will cont to monitor closely

## 2023-02-26 NOTE — Lactation Note (Signed)
This note was copied from a baby's chart. Lactation Consultation Note  Patient Name: Boy Naina Sleeper [id number] Date: 02/26/2023 Age:33 hours Reason for consult: Initial assessment  P4- MOB requested to not see LC team. Please let LC team know if this changes.  Feeding Mother's Current Feeding Choice: Breast Milk  Consult Status Consult Status: Complete (mother declined follow up) Date: 02/26/23    Dema Severin BS, IBCLC 02/26/2023, 4:02 PM

## 2023-02-26 NOTE — Progress Notes (Signed)
Tracing has recovered and now cat 1 but head is still high and behind the pelvic bone.  When I discussed c-section with patient she started to cry and really wants a vaginal delivery.  Will review tracing with Dr. Normand Sloop, on coming MD and discuss POC. Exam is 7-8cm/90/-2.

## 2023-02-27 LAB — COMPREHENSIVE METABOLIC PANEL
ALT: 13 U/L (ref 0–44)
AST: 24 U/L (ref 15–41)
Albumin: 2.3 g/dL — ABNORMAL LOW (ref 3.5–5.0)
Alkaline Phosphatase: 134 U/L — ABNORMAL HIGH (ref 38–126)
Anion gap: 11 (ref 5–15)
BUN: <5 mg/dL — ABNORMAL LOW (ref 6–20)
CO2: 20 mmol/L — ABNORMAL LOW (ref 22–32)
Calcium: 7 mg/dL — ABNORMAL LOW (ref 8.9–10.3)
Chloride: 105 mmol/L (ref 98–111)
Creatinine, Ser: 0.78 mg/dL (ref 0.44–1.00)
GFR, Estimated: >60 mL/min (ref >60)
Glucose, Bld: 137 mg/dL — ABNORMAL HIGH (ref 70–99)
Potassium: 2.8 mmol/L — ABNORMAL LOW (ref 3.5–5.1)
Sodium: 136 mmol/L (ref 135–145)
Total Bilirubin: 0.5 mg/dL (ref 0.0–1.2)
Total Protein: 5 g/dL — ABNORMAL LOW (ref 6.5–8.1)

## 2023-02-27 LAB — CBC
HCT: 28 % — ABNORMAL LOW (ref 36.0–46.0)
Hemoglobin: 9.3 g/dL — ABNORMAL LOW (ref 12.0–15.0)
MCH: 28 pg (ref 26.0–34.0)
MCHC: 33.2 g/dL (ref 30.0–36.0)
MCV: 84.3 fL (ref 80.0–100.0)
Platelets: 186 10*3/uL (ref 150–400)
RBC: 3.32 MIL/uL — ABNORMAL LOW (ref 3.87–5.11)
RDW: 13.9 % (ref 11.5–15.5)
WBC: 12.7 10*3/uL — ABNORMAL HIGH (ref 4.0–10.5)
nRBC: 0 % (ref 0.0–0.2)

## 2023-02-27 LAB — MAGNESIUM: Magnesium: 4.5 mg/dL — ABNORMAL HIGH (ref 1.7–2.4)

## 2023-02-27 MED ORDER — POLYSACCHARIDE IRON COMPLEX 150 MG PO CAPS
150.0000 mg | ORAL_CAPSULE | ORAL | Status: DC
  Administered 2023-02-28: 150 mg via ORAL
  Filled 2023-02-27: qty 1

## 2023-02-27 MED ORDER — OXYCODONE HCL 5 MG PO TABS
5.0000 mg | ORAL_TABLET | ORAL | Status: DC | PRN
  Administered 2023-02-27: 5 mg via ORAL
  Filled 2023-02-27: qty 1

## 2023-02-27 MED ORDER — ENALAPRIL MALEATE 2.5 MG PO TABS
5.0000 mg | ORAL_TABLET | Freq: Every day | ORAL | Status: DC
  Administered 2023-02-27 – 2023-02-28 (×2): 5 mg via ORAL
  Filled 2023-02-27 (×2): qty 2

## 2023-02-27 MED ORDER — POTASSIUM CHLORIDE CRYS ER 20 MEQ PO TBCR
30.0000 meq | EXTENDED_RELEASE_TABLET | Freq: Two times a day (BID) | ORAL | Status: DC
  Administered 2023-02-27 – 2023-03-01 (×5): 30 meq via ORAL
  Filled 2023-02-27 (×5): qty 2

## 2023-02-27 NOTE — Plan of Care (Signed)

## 2023-02-27 NOTE — Social Work (Signed)
MOB was referred for history of anxiety.  * Referral screened out by Clinical Social Worker because none of the following criteria appear to apply: ~ History of anxiety/depression during this pregnancy, or of post-partum depression following prior delivery. ~ Diagnosis of anxiety and/or depression within last 3 years. Per chart review, MOB diagnosed with GAD 2016. There were noted mental health concerns in prenatal record.  OR * MOB's symptoms currently being treated with medication and/or therapy.  Please contact the Clinical Social Worker if needs arise, by [facility] request, or if MOB scores greater than 9/yes to question 10 on Edinburgh Postpartum Depression Screen. MOB completed [place] with score of 9.   Vivi Barrack, MSW, LCSW Clinical Social Worker  [phone] 02/27/2023  9:15 AM

## 2023-02-27 NOTE — Anesthesia Postprocedure Evaluation (Signed)
Anesthesia Post Note  Patient: Angelica Kim  Procedure(s) Performed: AN AD HOC LABOR EPIDURAL     Patient location during evaluation: Mother Baby Anesthesia Type: Epidural Level of consciousness: awake, oriented and awake and alert Pain management: pain level controlled Vital Signs Assessment: post-procedure vital signs reviewed and stable Respiratory status: spontaneous breathing, nonlabored ventilation and respiratory function stable Cardiovascular status: stable Postop Assessment: no headache, adequate PO intake, able to ambulate, patient able to bend at knees and no apparent nausea or vomiting Anesthetic complications: no   No notable events documented.  Last Vitals:  Vitals:   02/27/23 0600 02/27/23 0656  BP:    Pulse:    Resp: 18 18  Temp:    SpO2:      Last Pain:  Vitals:   02/27/23 0600  TempSrc:   PainSc: 5    Pain Goal:                   Gerrad Welker

## 2023-02-27 NOTE — Progress Notes (Addendum)
Post Partum Day 1 Subjective: Patient was sen at about 10 am earlier in the day.  up ad lib, voiding, and tolerating PO. She reports some left sided abdominal cramping, moves into left groin that started after delivery.   Objective: Blood pressure (!) 148/100, pulse 84, temperature 98.1 F (36.7 C), temperature source Oral, resp. rate 20, height 5' (1.524 m), weight 81.1 kg, last menstrual period 06/11/2022, SpO2 100%, unknown if currently breastfeeding.    02/27/2023    5:04 PM 02/27/2023   12:15 PM 02/27/2023    8:00 AM  Vitals with BMI  Systolic 148 133 119  Diastolic 100 83 86  Pulse 84 87 83  I/O last 3 completed shifts: In: 4035.2 [P.O.:2260; I.V.:1530.6; Other:244.6] Out: 7411 [Urine:7200; Blood:211]    Physical Exam:  General: alert, cooperative, and no distress Lochia: appropriate Uterine Fundus: firm Incision: N/A DVT Evaluation: No evidence of DVT seen on physical exam. Calf/Ankle edema is present.  Recent Labs    02/26/23 1441 02/27/23 0604  HGB 9.5* 9.3*  HCT 27.8* 28.0*   Assessment/Plan:  33 y/o G4P4 PPD # 1, on postpartum magnesium sulfate for preeclampsia with severe features, - Pain medication as needed.  - Continue 24 hrs of postpartum magnesium sulfate, will start oral antihypertensives for persistently elevated blood pressures.  - Oral potassium for low potassium. - Iron tabs for anemia.    LOS: 2 days   Angelica Sours, MD 02/27/2023, 5:59 PM

## 2023-02-28 LAB — COMPREHENSIVE METABOLIC PANEL
ALT: 13 U/L (ref 0–44)
AST: 18 U/L (ref 15–41)
Albumin: 2.3 g/dL — ABNORMAL LOW (ref 3.5–5.0)
Alkaline Phosphatase: 131 U/L — ABNORMAL HIGH (ref 38–126)
Anion gap: 4 — ABNORMAL LOW (ref 5–15)
BUN: <5 mg/dL — ABNORMAL LOW (ref 6–20)
CO2: 23 mmol/L (ref 22–32)
Calcium: 7.9 mg/dL — ABNORMAL LOW (ref 8.9–10.3)
Chloride: 110 mmol/L (ref 98–111)
Creatinine, Ser: 0.66 mg/dL (ref 0.44–1.00)
GFR, Estimated: >60 mL/min (ref >60)
Glucose, Bld: 98 mg/dL (ref 70–99)
Potassium: 3.3 mmol/L — ABNORMAL LOW (ref 3.5–5.1)
Sodium: 137 mmol/L (ref 135–145)
Total Bilirubin: 0.3 mg/dL (ref 0.0–1.2)
Total Protein: 5 g/dL — ABNORMAL LOW (ref 6.5–8.1)

## 2023-02-28 LAB — SURGICAL PATHOLOGY

## 2023-02-28 MED ORDER — LABETALOL HCL 200 MG PO TABS: 200.0000 mg | ORAL_TABLET | Freq: Two times a day (BID) | ORAL | Status: DC

## 2023-02-28 MED ORDER — ENALAPRIL MALEATE 2.5 MG PO TABS
5.0000 mg | ORAL_TABLET | Freq: Once | ORAL | Status: AC
  Administered 2023-02-28: 5 mg via ORAL
  Filled 2023-02-28: qty 2

## 2023-02-28 MED ORDER — ENALAPRIL MALEATE 2.5 MG PO TABS
10.0000 mg | ORAL_TABLET | Freq: Every day | ORAL | Status: DC
  Administered 2023-03-01: 10 mg via ORAL
  Filled 2023-02-28: qty 4

## 2023-02-28 NOTE — Progress Notes (Signed)
Post Partum Day 2 VAVD Subjective: no complaints, up ad lib, voiding, tolerating PO, + flatus, and +BM and denies HA, visual changes or abdominal pain.  Pt is tearful because baby needs to stay another day and she will stay another day as well to get BP under better control.  Objective: Blood pressure (!) 149/78, pulse 72, temperature 97.9 F (36.6 C), temperature source Oral, resp. rate 18, height 5' (1.524 m), weight 80.2 kg, last menstrual period 06/11/2022, SpO2 99%, unknown if currently breastfeeding.  Physical Exam:  General: alert, cooperative, and no distress Lochia: appropriate Uterine Fundus: FF, NT Incision: n/a DVT Evaluation: No evidence of DVT seen on physical exam.  Recent Labs    02/26/23 1441 02/27/23 0604  HGB 9.5* 9.3*  HCT 27.8* 28.0*    Assessment/Plan: Plan for discharge tomorrow and Breastfeeding and bottle feeding breast milk. Pt wants to use enalapril for BP control because she used that PP for her last pregnancy Enalapril increased to 10mg  daily d/t BPs still elevated mildly although pt is asymptomatic Cont routine PP care   LOS: 3 days   Purcell Nails, MD 02/28/2023, 11:55 AM

## 2023-03-01 ENCOUNTER — Encounter (HOSPITAL_COMMUNITY): Payer: Self-pay | Admitting: Obstetrics and Gynecology

## 2023-03-01 DIAGNOSIS — O10919 Unspecified pre-existing hypertension complicating pregnancy, unspecified trimester: Secondary | ICD-10-CM | POA: Diagnosis present

## 2023-03-01 DIAGNOSIS — O26643 Intrahepatic cholestasis of pregnancy, third trimester: Secondary | ICD-10-CM | POA: Diagnosis present

## 2023-03-01 MED ORDER — ACETAMINOPHEN 325 MG PO TABS: 650.0000 mg | ORAL_TABLET | ORAL | Status: DC | PRN

## 2023-03-01 MED ORDER — IBUPROFEN 600 MG PO TABS: 600.0000 mg | ORAL_TABLET | Freq: Four times a day (QID) | ORAL | 0 refills | Status: DC

## 2023-03-01 MED ORDER — ENALAPRIL MALEATE 10 MG PO TABS: 10.0000 mg | ORAL_TABLET | Freq: Every day | ORAL | 1 refills | Status: AC

## 2023-03-01 NOTE — Discharge Summary (Signed)
Postpartum Discharge Summary  Date of Service updated 03/01/23     Patient Name: Angelica Kim DOB: [birth year] MRN: [id number]  Date of admission: 02/25/2023 Delivery date:02/26/2023 Delivering provider: Jaymes Graff Date of discharge: 03/01/2023  Admitting diagnosis: Encounter for induction of labor [Z34.90] Intrauterine pregnancy: [redacted]w[redacted]d     Secondary diagnosis:  Principal Problem:   Encounter for induction of labor Active Problems:   Chronic hypertension affecting pregnancy   Vacuum-assisted vaginal delivery  Additional problems: none    Discharge diagnosis: Term Pregnancy Delivered and CHTN                                              Post partum procedures: none Augmentation: Cytotec Complications: shoulder dystocia  Hospital course: Induction of Labor With Vaginal Delivery   33 y.o. yo [phone] at [redacted]w[redacted]d was admitted to the hospital 02/25/2023 for induction of labor.  Indication for induction:  cholestasis of pregnancy .  Patient had an uncomplicated labor course during the first stage. The second stage was complicated by non-reassuring fetal heart surveillance Membrane Rupture Time/Date: 4:37 AM,02/26/2023  Delivery Method:Vaginal, Spontaneous Operative Delivery:Device used:Kiwi vacuum Indication: Fetal indications Episiotomy: None Lacerations:  1st degree;Perineal Details of delivery can be found in separate delivery note.  Patient had a postpartum course complicated by elevated blood pressures requiring antihypertensives. Patient is discharged home 03/01/23 in good condition. Educated on S/S of pre-eclampsia.   Newborn Data: Birth date:02/26/2023 Birth time:1:20 PM Gender:Female Living status:Living Apgars:4 ,8  Weight:3440 g  Magnesium Sulfate received: No BMZ received: No Rhophylac:N/A MMR:No T-DaP: declined Flu: No RSV Vaccine received: No Transfusion:No Immunizations administered: Immunization History  Administered Date(s) Administered   Tdap  03/23/2019, 02/27/2023    Physical exam  Vitals:   02/28/23 2353 03/01/23 0457 03/01/23 0751 03/01/23 1148  BP: (!) 144/77 (!) 150/84 (!) 146/92 136/87  Pulse: 68 69 64 63  Resp: 16 16 18 18   Temp: 97.8 F (36.6 C) 97.6 F (36.4 C) 98.1 F (36.7 C) 97.9 F (36.6 C)  TempSrc: Oral Oral Oral Oral  SpO2: 100% 99% 99% 99%  Weight:  80 kg    Height:       General: alert, cooperative, and no distress Lochia: appropriate Uterine Fundus: soft Incision: N/A DVT Evaluation: No evidence of DVT seen on physical exam. No cords or calf tenderness. No significant calf/ankle edema. Labs: Lab Results  Component Value Date   WBC 12.7 (H) 02/27/2023   HGB 9.3 (L) 02/27/2023   HCT 28.0 (L) 02/27/2023   MCV 84.3 02/27/2023   PLT 186 02/27/2023      Latest Ref Rng & Units 02/28/2023    4:11 AM  CMP  Glucose 70 - 99 mg/dL 98   BUN 6 - 20 mg/dL <5   Creatinine 1.47 - 1.00 mg/dL 8.29   Sodium 562 - 130 mmol/L 137   Potassium 3.5 - 5.1 mmol/L 3.3   Chloride 98 - 111 mmol/L 110   CO2 22 - 32 mmol/L 23   Calcium 8.9 - 10.3 mg/dL 7.9   Total Protein 6.5 - 8.1 g/dL 5.0   Total Bilirubin 0.0 - 1.2 mg/dL 0.3   Alkaline Phos 38 - 126 U/L 131   AST 15 - 41 U/L 18   ALT 0 - 44 U/L 13    Edinburgh Score:    02/27/2023  4:47 AM  Inocente Salles Postnatal Depression Scale Screening Tool  I have been able to laugh and see the funny side of things. 0  I have looked forward with enjoyment to things. 0  I have blamed myself unnecessarily when things went wrong. 2  I have been anxious or worried for no good reason. 2  I have felt scared or panicky for no good reason. 2  Things have been getting on top of me. 1  I have been so unhappy that I have had difficulty sleeping. 0  I have felt sad or miserable. 1  I have been so unhappy that I have been crying. 1  The thought of harming myself has occurred to me. 0  Edinburgh Postnatal Depression Scale Total 9      After visit meds:  Allergies as of  03/01/2023       Reactions   Amoxicillin Hives, Shortness Of Breath, Swelling   Swelling of hands, face, and lip Has patient had a PCN reaction causing immediate rash, facial/tongue/throat swelling, SOB or lightheadedness with hypotension: Unknown Has patient had a PCN reaction causing severe rash involving mucus membranes or skin necrosis: Yes Has patient had a PCN reaction that required hospitalization: No Has patient had a PCN reaction occurring within the last 10 years: No If all of the above answers are "NO", then may proceed with Cephalosporin use.        Medication List     STOP taking these medications    aspirin 81 MG chewable tablet   butalbital-acetaminophen-caffeine 50-325-40 MG tablet Commonly known as: FIORICET   promethazine 25 MG tablet Commonly known as: PHENERGAN       TAKE these medications    acetaminophen 325 MG tablet Commonly known as: Tylenol Take 2 tablets (650 mg total) by mouth every 4 (four) hours as needed (for pain scale < 4).   albuterol (2.5 MG/3ML) 0.083% nebulizer solution Commonly known as: PROVENTIL Take 3 mLs (2.5 mg total) by nebulization every 6 (six) hours as needed for wheezing or shortness of breath.   albuterol 108 (90 Base) MCG/ACT inhaler Commonly known as: VENTOLIN HFA Inhale 2 puffs into the lungs every 6 (six) hours as needed for wheezing or shortness of breath.   cetirizine 10 MG tablet Commonly known as: ZYRTEC Take 10 mg by mouth daily.   diphenhydrAMINE 25 MG tablet Commonly known as: BENADRYL Take 1 tablet (25 mg total) by mouth every 6 (six) hours as needed.   enalapril 10 MG tablet Commonly known as: VASOTEC Take 1 tablet (10 mg total) by mouth daily. Start taking on: March 02, 2023   Excedrin Tension Headache 500-65 MG Tabs per tablet Generic drug: acetaminophen-caffeine Take 2 tablets by mouth every 6 (six) hours as needed (Headache).   ferrous sulfate 325 (65 FE) MG tablet Take 1 tablet (325 mg  total) by mouth every other day.   fluticasone 50 MCG/ACT nasal spray Commonly known as: FLONASE Place 1 spray into both nostrils daily.   ibuprofen 600 MG tablet Commonly known as: ADVIL Take 1 tablet (600 mg total) by mouth every 6 (six) hours.   ondansetron 4 MG disintegrating tablet Commonly known as: ZOFRAN-ODT Take 1 tablet (4 mg total) by mouth every 8 (eight) hours as needed for nausea or vomiting.   PRENATAL GUMMIES PO Take by mouth.   ursodiol 300 MG capsule Commonly known as: ACTIGALL Take 300 mg by mouth 2 (two) times daily.         Discharge  home in stable condition Infant Feeding: Breast Infant Disposition:home with mother Discharge instruction: per After Visit Summary and Postpartum booklet. Activity: Advance as tolerated. Pelvic rest for 6 weeks.  Diet: low salt diet Anticipated Birth Control: Plans Interval BTL Postpartum Appointment:6 weeks Additional Postpartum F/U: BP check 1 week Future Appointments:No future appointments. Follow up Visit:  Follow-up Information     [facility]. Schedule an appointment as soon as possible for a visit in 6 day(s).   Specialty: Obstetrics and Gynecology Contact information: [address] [place] [address] [phone]                    03/01/2023 [name], CNM

## 2023-03-01 NOTE — Plan of Care (Signed)
 Pt to be discharged home with printed instructions. Carmelina Dane, RN

## 2023-03-06 ENCOUNTER — Telehealth (HOSPITAL_COMMUNITY): Payer: Self-pay

## 2023-03-06 NOTE — Telephone Encounter (Signed)
 03/06/2023 1245  Name: Angelica Kim MRN: [id number] DOB: 1990-02-10  Reason for Call:  Transition of Care Hospital Discharge Call  Contact Status: Patient Contact Status: Message  Language assistant needed:          Follow-Up Questions:    Van Postnatal Depression Scale:  In the Past 7 Days:    PHQ2-9 Depression Scale:     Discharge Follow-up:    Post-discharge interventions: NA  Signature  Rosaline Deretha PEAK

## 2023-03-21 ENCOUNTER — Other Ambulatory Visit (HOSPITAL_COMMUNITY): Payer: Self-pay

## 2023-04-03 ENCOUNTER — Other Ambulatory Visit: Payer: Self-pay | Admitting: Obstetrics & Gynecology

## 2023-04-03 NOTE — Progress Notes (Signed)
 Sent message, via epic in basket, requesting orders in epic from Careers adviser.

## 2023-04-09 NOTE — Progress Notes (Signed)
 Anesthesia Review:  PCP: Cardiologist :  PPM/ ICD: Device Orders: Rep Notified:  Chest x-ray : EKG : 02/23/23  Echo : Stress test: Cardiac Cath :   Activity level:  Sleep Study/ CPAP : Fasting Blood Sugar :      / Checks Blood Sugar -- times a day:    Blood Thinner/ Instructions /Last Dose: ASA / Instructions/ Last Dose :    02/25/23- Childbirth

## 2023-04-10 NOTE — H&P (Incomplete)
 Angelica Kim is an 33 y.o. female [phone] who desires permanent sterilization via robot assisted bilateral salpingectomy.   Pertinent Gynecological History: Menses:  regular, monthly.  Bleeding: None currently.  Contraception: none DES exposure: unknown Blood transfusions: none Sexually transmitted diseases: no past history Previous GYN Procedures:  None.    Last mammogram:  N/A   Last pap: normal Date: 03/2023  Menstrual History: Menarche age: 37 Patient's last menstrual period was 06/11/2022 (exact date).    Past Medical History:  Diagnosis Date   Allergy to amoxicillin 02/07/2020   Ankylosing spondylitis (HCC)    Asthma    last used rescue inhaler "a while ago"   Diverticulitis 2014   Fibromyalgia    Gestational HTN, third trimester 12/07/2021   Gestational hypertension 08/03/2020   History of pre-eclampsia    History of premature delivery 02/07/2020   History of severe pre-eclampsia 07/30/2019   Hypertension in pregnancy, preeclampsia, severe, third trimester 12/08/2021   Kidney stones    Marginal insertion of umbilical cord affecting management of mother in second trimester 12/27/2022   Noted at anatomy, follow growth.     Migraine    Ovarian cyst    Preterm labor    SVD (spontaneous vaginal delivery) 12/09/2021    Past Surgical History:  Procedure Laterality Date   COLONOSCOPY     ESOPHAGOGASTRODUODENOSCOPY ENDOSCOPY     spinal tap      Family History  Problem Relation Age of Onset   Arthritis Mother    Deep vein thrombosis Mother    Kidney Stones Maternal Grandmother    Hypertension Maternal Grandmother    Heart disease Maternal Grandfather     Social History:  reports that she quit smoking about 11 years ago. Her smoking use included cigarettes. She has never used smokeless tobacco. She reports that she does not currently use alcohol. She reports that she does not use drugs.  Allergies:  Allergies  Allergen Reactions   Amoxicillin Hives,  Shortness Of Breath and Swelling    Swelling of hands, face, and lip   Current Outpatient Medications  Medication Instructions   albuterol (PROVENTIL) 2.5 mg, Nebulization, Every 6 hours PRN   albuterol (VENTOLIN HFA) 108 (90 Base) MCG/ACT inhaler 2 puffs, Inhalation, Every 6 hours PRN   budesonide-formoterol (SYMBICORT) 160-4.5 MCG/ACT inhaler 2 puffs, Inhalation, Daily   butalbital-acetaminophen-caffeine (FIORICET) 50-325-40 MG tablet 1-2 tablets, Oral, 2 times daily PRN   CHOLINE PO 110 mg, Oral, Daily   enalapril (VASOTEC) 10 mg, Oral, Daily   ferrous sulfate 325 mg, Oral, Every other day   ibuprofen (ADVIL) 600 mg, Oral, Every 6 hours   Magnesium 400 mg, Oral, Daily   mometasone (NASONEX) 50 MCG/ACT nasal spray 1 spray, Nasal, Daily   Prenatal Vit-DSS-Fe Cbn-FA (PRENATAL ADVANTAGE PO) 1 tablet, Oral, Daily    Review of Systems  Constitutional: Denies fevers/chills Cardiovascular: Denies chest pain or palpitations Pulmonary: Denies coughing or wheezing Gastrointestinal: Denies nausea, vomiting or diarrhea Genitourinary: Denies pelvic pain, unusual vaginal bleeding, unusual vaginal discharge, dysuria, urgency or frequency.  Musculoskeletal: Denies muscle or joint aches and pain.  Neurology: Denies abnormal sensations such as tingling or numbness.    Last menstrual period 06/11/2022, unknown if currently breastfeeding. Physical Exam Vitals reviewed. Constitutional: She is oriented to person, place, and time. She appears well-developed and well-nourished.  HENT:  Head: Normocephalic and atraumatic.  Eyes: Conjunctivae and EOM are normal.  Neck: Normal range of motion. Neck supple.  Cardiovascular: Normal rate, regular rhythm, normal heart sounds  and intact distal pulses.  Respiratory: Effort normal and breath sounds normal.  GI: Soft. She exhibits no mass. There is no tenderness.  Genitourinary: Vagina normal and uterus normal.  Musculoskeletal: Normal range of motion.   Neurological: She is alert and oriented to person, place, and time.  Skin: Skin is warm and dry.  Psychiatric: She has a normal mood and affect. Judgment normal.    CBC    Component Value Date/Time   WBC 12.7 (H) 02/27/2023 0604   RBC 3.32 (L) 02/27/2023 0604   HGB 9.3 (L) 02/27/2023 0604   HGB 10.8 (L) 10/30/2021 0924   HCT 28.0 (L) 02/27/2023 0604   HCT 31.3 (L) 10/30/2021 0924   PLT 186 02/27/2023 0604   PLT 167 10/30/2021 0924   MCV 84.3 02/27/2023 0604   MCV 87 10/30/2021 0924   MCH 28.0 02/27/2023 0604   MCHC 33.2 02/27/2023 0604   RDW 13.9 02/27/2023 0604   RDW 12.6 10/30/2021 0924   LYMPHSABS 2.4 12/29/2022 1408   LYMPHSABS 2.5 10/30/2021 0924   MONOABS 0.8 12/29/2022 1408   EOSABS 0.2 12/29/2022 1408   EOSABS 0.2 10/30/2021 0924   BASOSABS 0.1 12/29/2022 1408   BASOSABS 0.1 10/30/2021 0924    B POS   Assessment/Plan: 33 y/o Para 4 here for sterilization via robot assisted laparoscopic bilateral salpingectomy, - Admit to [facility] OR  - outpatient as per orders. - We discusssed risks, benefits and alternatives of robot assisted laparoscopic complete removal of both tubes to include but not limited to risks of bleeding, infection, damage to organs.  She also understood risk of tubal regret but she states she was 100% sure she did not want any more children.  We discussed complete removal of tubes would ensure sterilization and may also decrease her future risk of ovarian and fallopian tube cancer.  She understood there were other kinds of birth control such as pills, patches, iuds, vaginal rings, depo provera which were temporary but she did not desire them.  She understood there was also an option of female sterilization but she did not desire that method either.  She did not desire laparoscopic fulguration or placement of tubal clips. All questions were answered and consent forms were signed.   Prescilla Sours, MD.  04/10/2023, 10:25 PM

## 2023-04-11 ENCOUNTER — Encounter (HOSPITAL_COMMUNITY)
Admission: RE | Admit: 2023-04-11 | Discharge: 2023-04-11 | Disposition: A | Discharge status: Home / Self Care | Source: Ambulatory Visit

## 2023-04-11 ENCOUNTER — Encounter (HOSPITAL_COMMUNITY): Payer: Self-pay

## 2023-04-11 ENCOUNTER — Other Ambulatory Visit: Payer: Self-pay

## 2023-04-11 VITALS — Ht 60.0 in | Wt 160.0 lb

## 2023-04-11 DIAGNOSIS — Z01818 Encounter for other preprocedural examination: Secondary | ICD-10-CM

## 2023-04-11 HISTORY — DX: Personal history of urinary calculi: Z87.442

## 2023-04-11 HISTORY — DX: Nausea with vomiting, unspecified: R11.2

## 2023-04-11 HISTORY — DX: Other specified postprocedural states: Z98.890

## 2023-04-11 HISTORY — DX: Anemia, unspecified: D64.9

## 2023-04-11 HISTORY — DX: Anxiety disorder, unspecified: F41.9

## 2023-04-11 NOTE — Patient Instructions (Signed)
 SURGICAL WAITING ROOM VISITATION  Patients having surgery or a procedure may have no more than 2 support people in the waiting area - these visitors may rotate.    Children under the age of 32 must have an adult with them who is not the patient.  Due to an increase in RSV and influenza rates and associated hospitalizations, children ages 36 and under may not visit patients in [facility] hospitals.  Visitors with respiratory illnesses are discouraged from visiting and should remain at home.  If the patient needs to stay at the hospital during part of their recovery, the visitor guidelines for inpatient rooms apply. Pre-op nurse will coordinate an appropriate time for 1 support person to accompany patient in pre-op.  This support person may not rotate.    Please refer to the [facility] website for the visitor guidelines for Inpatients (after your surgery is over and you are in a regular room).       Your procedure is scheduled on:  04/12/2023    Report to [facility] Main Entrance    Report to admitting at   [phone]   Call this number if you have problems the morning of surgery [phone]   Do not eat food  or drink liquids :After Midnight.               If you have questions, please contact your surgeon's office.       Oral Hygiene is also important to reduce your risk of infection.                                    Remember - BRUSH YOUR TEETH THE MORNING OF SURGERY WITH YOUR REGULAR TOOTHPASTE  DENTURES WILL BE REMOVED PRIOR TO SURGERY PLEASE DO NOT APPLY "Poly grip" OR ADHESIVES!!!   Do NOT smoke after Midnight   Stop all vitamins and herbal supplements 7 days before surgery.   Take these medicines the morning of surgery with A SIP OF WATER: nebulizer if needed, inhalers as usual and bring   DO NOT TAKE ANY ORAL DIABETIC MEDICATIONS DAY OF YOUR SURGERY  Bring CPAP mask and tubing day of surgery.                              You may not have any metal on  your body including hair pins, jewelry, and body piercing             Do not wear make-up, lotions, powders, perfumes/cologne, or deodorant  Do not wear nail polish including gel and S&S, artificial/acrylic nails, or any other type of covering on natural nails including finger and toenails. If you have artificial nails, gel coating, etc. that needs to be removed by a nail salon please have this removed prior to surgery or surgery may need to be canceled/ delayed if the surgeon/ anesthesia feels like they are unable to be safely monitored.   Do not shave  48 hours prior to surgery.               Men may shave face and neck.   Do not bring valuables to the hospital. [facility] IS NOT             RESPONSIBLE   FOR VALUABLES.   Contacts, glasses, dentures or bridgework may not be worn into surgery.   Bring  small overnight bag day of surgery.   DO NOT BRING YOUR HOME MEDICATIONS TO THE HOSPITAL. PHARMACY WILL DISPENSE MEDICATIONS LISTED ON YOUR MEDICATION LIST TO YOU DURING YOUR ADMISSION IN THE HOSPITAL!    Patients discharged on the day of surgery will not be allowed to drive home.  Someone NEEDS to stay with you for the first 24 hours after anesthesia.   Special Instructions: Bring a copy of your healthcare power of attorney and living will documents the day of surgery if you haven't scanned them before.              Please read over the following fact sheets you were given: IF YOU HAVE QUESTIONS ABOUT YOUR PRE-OP INSTRUCTIONS PLEASE CALL [phone]   If you received a COVID test during your pre-op visit  it is requested that you wear a mask when out in public, stay away from anyone that may not be feeling well and notify your surgeon if you develop symptoms. If you test positive for Covid or have been in contact with anyone that has tested positive in the last 10 days please notify you surgeon.    [place] - Preparing for Surgery Before surgery, you can play an important role.   Because skin is not sterile, your skin needs to be as free of germs as possible.  You can reduce the number of germs on your skin by washing with CHG (chlorahexidine gluconate) soap before surgery.  CHG is an antiseptic cleaner which kills germs and bonds with the skin to continue killing germs even after washing. Please DO NOT use if you have an allergy to CHG or antibacterial soaps.  If your skin becomes reddened/irritated stop using the CHG and inform your nurse when you arrive at Short Stay. Do not shave (including legs and underarms) for at least 48 hours prior to the first CHG shower.  You may shave your face/neck. Please follow these instructions carefully:  1.  Shower with CHG Soap the night before surgery and the  morning of Surgery.  2.  If you choose to wash your hair, wash your hair first as usual with your  normal  shampoo.  3.  After you shampoo, rinse your hair and body thoroughly to remove the  shampoo.                           4.  Use CHG as you would any other liquid soap.  You can apply chg directly  to the skin and wash                       Gently with a scrungie or clean washcloth.  5.  Apply the CHG Soap to your body ONLY FROM THE NECK DOWN.   Do not use on face/ open                           Wound or open sores. Avoid contact with eyes, ears mouth and genitals (private parts).                       Wash face,  Genitals (private parts) with your normal soap.             6.  Wash thoroughly, paying special attention to the area where your surgery  will be performed.  7.  Thoroughly rinse your body with  warm water from the neck down.  8.  DO NOT shower/wash with your normal soap after using and rinsing off  the CHG Soap.                9.  Pat yourself dry with a clean towel.            10.  Wear clean pajamas.            11.  Place clean sheets on your bed the night of your first shower and do not  sleep with pets. Day of Surgery : Do not apply any lotions/deodorants the  morning of surgery.  Please wear clean clothes to the hospital/surgery center.  FAILURE TO FOLLOW THESE INSTRUCTIONS MAY RESULT IN THE CANCELLATION OF YOUR SURGERY PATIENT SIGNATURE_________________________________  NURSE SIGNATURE__________________________________  ________________________________________________________________________

## 2023-04-11 NOTE — Op Note (Addendum)
 Patient: Angelica Kim DOB: [birth year] MRN: [id number] Date of procedure: 04/12/2023  PREOP DIAGNOSIS:  34. 33 year old Para 4 female who desires permanent sterilization.      Post operative diagnosis:  Same as above.        Procedure:  Robot assisted laparoscopic bilateral salpingectomy.   Surgeon: Dr. Hoover Browns.  Assistant: Karmen Stabs, RNFA.   SURGEON ATTESTATION: I was present and scrubbed for the entire case.  An experienced assistant was required given the standard of surgical care given the complexity of the case.  This assistant was needed for exposure, retraction, instrument exchange and for overall help during the surgery.    Anesthesia: General ETA, Dr.Stoltzfus, Earl Lites.   Complications: None.  Findings: Normal uterus, normal left and right ovaries , normal right fallopian tube.  Left tube with 2 cm paratubal cyst. Normal bowels.   IV Fluids: per anesthesia.    Urine:  Straight catheterized, 200 cc.    EBL: 5 cc.   Indications: 33 y/o female Para 4 who desired permanent sterilization via bilateral salpingectomy.    Procedure: Informed consent was obtained from the patient to undergo the procedure after discussing risks that include but not limited to risks of bleeding, infection and damage to organs.  She was taken to the operating room and anesthesia was administered.  She was carefully positioned in the operating room table in dorsal lithotomy position with both arms tucked to her sides and on the Pink Trendelenburg Pad.  An exam done showed a small anteverted uterus and a closed cervix.  There were no palpable adnexal masses.   Abdomen was prepped with duraprep and perineum and vagina was prepped with betadine and straight catheterization done.    A Graves speculum was placed into vagina.  Tenaculum was placed at anterior cervix.  The Acorn manipulator was placed into cervix.  Patient was placed in low dorsal lithotomy position.  Attention was then turned to the  abdomen.  0.25% marcaine was injected in infraumbilical area and 3 mm incision made. Veress needle was placed in and correct placement was confirmed with irrigation, suction and hanging drop test.   Gas was instilled with low opening pressures noted and to a maximum pressure of 15 mm Hg.  The incision was extended for an 8 mm port.  The 8 mm robotic trocar was placed with direct visualization with the 0 degree scope.  Upon entry there was no notable injury to organs or vessels.   She was placed in T-burg position and maximum gas pressure was set to 15 mm Hg.  Two other side ports were placed on the left and right side of the abdomen under direct visualization for robot arms 1 and 3, lateral and a little below the infraumbilical incision, about 8 cm from umbilicus bilaterally.  The abdomen and pelvis were surveyed with the above findings.  The robot was docked.  The infraumbilical port was docked, 30 degree robot camera was placed in, targeting performed then the other ports were docked: Bipolar grasper was placed in Arm 1, Camera in arm 2, vessel sealer in arm 3.  From the fimbria to the cornual end the right fallopian tube was sealed then excised with the vessel sealer and placed in anterior cul de sac.  The left fallopian tube was similarly sealed and excised.  The specimens were delivered under direct visualization through arm 3 by the assistant using a laparoscopic grasper after removal of the vessel sealer.  A small paratubal cyst  in left fallopian tube was ruptured and with clear fluid drainage to allow delivery of the specimen through the port.  Good hemostasis was noted on the remaining pedicles.   The long bipolar grasper was then removed with direct visualization.  The [employer] was undocked.  Gas was turned off and was allowed to escape from the cavity, then the ports were removed under visualization with the 0 degree 5 mm camera.  Patient received five manual inflations from the anesthesia.  She  was then taken out of Trendelenburg.  The skin incisions were closed with 4-0 monocryl.  Dermabond was then applied over the skin incisions.  Attention was then turned to perineum.  Speculum was placed in and Acorn manipulator and tenaculum removed.  Hemostasis on the cervix was achieved with pressure.  Patient was then cleaned and taken to the recovery room in stable condition.  Specimens: Left and right fallopian tubes to pathology. Left tube with paratubal cyst.   Dr. Hoover Browns. 04/12/2023.

## 2023-04-12 ENCOUNTER — Ambulatory Visit (HOSPITAL_BASED_OUTPATIENT_CLINIC_OR_DEPARTMENT_OTHER): Admitting: Anesthesiology

## 2023-04-12 ENCOUNTER — Encounter (HOSPITAL_COMMUNITY)
Admission: RE | Disposition: A | Discharge status: Home / Self Care | Payer: Self-pay | Source: Home / Self Care | Attending: Obstetrics & Gynecology

## 2023-04-12 ENCOUNTER — Ambulatory Visit (HOSPITAL_COMMUNITY)
Admission: RE | Admit: 2023-04-12 | Discharge: 2023-04-12 | Disposition: A | Discharge status: Home / Self Care | Attending: Obstetrics & Gynecology | Admitting: Obstetrics & Gynecology

## 2023-04-12 ENCOUNTER — Other Ambulatory Visit: Payer: Self-pay

## 2023-04-12 ENCOUNTER — Ambulatory Visit (HOSPITAL_COMMUNITY): Admitting: Anesthesiology

## 2023-04-12 ENCOUNTER — Encounter (HOSPITAL_COMMUNITY): Payer: Self-pay | Admitting: Obstetrics & Gynecology

## 2023-04-12 DIAGNOSIS — Z302 Encounter for sterilization: Secondary | ICD-10-CM | POA: Insufficient documentation

## 2023-04-12 DIAGNOSIS — J45909 Unspecified asthma, uncomplicated: Secondary | ICD-10-CM

## 2023-04-12 DIAGNOSIS — M797 Fibromyalgia: Secondary | ICD-10-CM | POA: Insufficient documentation

## 2023-04-12 DIAGNOSIS — I10 Essential (primary) hypertension: Secondary | ICD-10-CM

## 2023-04-12 DIAGNOSIS — Z87891 Personal history of nicotine dependence: Secondary | ICD-10-CM | POA: Diagnosis not present

## 2023-04-12 DIAGNOSIS — Z8249 Family history of ischemic heart disease and other diseases of the circulatory system: Secondary | ICD-10-CM | POA: Insufficient documentation

## 2023-04-12 DIAGNOSIS — Z79899 Other long term (current) drug therapy: Secondary | ICD-10-CM | POA: Insufficient documentation

## 2023-04-12 DIAGNOSIS — Z01818 Encounter for other preprocedural examination: Secondary | ICD-10-CM

## 2023-04-12 DIAGNOSIS — D282 Benign neoplasm of uterine tubes and ligaments: Secondary | ICD-10-CM | POA: Diagnosis not present

## 2023-04-12 HISTORY — PX: XI ROBOTIC ASSISTED SALPINGECTOMY: SHX6824

## 2023-04-12 LAB — BASIC METABOLIC PANEL
Anion gap: 12 (ref 5–15)
BUN: 12 mg/dL (ref 6–20)
CO2: 23 mmol/L (ref 22–32)
Calcium: 9 mg/dL (ref 8.9–10.3)
Chloride: 105 mmol/L (ref 98–111)
Creatinine, Ser: 0.66 mg/dL (ref 0.44–1.00)
GFR, Estimated: >60 mL/min (ref >60)
Glucose, Bld: 89 mg/dL (ref 70–99)
Potassium: 3.5 mmol/L (ref 3.5–5.1)
Sodium: 140 mmol/L (ref 135–145)

## 2023-04-12 LAB — CBC
HCT: 40 % (ref 36.0–46.0)
Hemoglobin: 12.5 g/dL (ref 12.0–15.0)
MCH: 27.2 pg (ref 26.0–34.0)
MCHC: 31.3 g/dL (ref 30.0–36.0)
MCV: 87.1 fL (ref 80.0–100.0)
Platelets: 236 10*3/uL (ref 150–400)
RBC: 4.59 MIL/uL (ref 3.87–5.11)
RDW: 13.3 % (ref 11.5–15.5)
WBC: 10.4 10*3/uL (ref 4.0–10.5)
nRBC: 0 % (ref 0.0–0.2)

## 2023-04-12 LAB — POCT PREGNANCY, URINE: Preg Test, Ur: NEGATIVE

## 2023-04-12 LAB — TYPE AND SCREEN
ABO/RH(D): B POS
Antibody Screen: NEGATIVE

## 2023-04-12 SURGERY — SALPINGECTOMY, ROBOT-ASSISTED
Anesthesia: General | Site: Abdomen | Laterality: Bilateral

## 2023-04-12 MED ORDER — IBUPROFEN 600 MG PO TABS: 600.0000 mg | ORAL_TABLET | Freq: Four times a day (QID) | ORAL | 0 refills | Status: DC | PRN

## 2023-04-12 MED ORDER — BUPIVACAINE HCL (PF) 0.25 % IJ SOLN
INTRAMUSCULAR | Status: DC | PRN
  Administered 2023-04-12: 5 mL

## 2023-04-12 MED ORDER — FENTANYL CITRATE (PF) 100 MCG/2ML IJ SOLN
INTRAMUSCULAR | Status: AC
  Filled 2023-04-12: qty 2

## 2023-04-12 MED ORDER — KETOROLAC TROMETHAMINE 15 MG/ML IJ SOLN
INTRAMUSCULAR | Status: AC
  Filled 2023-04-12: qty 1

## 2023-04-12 MED ORDER — OXYCODONE HCL 5 MG PO TABS
ORAL_TABLET | ORAL | Status: AC
  Filled 2023-04-12: qty 1

## 2023-04-12 MED ORDER — FENTANYL CITRATE PF 50 MCG/ML IJ SOSY
25.0000 ug | PREFILLED_SYRINGE | INTRAMUSCULAR | Status: DC | PRN
  Administered 2023-04-12 (×2): 50 ug via INTRAVENOUS

## 2023-04-12 MED ORDER — OXYCODONE HCL 5 MG/5ML PO SOLN: 5.0000 mg | Freq: Once | ORAL | Status: DC | PRN

## 2023-04-12 MED ORDER — ORAL CARE MOUTH RINSE: 15.0000 mL | Freq: Once | OROMUCOSAL | Status: AC

## 2023-04-12 MED ORDER — CELECOXIB 200 MG PO CAPS
200.0000 mg | ORAL_CAPSULE | Freq: Once | ORAL | Status: AC
  Administered 2023-04-12: 200 mg via ORAL
  Filled 2023-04-12: qty 1

## 2023-04-12 MED ORDER — MIDAZOLAM HCL 2 MG/2ML IJ SOLN
INTRAMUSCULAR | Status: AC
  Filled 2023-04-12: qty 2

## 2023-04-12 MED ORDER — LIDOCAINE HCL (PF) 2 % IJ SOLN
INTRAMUSCULAR | Status: AC
  Filled 2023-04-12: qty 5

## 2023-04-12 MED ORDER — METHYLENE BLUE (ANTIDOTE) 1 % IV SOLN
INTRAVENOUS | Status: AC
  Filled 2023-04-12: qty 10

## 2023-04-12 MED ORDER — DEXAMETHASONE SODIUM PHOSPHATE 4 MG/ML IJ SOLN
INTRAMUSCULAR | Status: DC | PRN
  Administered 2023-04-12: 8 mg via INTRAVENOUS

## 2023-04-12 MED ORDER — ROCURONIUM BROMIDE 10 MG/ML (PF) SYRINGE
PREFILLED_SYRINGE | INTRAVENOUS | Status: AC
  Filled 2023-04-12: qty 10

## 2023-04-12 MED ORDER — OXYCODONE HCL 5 MG PO TABS: 5.0000 mg | ORAL_TABLET | Freq: Once | ORAL | Status: DC | PRN

## 2023-04-12 MED ORDER — STERILE WATER FOR IRRIGATION IR SOLN
Status: DC | PRN
  Administered 2023-04-12: 1000 mL

## 2023-04-12 MED ORDER — LIDOCAINE HCL (CARDIAC) PF 100 MG/5ML IV SOSY
PREFILLED_SYRINGE | INTRAVENOUS | Status: DC | PRN
Start: 2023-04-12 — End: 2023-04-12
  Administered 2023-04-12: 50 mg via INTRAVENOUS

## 2023-04-12 MED ORDER — ONDANSETRON HCL 4 MG/2ML IJ SOLN
INTRAMUSCULAR | Status: DC | PRN
  Administered 2023-04-12: 4 mg via INTRAVENOUS

## 2023-04-12 MED ORDER — PROPOFOL 10 MG/ML IV BOLUS
INTRAVENOUS | Status: DC | PRN
  Administered 2023-04-12: 180 mg via INTRAVENOUS

## 2023-04-12 MED ORDER — AMISULPRIDE (ANTIEMETIC) 5 MG/2ML IV SOLN
INTRAVENOUS | Status: AC
  Filled 2023-04-12: qty 4

## 2023-04-12 MED ORDER — SCOPOLAMINE 1 MG/3DAYS TD PT72
1.0000 | MEDICATED_PATCH | TRANSDERMAL | Status: DC
  Administered 2023-04-12: 1.5 mg via TRANSDERMAL
  Filled 2023-04-12: qty 1

## 2023-04-12 MED ORDER — KETOROLAC TROMETHAMINE 15 MG/ML IJ SOLN
15.0000 mg | Freq: Once | INTRAMUSCULAR | Status: AC
  Administered 2023-04-12: 15 mg via INTRAVENOUS

## 2023-04-12 MED ORDER — ONDANSETRON HCL 4 MG/2ML IJ SOLN
INTRAMUSCULAR | Status: AC
  Filled 2023-04-12: qty 2

## 2023-04-12 MED ORDER — SUGAMMADEX SODIUM 200 MG/2ML IV SOLN
INTRAVENOUS | Status: AC
  Filled 2023-04-12: qty 2

## 2023-04-12 MED ORDER — LACTATED RINGERS IV SOLN: INTRAVENOUS | Status: DC

## 2023-04-12 MED ORDER — ROCURONIUM BROMIDE 100 MG/10ML IV SOLN
INTRAVENOUS | Status: DC | PRN
  Administered 2023-04-12: 70 mg via INTRAVENOUS

## 2023-04-12 MED ORDER — FENTANYL CITRATE (PF) 100 MCG/2ML IJ SOLN
INTRAMUSCULAR | Status: DC | PRN
  Administered 2023-04-12: 100 ug via INTRAVENOUS

## 2023-04-12 MED ORDER — BUPIVACAINE HCL (PF) 0.25 % IJ SOLN
INTRAMUSCULAR | Status: AC
  Filled 2023-04-12: qty 30

## 2023-04-12 MED ORDER — AMISULPRIDE (ANTIEMETIC) 5 MG/2ML IV SOLN
10.0000 mg | Freq: Once | INTRAVENOUS | Status: AC | PRN
  Administered 2023-04-12: 10 mg via INTRAVENOUS

## 2023-04-12 MED ORDER — OXYCODONE HCL 5 MG PO TABS: 5.0000 mg | ORAL_TABLET | ORAL | 0 refills | Status: DC | PRN

## 2023-04-12 MED ORDER — POVIDONE-IODINE 10 % EX SWAB: 2.0000 | Freq: Once | CUTANEOUS | Status: DC

## 2023-04-12 MED ORDER — MIDAZOLAM HCL 5 MG/5ML IJ SOLN
INTRAMUSCULAR | Status: DC | PRN
  Administered 2023-04-12: 2 mg via INTRAVENOUS

## 2023-04-12 MED ORDER — ACETAMINOPHEN 500 MG PO TABS
1000.0000 mg | ORAL_TABLET | Freq: Once | ORAL | Status: AC
  Administered 2023-04-12: 1000 mg via ORAL
  Filled 2023-04-12: qty 2

## 2023-04-12 MED ORDER — FENTANYL CITRATE PF 50 MCG/ML IJ SOSY
PREFILLED_SYRINGE | INTRAMUSCULAR | Status: AC
  Filled 2023-04-12: qty 1

## 2023-04-12 MED ORDER — PROPOFOL 10 MG/ML IV BOLUS
INTRAVENOUS | Status: AC
  Filled 2023-04-12: qty 20

## 2023-04-12 MED ORDER — CHLORHEXIDINE GLUCONATE 0.12 % MT SOLN
15.0000 mL | Freq: Once | OROMUCOSAL | Status: AC
  Administered 2023-04-12: 15 mL via OROMUCOSAL

## 2023-04-12 MED ORDER — SUGAMMADEX SODIUM 200 MG/2ML IV SOLN
INTRAVENOUS | Status: DC | PRN
  Administered 2023-04-12: 200 mg via INTRAVENOUS

## 2023-04-12 MED ORDER — DEXAMETHASONE SODIUM PHOSPHATE 10 MG/ML IJ SOLN
INTRAMUSCULAR | Status: AC
  Filled 2023-04-12: qty 1

## 2023-04-12 SURGICAL SUPPLY — 43 items
APPLICATOR ARISTA FLEXITIP XL (MISCELLANEOUS) IMPLANT
CATH ROBINSON RED A/P 16FR (CATHETERS) ×1 IMPLANT
COVER BACK TABLE 60X90IN (DRAPES) ×1 IMPLANT
COVER TIP SHEARS 8 DVNC (MISCELLANEOUS) IMPLANT
DEFOGGER SCOPE WARMER CLEARIFY (MISCELLANEOUS) ×1 IMPLANT
DERMABOND ADVANCED .7 DNX12 (GAUZE/BANDAGES/DRESSINGS) ×1 IMPLANT
DRAPE ARM DVNC X/XI (DISPOSABLE) ×4 IMPLANT
DRAPE COLUMN DVNC XI (DISPOSABLE) ×1 IMPLANT
DRAPE SURG IRRIG POUCH 19X23 (DRAPES) ×1 IMPLANT
DRAPE UTILITY XL STRL (DRAPES) ×1 IMPLANT
DURAPREP 26ML APPLICATOR (WOUND CARE) ×1 IMPLANT
ELECT REM PT RETURN 9FT ADLT (ELECTROSURGICAL) ×1 IMPLANT
ELECTRODE REM PT RTRN 9FT ADLT (ELECTROSURGICAL) ×1 IMPLANT
FORCEPS BPLR LNG DVNC XI (INSTRUMENTS) ×1 IMPLANT
GAUZE 4X4 16PLY ~~LOC~~+RFID DBL (SPONGE) IMPLANT
GLOVE BIOGEL PI IND STRL 7.0 (GLOVE) ×3 IMPLANT
GLOVE SURG SS PI 6.5 STRL IVOR (GLOVE) ×2 IMPLANT
HEMOSTAT ARISTA ABSORB 3G PWDR (HEMOSTASIS) IMPLANT
IRRIG SUCT STRYKERFLOW 2 WTIP (MISCELLANEOUS) IMPLANT
IRRIGATION SUCT STRKRFLW 2 WTP (MISCELLANEOUS) IMPLANT
KIT TURNOVER CYSTO (KITS) ×1 IMPLANT
LEGGING LITHOTOMY PAIR STRL (DRAPES) IMPLANT
MANIFOLD NEPTUNE II (INSTRUMENTS) ×1 IMPLANT
NDL INSUFFLATION 14GA 120MM (NEEDLE) ×1 IMPLANT
NEEDLE INSUFFLATION 14GA 120MM (NEEDLE) ×1 IMPLANT
OBTURATOR OPTICAL STND 8 DVNC (TROCAR) ×1 IMPLANT
OBTURATOR OPTICALSTD 8 DVNC (TROCAR) ×1 IMPLANT
PACK ROBOT GYN CUSTOM WL (TRAY / TRAY PROCEDURE) ×1 IMPLANT
PACK ROBOTIC GOWN (GOWN DISPOSABLE) ×1 IMPLANT
PAD POSITIONING PINK XL (MISCELLANEOUS) ×1 IMPLANT
PAD PREP 24X48 CUFFED NSTRL (MISCELLANEOUS) ×1 IMPLANT
PROTECTOR NERVE ULNAR (MISCELLANEOUS) ×1 IMPLANT
SEAL UNIV 5-12 XI (MISCELLANEOUS) ×3 IMPLANT
SEALER VESSEL EXT DVNC XI (MISCELLANEOUS) ×1 IMPLANT
SET IRRIG Y TYPE TUR BLADDER L (SET/KITS/TRAYS/PACK) IMPLANT
SET TUBE SMOKE EVAC HIGH FLOW (TUBING) IMPLANT
SLEEVE SCD COMPRESS KNEE MED (STOCKING) ×1 IMPLANT
SUT MNCRL AB 4-0 PS2 18 (SUTURE) ×2 IMPLANT
SUT VICRYL 0 UR6 27IN ABS (SUTURE) IMPLANT
TOWEL OR 17X24 6PK STRL BLUE (TOWEL DISPOSABLE) ×1 IMPLANT
TRAY FOLEY MTR SLVR 16FR STAT (SET/KITS/TRAYS/PACK) ×1 IMPLANT
TUBING INSUFFLATION 10FT LAP (TUBING) ×1 IMPLANT
WATER STERILE IRR 1000ML POUR (IV SOLUTION) ×1 IMPLANT

## 2023-04-12 NOTE — Anesthesia Postprocedure Evaluation (Signed)
 Anesthesia Post Note  Patient: Angelica Kim  Procedure(s) Performed: SALPINGECTOMY, ROBOT-ASSISTED (Bilateral: Abdomen)     Patient location during evaluation: PACU Anesthesia Type: General Level of consciousness: awake and alert Pain management: pain level controlled Vital Signs Assessment: post-procedure vital signs reviewed and stable Respiratory status: spontaneous breathing, nonlabored ventilation, respiratory function stable and patient connected to nasal cannula oxygen Cardiovascular status: blood pressure returned to baseline and stable Postop Assessment: no apparent nausea or vomiting Anesthetic complications: no   No notable events documented.  Last Vitals:  Vitals:   04/12/23 1130 04/12/23 1159  BP: 120/89 (!) 133/93  Pulse: 90 91  Resp: 18 17  Temp:  37.2 C  SpO2: 97% 98%    Last Pain:  Vitals:   04/12/23 1159  TempSrc:   PainSc: 2                  Earl Lites P Maguire Killmer

## 2023-04-12 NOTE — Transfer of Care (Signed)
 Immediate Anesthesia Transfer of Care Note  Patient: Angelica Kim  Procedure(s) Performed: SALPINGECTOMY, ROBOT-ASSISTED (Bilateral: Abdomen)  Patient Location: PACU  Anesthesia Type:General  Level of Consciousness: awake and alert   Airway & Oxygen Therapy: Patient Spontanous Breathing and Patient connected to nasal cannula oxygen  Post-op Assessment: Report given to RN and Post -op Vital signs reviewed and stable  Post vital signs: Reviewed  Last Vitals:  Vitals Value Taken Time  BP 155/142 04/12/23 0937  Temp    Pulse 77 04/12/23 0937  Resp 17 04/12/23 0939  SpO2 98 04/12/23 0939  Vitals shown include unfiled device data.  Last Pain:  Vitals:   04/12/23 0709  TempSrc:   PainSc: 5          Complications: No notable events documented.

## 2023-04-12 NOTE — Interval H&P Note (Signed)
 History and Physical Interval Note:  04/12/2023 7:44 AM  Angelica Kim  has presented today for surgery, with the diagnosis of sterilization.  The various methods of treatment have been discussed with the patient and family. After consideration of risks, benefits and other options for treatment, the patient has consented to  Procedure(s): SALPINGECTOMY, ROBOT-ASSISTED (Bilateral) as a surgical intervention.  The patient's history has been reviewed, patient examined, no change in status, stable for surgery.  I have reviewed the patient's chart and labs.  Questions were answered to the patient's satisfaction.     Prescilla Sours

## 2023-04-12 NOTE — Progress Notes (Signed)
Negative urine pregnancy test.

## 2023-04-12 NOTE — Anesthesia Preprocedure Evaluation (Signed)
 Anesthesia Evaluation  Patient identified by MRN, date of birth, ID band Patient awake    Reviewed: Allergy & Precautions, NPO status , Patient's Chart, lab work & pertinent test results  History of Anesthesia Complications (+) PONV and history of anesthetic complications  Airway Mallampati: II  TM Distance: >3 FB Neck ROM: Full    Dental no notable dental hx.    Pulmonary asthma , former smoker   Pulmonary exam normal        Cardiovascular hypertension, Pt. on medications  Rhythm:Regular Rate:Normal     Neuro/Psych  Headaches  Anxiety        GI/Hepatic negative GI ROS, Neg liver ROS,,,  Endo/Other  negative endocrine ROS    Renal/GU negative Renal ROS  Female GU complaint     Musculoskeletal  (+)  Fibromyalgia -  Abdominal Normal abdominal exam  (+)   Peds  Hematology  (+) Blood dyscrasia, anemia Lab Results      Component                Value               Date                      WBC                      12.7 (H)            02/27/2023                HGB                      9.3 (L)             02/27/2023                HCT                      28.0 (L)            02/27/2023                MCV                      84.3                02/27/2023                PLT                      186                 02/27/2023              Anesthesia Other Findings   Reproductive/Obstetrics                             Anesthesia Physical Anesthesia Plan  ASA: 2  Anesthesia Plan: General   Post-op Pain Management:    Induction: Intravenous  PONV Risk Score and Plan: 4 or greater and Ondansetron, Dexamethasone, Midazolam, Treatment may vary due to age or medical condition, Scopolamine patch - Pre-op and Amisulpride  Airway Management Planned: Mask and Oral ETT  Additional Equipment: None  Intra-op Plan:   Post-operative Plan: Extubation in OR  Informed Consent: I have reviewed the  patients History and Physical, chart, labs and  discussed the procedure including the risks, benefits and alternatives for the proposed anesthesia with the patient or authorized representative who has indicated his/her understanding and acceptance.     Dental advisory given  Plan Discussed with: CRNA  Anesthesia Plan Comments:        Anesthesia Quick Evaluation

## 2023-04-12 NOTE — Anesthesia Procedure Notes (Signed)
 Procedure Name: Intubation Date/Time: 04/12/2023 8:08 AM  Performed by: Carloyn Manner, CRNAPre-anesthesia Checklist: Patient identified, Emergency Drugs available, Suction available, Patient being monitored and Timeout performed Patient Re-evaluated:Patient Re-evaluated prior to induction Oxygen Delivery Method: Circle system utilized Preoxygenation: Pre-oxygenation with 100% oxygen Induction Type: IV induction Ventilation: Mask ventilation without difficulty Laryngoscope Size: Miller and 2 Grade View: Grade I Tube type: Oral Tube size: 7.0 mm Number of attempts: 1 Airway Equipment and Method: Stylet Placement Confirmation: ETT inserted through vocal cords under direct vision, positive ETCO2 and breath sounds checked- equal and bilateral Secured at: 22 cm Tube secured with: Tape Dental Injury: Teeth and Oropharynx as per pre-operative assessment

## 2023-04-12 NOTE — Discharge Instructions (Signed)
  [place],  You may shower as usual, keeping water exposure over the abdomen to a minimal and pat the incisions dry after showering.      You may see a sticky substance over the incisions, this is glue used for the incisions and will fall off by itself.   3.  No tampons or douching or vaginal intercourse after the procedure until at least two weeks after the surgery and after having been seen at the office.     4. Expect some light to moderate vaginal bleeding for the next 2 to 7 days but do let me know if with heavy bleeding requiring you to change a pad every two hours or less.      5. You may have some back pain because of the gas used during the procedure, this should improve with time as you ambulate regularly.   6. Please ambulate regularly at least 1 hour every day in addition to your activities of daily living.   7.  You may see bruising around and near the incisions, this  will usually improve and resolve over time.  Let me know however if it is expanding or worsening.   8.  Call me if with excessive pain not improving with medication use, excessive vaginal bleeding or fevers or chills or any other concerns.    9. Use pain medication of tylenol over the counter as well as ordered ibuprofen and oxycodone for pain as needed. Please note that Ibuprofen may increase your risk of stomach ulcers. Please ensure that you take ibuprofen after a meal and look out for symptoms of stomach ulcers such as uncontrolled pain in epigastric region. Stop the ibuprofen if with such symptoms.   10.  Do not do any heavy lifting for the next 6 weeks, i.e nothing more then 30 lbs.   11. You may return to work between 2 days to 2 weeks depending on how you feel after the surgery.     I wish you a quick recovery,  Dr. Sallye Ober Phone: [phone] extension [phone] if regular business hours.   If After regular hours: call  office number and press the extension to speak to provider on call.

## 2023-04-13 ENCOUNTER — Encounter (HOSPITAL_COMMUNITY): Payer: Self-pay | Admitting: Obstetrics & Gynecology

## 2023-04-15 LAB — SURGICAL PATHOLOGY

## 2023-04-30 ENCOUNTER — Encounter (HOSPITAL_COMMUNITY): Payer: Self-pay | Admitting: Physician Assistant

## 2023-04-30 ENCOUNTER — Other Ambulatory Visit: Payer: Self-pay | Admitting: Physician Assistant

## 2023-04-30 ENCOUNTER — Ambulatory Visit: Attending: Physician Assistant

## 2023-04-30 DIAGNOSIS — M25572 Pain in left ankle and joints of left foot: Secondary | ICD-10-CM

## 2023-05-07 ENCOUNTER — Other Ambulatory Visit (HOSPITAL_COMMUNITY): Payer: Self-pay

## 2023-05-07 MED ORDER — LISDEXAMFETAMINE DIMESYLATE 30 MG PO CAPS
30.0000 mg | ORAL_CAPSULE | Freq: Every morning | ORAL | 0 refills | Status: DC
  Filled 2023-05-07 (×2): qty 30, 30d supply, fill #0

## 2023-06-10 ENCOUNTER — Other Ambulatory Visit (HOSPITAL_COMMUNITY): Payer: Self-pay

## 2023-06-10 ENCOUNTER — Other Ambulatory Visit (HOSPITAL_BASED_OUTPATIENT_CLINIC_OR_DEPARTMENT_OTHER): Payer: Self-pay

## 2023-06-10 MED ORDER — FLUOXETINE HCL 20 MG PO CAPS
20.0000 mg | ORAL_CAPSULE | Freq: Every morning | ORAL | 3 refills | Status: AC
  Filled 2023-06-10: qty 30, 30d supply, fill #0

## 2023-06-10 MED ORDER — FLUOXETINE HCL 10 MG PO CAPS
10.0000 mg | ORAL_CAPSULE | Freq: Every morning | ORAL | 0 refills | Status: AC
  Filled 2023-06-10: qty 7, 7d supply, fill #0

## 2023-06-17 ENCOUNTER — Other Ambulatory Visit (HOSPITAL_COMMUNITY): Payer: Self-pay

## 2023-06-17 MED ORDER — LISDEXAMFETAMINE DIMESYLATE 30 MG PO CAPS
30.0000 mg | ORAL_CAPSULE | Freq: Every morning | ORAL | 0 refills | Status: AC
  Filled 2023-06-17: qty 30, 30d supply, fill #0

## 2023-07-08 ENCOUNTER — Other Ambulatory Visit (HOSPITAL_COMMUNITY): Payer: Self-pay

## 2023-07-08 MED ORDER — AMPHETAMINE-DEXTROAMPHETAMINE 5 MG PO TABS
5.0000 mg | ORAL_TABLET | Freq: Every evening | ORAL | 0 refills | Status: AC | PRN
  Filled 2023-07-08: qty 12, 12d supply, fill #0

## 2023-07-08 MED ORDER — FLUOXETINE HCL 40 MG PO CAPS
40.0000 mg | ORAL_CAPSULE | Freq: Every morning | ORAL | 3 refills | Status: AC
  Filled 2023-07-08: qty 30, 30d supply, fill #0
  Filled 2023-08-06: qty 30, 30d supply, fill #1

## 2023-07-08 MED ORDER — DYANAVEL XR 10 MG PO TBCR
10.0000 mg | EXTENDED_RELEASE_TABLET | Freq: Every morning | ORAL | 0 refills | Status: AC
  Filled 2023-07-08 – 2023-07-09 (×2): qty 30, 30d supply, fill #0

## 2023-07-09 ENCOUNTER — Other Ambulatory Visit (HOSPITAL_COMMUNITY): Payer: Self-pay

## 2023-07-10 ENCOUNTER — Other Ambulatory Visit (HOSPITAL_COMMUNITY): Payer: Self-pay

## 2023-07-10 MED ORDER — AMPHETAMINE-DEXTROAMPHET ER 20 MG PO CP24
20.0000 mg | ORAL_CAPSULE | Freq: Every morning | ORAL | 0 refills | Status: AC
  Filled 2023-07-10 – 2023-07-12 (×2): qty 30, 30d supply, fill #0

## 2023-07-12 ENCOUNTER — Other Ambulatory Visit (HOSPITAL_COMMUNITY): Payer: Self-pay

## 2023-08-01 ENCOUNTER — Emergency Department (HOSPITAL_COMMUNITY)

## 2023-08-01 ENCOUNTER — Encounter (HOSPITAL_COMMUNITY): Payer: Self-pay | Admitting: *Deleted

## 2023-08-01 ENCOUNTER — Other Ambulatory Visit: Payer: Self-pay

## 2023-08-01 ENCOUNTER — Emergency Department (HOSPITAL_COMMUNITY)
Admission: EM | Admit: 2023-08-01 | Discharge: 2023-08-01 | Disposition: A | Discharge status: Home / Self Care | Attending: Emergency Medicine | Admitting: Emergency Medicine

## 2023-08-01 DIAGNOSIS — S0990XA Unspecified injury of head, initial encounter: Secondary | ICD-10-CM | POA: Diagnosis present

## 2023-08-01 LAB — PREGNANCY, URINE: Preg Test, Ur: NEGATIVE

## 2023-08-01 MED ORDER — KETOROLAC TROMETHAMINE 10 MG PO TABS: 10.0000 mg | ORAL_TABLET | Freq: Four times a day (QID) | ORAL | 0 refills | Status: DC | PRN

## 2023-08-01 MED ORDER — ONDANSETRON 4 MG PO TBDP
4.0000 mg | ORAL_TABLET | Freq: Once | ORAL | Status: AC
  Administered 2023-08-01: 4 mg via ORAL
  Filled 2023-08-01: qty 1

## 2023-08-01 MED ORDER — PROMETHAZINE HCL 25 MG PO TABS: 25.0000 mg | ORAL_TABLET | Freq: Four times a day (QID) | ORAL | 0 refills | Status: AC | PRN
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
## 2023-08-06 ENCOUNTER — Other Ambulatory Visit (HOSPITAL_COMMUNITY): Payer: Self-pay

## 2023-08-06 MED ORDER — FLUOXETINE HCL 20 MG PO CAPS
20.0000 mg | ORAL_CAPSULE | Freq: Every morning | ORAL | 3 refills | Status: AC
  Filled 2023-08-06: qty 90, 90d supply, fill #0

## 2023-08-06 MED ORDER — AMPHETAMINE-DEXTROAMPHET ER 30 MG PO CP24
30.0000 mg | ORAL_CAPSULE | Freq: Every morning | ORAL | 0 refills | Status: DC
  Filled 2023-08-06: qty 30, 30d supply, fill #0

## 2023-08-06 MED ORDER — AMPHETAMINE-DEXTROAMPHETAMINE 10 MG PO TABS
10.0000 mg | ORAL_TABLET | Freq: Every evening | ORAL | 0 refills | Status: DC | PRN
  Filled 2023-08-06: qty 20, 20d supply, fill #0

## 2023-08-21 ENCOUNTER — Emergency Department
Admission: EM | Admit: 2023-08-21 | Discharge: 2023-08-21 | Disposition: A | Discharge status: Home / Self Care | Attending: Emergency Medicine | Admitting: Emergency Medicine

## 2023-08-21 ENCOUNTER — Emergency Department

## 2023-08-21 ENCOUNTER — Other Ambulatory Visit: Payer: Self-pay

## 2023-08-21 ENCOUNTER — Encounter: Payer: Self-pay | Admitting: Emergency Medicine

## 2023-08-21 DIAGNOSIS — K2900 Acute gastritis without bleeding: Secondary | ICD-10-CM | POA: Diagnosis not present

## 2023-08-21 DIAGNOSIS — J45909 Unspecified asthma, uncomplicated: Secondary | ICD-10-CM | POA: Diagnosis not present

## 2023-08-21 DIAGNOSIS — R0789 Other chest pain: Secondary | ICD-10-CM | POA: Diagnosis present

## 2023-08-21 DIAGNOSIS — D72829 Elevated white blood cell count, unspecified: Secondary | ICD-10-CM | POA: Diagnosis not present

## 2023-08-21 LAB — COMPREHENSIVE METABOLIC PANEL WITH GFR
ALT: 24 U/L (ref 0–44)
AST: 22 U/L (ref 15–41)
Albumin: 4.2 g/dL (ref 3.5–5.0)
Alkaline Phosphatase: 89 U/L (ref 38–126)
Anion gap: 12 (ref 5–15)
BUN: 9 mg/dL (ref 6–20)
CO2: 24 mmol/L (ref 22–32)
Calcium: 9.4 mg/dL (ref 8.9–10.3)
Chloride: 101 mmol/L (ref 98–111)
Creatinine, Ser: 0.82 mg/dL (ref 0.44–1.00)
GFR, Estimated: >60 mL/min (ref >60)
Glucose, Bld: 97 mg/dL (ref 70–99)
Potassium: 4 mmol/L (ref 3.5–5.1)
Sodium: 137 mmol/L (ref 135–145)
Total Bilirubin: 0.5 mg/dL (ref 0.0–1.2)
Total Protein: 7.3 g/dL (ref 6.5–8.1)

## 2023-08-21 LAB — CBC
HCT: 42.2 % (ref 36.0–46.0)
Hemoglobin: 13.8 g/dL (ref 12.0–15.0)
MCH: 28.5 pg (ref 26.0–34.0)
MCHC: 32.7 g/dL (ref 30.0–36.0)
MCV: 87.2 fL (ref 80.0–100.0)
Platelets: 316 K/uL (ref 150–400)
RBC: 4.84 MIL/uL (ref 3.87–5.11)
RDW: 12.8 % (ref 11.5–15.5)
WBC: 12.5 K/uL — ABNORMAL HIGH (ref 4.0–10.5)
nRBC: 0 % (ref 0.0–0.2)

## 2023-08-21 LAB — TROPONIN I (HIGH SENSITIVITY): Troponin I (High Sensitivity): 3 ng/L (ref <18)

## 2023-08-21 LAB — D-DIMER, QUANTITATIVE: D-Dimer, Quant: <0.27 ug{FEU}/mL (ref 0.00–0.50)

## 2023-08-21 MED ORDER — IOHEXOL 350 MG/ML SOLN
100.0000 mL | Freq: Once | INTRAVENOUS | Status: AC | PRN
  Administered 2023-08-21: 100 mL via INTRAVENOUS

## 2023-08-21 MED ORDER — PANTOPRAZOLE SODIUM 40 MG PO TBEC: 40.0000 mg | DELAYED_RELEASE_TABLET | Freq: Every day | ORAL | 1 refills | Status: AC

## 2023-08-21 MED ORDER — ONDANSETRON 4 MG PO TBDP
4.0000 mg | ORAL_TABLET | Freq: Three times a day (TID) | ORAL | 0 refills | Status: AC | PRN
Start: 2023-08-21 — End: ?

## 2023-08-21 NOTE — ED Notes (Signed)
 Pt to CT via stretcher

## 2023-08-21 NOTE — ED Notes (Signed)
 CCMD called and pt placed on cardiac monitoring.

## 2023-08-21 NOTE — ED Notes (Signed)
 D/C, new RX, f/up, and reasons to return discussed with pt, pt verbalized understanding. Pt ambulatory with steady gait on D/C. VSS on D/C.
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
## 2023-08-21 NOTE — ED Notes (Signed)
 Pt presents to ED with c/o of L sided CP that radiates to to her back, pt states pain is above L flank area. Pt states HX of pericarditis X3. Pt states CP started 3 days ago. Pt denies HX of blood clots but endorses mother HX of blood clots. Pt is A&Ox4. NAD noted.

## 2023-08-21 NOTE — ED Triage Notes (Signed)
 Pt via POV from home. Pt c/o mid-sternal CP, that radiates to L side of her chest into the L side of her back. Reports its been going on for 3 days, thought it was indigestion but OTC medication did not help. Denies SOB. Reports last night at work she became diaphoretic and HR increased to the 110s. Pt has a hx of pericarditis but denies any other significant cardiac hx. Pt is A&OX4 and NAD, ambulatory to triage with steady gait.

## 2023-08-21 NOTE — ED Notes (Signed)
Pt to XRAY via stretcher.

## 2023-09-07 ENCOUNTER — Encounter (HOSPITAL_COMMUNITY): Payer: Self-pay | Admitting: Emergency Medicine

## 2023-09-07 ENCOUNTER — Other Ambulatory Visit: Payer: Self-pay

## 2023-09-07 ENCOUNTER — Emergency Department (HOSPITAL_COMMUNITY)
Admission: EM | Admit: 2023-09-07 | Discharge: 2023-09-07 | Disposition: A | Discharge status: Home / Self Care | Attending: Emergency Medicine | Admitting: Emergency Medicine

## 2023-09-07 DIAGNOSIS — K0889 Other specified disorders of teeth and supporting structures: Secondary | ICD-10-CM | POA: Insufficient documentation

## 2023-09-07 DIAGNOSIS — R519 Headache, unspecified: Secondary | ICD-10-CM | POA: Insufficient documentation

## 2023-09-07 LAB — BASIC METABOLIC PANEL WITH GFR
Anion gap: 10 (ref 5–15)
BUN: 9 mg/dL (ref 6–20)
CO2: 22 mmol/L (ref 22–32)
Calcium: 9 mg/dL (ref 8.9–10.3)
Chloride: 106 mmol/L (ref 98–111)
Creatinine, Ser: 0.77 mg/dL (ref 0.44–1.00)
GFR, Estimated: >60 mL/min (ref >60)
Glucose, Bld: 105 mg/dL — ABNORMAL HIGH (ref 70–99)
Potassium: 3.8 mmol/L (ref 3.5–5.1)
Sodium: 138 mmol/L (ref 135–145)

## 2023-09-07 LAB — CBC
HCT: 38.2 % (ref 36.0–46.0)
Hemoglobin: 13.1 g/dL (ref 12.0–15.0)
MCH: 29.8 pg (ref 26.0–34.0)
MCHC: 34.3 g/dL (ref 30.0–36.0)
MCV: 86.8 fL (ref 80.0–100.0)
Platelets: 298 K/uL (ref 150–400)
RBC: 4.4 MIL/uL (ref 3.87–5.11)
RDW: 13.1 % (ref 11.5–15.5)
WBC: 11.5 K/uL — ABNORMAL HIGH (ref 4.0–10.5)
nRBC: 0 % (ref 0.0–0.2)

## 2023-09-07 MED ORDER — KETOROLAC TROMETHAMINE 30 MG/ML IJ SOLN
30.0000 mg | Freq: Once | INTRAMUSCULAR | Status: AC
  Administered 2023-09-07: 30 mg via INTRAMUSCULAR
  Filled 2023-09-07: qty 1

## 2023-09-07 MED ORDER — OXYCODONE-ACETAMINOPHEN 5-325 MG PO TABS: ORAL_TABLET | ORAL | 0 refills | Status: AC

## 2023-09-07 MED ORDER — ONDANSETRON HCL 4 MG/2ML IJ SOLN
4.0000 mg | Freq: Once | INTRAMUSCULAR | Status: AC
  Administered 2023-09-07: 4 mg via INTRAVENOUS
  Filled 2023-09-07: qty 2

## 2023-09-07 MED ORDER — CLINDAMYCIN HCL 300 MG PO CAPS: 300.0000 mg | ORAL_CAPSULE | Freq: Four times a day (QID) | ORAL | 0 refills | Status: AC

## 2023-09-07 NOTE — ED Provider Notes (Signed)
 [place] EMERGENCY DEPARTMENT AT [facility] Provider Note   CSN: [id number] Arrival date & time: 09/07/23  9379     Patient presents with: Migraine   Angelica Kim is a 33 y.o. female.   Patient complains of a headache.  She also has recently broken tooth  The history is provided by the patient and medical records.  Migraine This is a new problem. The current episode started more than 2 days ago. The problem occurs constantly. The problem has not changed since onset.Associated symptoms include headaches. Pertinent negatives include no chest pain and no abdominal pain. Nothing aggravates the symptoms. Nothing relieves the symptoms. She has tried nothing for the symptoms.       Prior to Admission medications   Medication Sig Start Date End Date Taking? Authorizing Provider  clindamycin  (CLEOCIN ) 300 MG capsule Take 1 capsule (300 mg total) by mouth 4 (four) times daily. X 7 days 09/07/23  Yes Willia Lampert, MD  oxyCODONE -acetaminophen  (PERCOCET/ROXICET) 5-325 MG tablet Take 1 every 6-8 hours for pain that is not relieved by Motrin  or Tylenol  alone 09/07/23  Yes Synai Prettyman, MD  albuterol  (PROVENTIL ) (2.5 MG/3ML) 0.083% nebulizer solution Take 3 mLs (2.5 mg total) by nebulization every 6 (six) hours as needed for wheezing or shortness of breath. 02/23/21 04/08/23  Joesph Shaver Scales, PA-C  albuterol  (VENTOLIN  HFA) 108 (90 Base) MCG/ACT inhaler Inhale 2 puffs into the lungs every 6 (six) hours as needed for wheezing or shortness of breath. 10/30/21   Lynwood Lenis, PA-C  Amphetamine  ER (DYANAVEL  XR) 10 MG TBCR Take 1 tablet (10 mg) by mouth in the morning. 07/08/23     amphetamine -dextroamphetamine  (ADDERALL XR) 20 MG 24 hr capsule Take 1 capsule (20 mg total) by mouth every morning. 07/10/23     amphetamine -dextroamphetamine  (ADDERALL XR) 30 MG 24 hr capsule Take 1 capsule (30 mg total) by mouth every morning. 08/06/23     amphetamine -dextroamphetamine  (ADDERALL) 10 MG tablet  Take 1 tablet (10 mg total) by mouth at bedtime as needed. 08/06/23     amphetamine -dextroamphetamine  (ADDERALL) 5 MG tablet Take 1 tablet (5 mg total) by mouth at bedtime as needed. 07/08/23     budesonide-formoterol (SYMBICORT) 160-4.5 MCG/ACT inhaler Inhale 2 puffs into the lungs daily.    [provider]  butalbital -acetaminophen -caffeine  (FIORICET ) 50-325-40 MG tablet Take 1-2 tablets by mouth 2 (two) times daily as needed for headache.    [provider]  CHOLINE PO Take 110 mg by mouth daily.    [provider]  enalapril  (VASOTEC ) 10 MG tablet Take 1 tablet (10 mg total) by mouth daily. Patient not taking: Reported on 04/08/2023 03/02/23   Grice, Vivian B, CNM  ferrous sulfate  325 (65 FE) MG tablet Take 1 tablet (325 mg total) by mouth every other day. Patient not taking: Reported on 04/08/2023 08/05/20   Holshouser, Suzen NOVAK, CNM  FLUoxetine  (PROZAC ) 10 MG capsule Take 1 capsule (10 mg total) by mouth every morning. 06/10/23     FLUoxetine  (PROZAC ) 20 MG capsule Take 1 capsule (20 mg total) by mouth every morning. 06/10/23     FLUoxetine  (PROZAC ) 20 MG capsule Take 1 capsule (20 mg total) by mouth every morning with 40mg  capsule for total dose of 60mg . 08/06/23     FLUoxetine  (PROZAC ) 40 MG capsule Take 1 capsule (40 mg total) by mouth in the morning. 07/08/23     ibuprofen  (ADVIL ) 600 MG tablet Take 1 tablet (600 mg total) by mouth every 6 (  six) hours as needed. 04/12/23   Gloriann Chick, MD  ketorolac  (TORADOL ) 10 MG tablet Take 1 tablet (10 mg total) by mouth every 6 (six) hours as needed. 08/01/23   Zelaya, Oscar A, PA-C  lisdexamfetamine (VYVANSE ) 30 MG capsule Take 1 capsule (30 mg total) by mouth in the morning. 06/16/23     Magnesium  200 MG CHEW Chew 400 mg by mouth daily.    [provider]  mometasone (NASONEX) 50 MCG/ACT nasal spray Place 1 spray into the nose daily.    [provider]  ondansetron  (ZOFRAN -ODT) 4 MG disintegrating tablet Take 1 tablet (4  mg total) by mouth every 8 (eight) hours as needed for nausea or vomiting. 08/21/23   Arlander Charleston, MD  pantoprazole  (PROTONIX ) 40 MG tablet Take 1 tablet (40 mg total) by mouth daily. 08/21/23 08/20/24  Arlander [place], MD  Prenatal Vit-DSS-Fe Cbn-FA (PRENATAL ADVANTAGE PO) Take 1 tablet by mouth daily.    [provider]  promethazine  (PHENERGAN ) 25 MG tablet Take 1 tablet (25 mg total) by mouth every 6 (six) hours as needed for nausea or vomiting. 08/01/23   Zelaya, Oscar A, PA-C    Allergies: Amoxicillin    Review of Systems  Constitutional:  Negative for appetite change and fatigue.  HENT:  Negative for congestion, ear discharge and sinus pressure.        Toothache  Eyes:  Negative for discharge.  Respiratory:  Negative for cough.   Cardiovascular:  Negative for chest pain.  Gastrointestinal:  Negative for abdominal pain and diarrhea.  Genitourinary:  Negative for frequency and hematuria.  Musculoskeletal:  Negative for back pain.  Skin:  Negative for rash.  Neurological:  Positive for headaches. Negative for seizures.  Psychiatric/Behavioral:  Negative for hallucinations.     Updated Vital Signs BP 107/76   Pulse 76   Temp 97.7 F (36.5 C)   Resp 15   Ht 5' (1.524 m)   Wt 72.1 kg   LMP 07/15/2023 (Approximate)   SpO2 98%   BMI 31.05 kg/m   Physical Exam Vitals and nursing note reviewed.  Constitutional:      Appearance: She is well-developed.  HENT:     Head: Normocephalic.     Nose: Nose normal.     Mouth/Throat:     Mouth: Mucous membranes are moist.     Comments: Broken left upper tooth Eyes:     General: No scleral icterus.    Conjunctiva/sclera: Conjunctivae normal.  Neck:     Thyroid : No thyromegaly.  Cardiovascular:     Rate and Rhythm: Normal rate and regular rhythm.     Heart sounds: No murmur heard.    No friction rub. No gallop.  Pulmonary:     Breath sounds: No stridor. No wheezing or rales.  Chest:     Chest wall: No tenderness.   Abdominal:     General: There is no distension.     Tenderness: There is no abdominal tenderness. There is no rebound.  Musculoskeletal:        General: Normal range of motion.     Cervical back: Neck supple.  Lymphadenopathy:     Cervical: No cervical adenopathy.  Skin:    Findings: No erythema or rash.  Neurological:     Mental Status: She is alert and oriented to person, place, and time.     Motor: No abnormal muscle tone.     Coordination: Coordination normal.  Psychiatric:        Behavior:  Behavior normal.     (all labs ordered are listed, but only abnormal results are displayed) Labs Reviewed  BASIC METABOLIC PANEL WITH GFR - Abnormal; Notable for the following components:      Result Value   Glucose, Bld 105 (*)    All other components within normal limits  CBC - Abnormal; Notable for the following components:   WBC 11.5 (*)    All other components within normal limits    EKG: None  Radiology: No results found.   Procedures   Medications Ordered in the ED  ondansetron  (ZOFRAN ) injection 4 mg (4 mg Intravenous Given 09/07/23 9361)  ketorolac  (TORADOL ) 30 MG/ML injection 30 mg (30 mg Intramuscular Given 09/07/23 0745)                                    Medical Decision Making Amount and/or Complexity of Data Reviewed Labs: ordered.  Risk Prescription drug management.   Patient with a possibly secondary to broken tooth.  She has been on clindamycin  and Percocet and will follow-up with a dentist     Final diagnoses:  Bad headache  Toothache    ED Discharge Orders          Ordered    clindamycin  (CLEOCIN ) 300 MG capsule  4 times daily        09/07/23 1005    oxyCODONE -acetaminophen  (PERCOCET/ROXICET) 5-325 MG tablet        09/07/23 1005               Suzette Pac, MD 09/07/23 1707

## 2023-09-07 NOTE — Discharge Instructions (Signed)
Follow up with a dentist next week. °

## 2023-09-07 NOTE — ED Triage Notes (Signed)
 Pt with c/o migraine since last night despite taking Tylenol  and Migraine medicine.

## 2023-09-10 ENCOUNTER — Other Ambulatory Visit: Payer: Self-pay

## 2023-09-10 ENCOUNTER — Other Ambulatory Visit (HOSPITAL_COMMUNITY): Payer: Self-pay

## 2023-09-10 ENCOUNTER — Encounter (HOSPITAL_COMMUNITY): Payer: Self-pay

## 2023-09-10 MED ORDER — AMPHETAMINE-DEXTROAMPHETAMINE 10 MG PO TABS
10.0000 mg | ORAL_TABLET | Freq: Every evening | ORAL | 0 refills | Status: DC | PRN
  Filled 2023-09-10: qty 20, 20d supply, fill #0

## 2023-09-10 MED ORDER — FLUOXETINE HCL 40 MG PO CAPS
40.0000 mg | ORAL_CAPSULE | Freq: Every morning | ORAL | 1 refills | Status: AC
  Filled 2023-09-10: qty 90, 90d supply, fill #0

## 2023-09-10 MED ORDER — AMPHETAMINE-DEXTROAMPHET ER 30 MG PO CP24
30.0000 mg | ORAL_CAPSULE | Freq: Every morning | ORAL | 0 refills | Status: DC
  Filled 2023-09-10 – 2023-09-25 (×3): qty 30, 30d supply, fill #0

## 2023-09-10 MED ORDER — FLUOXETINE HCL 20 MG PO CAPS
20.0000 mg | ORAL_CAPSULE | Freq: Every morning | ORAL | 1 refills | Status: AC
  Filled 2023-09-10: qty 90, 90d supply, fill #0

## 2023-09-10 MED ORDER — ARIPIPRAZOLE 5 MG PO TABS
5.0000 mg | ORAL_TABLET | Freq: Every day | ORAL | 3 refills | Status: AC
  Filled 2023-09-10: qty 30, 30d supply, fill #0

## 2023-09-11 ENCOUNTER — Other Ambulatory Visit (HOSPITAL_COMMUNITY): Payer: Self-pay

## 2023-09-17 ENCOUNTER — Other Ambulatory Visit: Payer: Self-pay

## 2023-09-20 ENCOUNTER — Other Ambulatory Visit (HOSPITAL_COMMUNITY): Payer: Self-pay

## 2023-09-25 ENCOUNTER — Other Ambulatory Visit (HOSPITAL_COMMUNITY): Payer: Self-pay

## 2023-10-10 ENCOUNTER — Other Ambulatory Visit (HOSPITAL_COMMUNITY): Payer: Self-pay

## 2023-10-10 MED ORDER — CLONIDINE HCL 0.1 MG PO TABS
0.1000 mg | ORAL_TABLET | Freq: Every day | ORAL | 3 refills | Status: AC
  Filled 2023-10-10: qty 30, 30d supply, fill #0

## 2023-10-10 MED ORDER — AMPHETAMINE-DEXTROAMPHETAMINE 10 MG PO TABS
10.0000 mg | ORAL_TABLET | Freq: Every evening | ORAL | 0 refills | Status: DC | PRN
  Filled 2023-10-10: qty 20, 20d supply, fill #0

## 2023-10-10 MED ORDER — AMPHETAMINE-DEXTROAMPHET ER 30 MG PO CP24
30.0000 mg | ORAL_CAPSULE | Freq: Every morning | ORAL | 0 refills | Status: DC
  Filled 2023-10-10 – 2023-10-23 (×2): qty 30, 30d supply, fill #0

## 2023-10-17 ENCOUNTER — Ambulatory Visit: Attending: Cardiology | Admitting: Cardiology

## 2023-10-17 ENCOUNTER — Emergency Department (HOSPITAL_COMMUNITY)
Admission: EM | Admit: 2023-10-17 | Discharge: 2023-10-17 | Discharge status: Against Medical Advice | Attending: Emergency Medicine | Admitting: Emergency Medicine

## 2023-10-17 ENCOUNTER — Emergency Department
Admission: EM | Admit: 2023-10-17 | Discharge: 2023-10-17 | Disposition: A | Discharge status: Home / Self Care | Attending: Emergency Medicine | Admitting: Emergency Medicine

## 2023-10-17 ENCOUNTER — Emergency Department (HOSPITAL_COMMUNITY)

## 2023-10-17 ENCOUNTER — Encounter: Payer: Self-pay | Admitting: Emergency Medicine

## 2023-10-17 ENCOUNTER — Other Ambulatory Visit: Payer: Self-pay

## 2023-10-17 DIAGNOSIS — M25512 Pain in left shoulder: Secondary | ICD-10-CM | POA: Insufficient documentation

## 2023-10-17 DIAGNOSIS — R079 Chest pain, unspecified: Secondary | ICD-10-CM | POA: Diagnosis present

## 2023-10-17 DIAGNOSIS — M7989 Other specified soft tissue disorders: Secondary | ICD-10-CM | POA: Diagnosis not present

## 2023-10-17 DIAGNOSIS — R6884 Jaw pain: Secondary | ICD-10-CM | POA: Diagnosis not present

## 2023-10-17 DIAGNOSIS — R5383 Other fatigue: Secondary | ICD-10-CM | POA: Diagnosis not present

## 2023-10-17 DIAGNOSIS — R0789 Other chest pain: Secondary | ICD-10-CM | POA: Insufficient documentation

## 2023-10-17 DIAGNOSIS — Z5321 Procedure and treatment not carried out due to patient leaving prior to being seen by health care provider: Secondary | ICD-10-CM | POA: Insufficient documentation

## 2023-10-17 DIAGNOSIS — M2550 Pain in unspecified joint: Secondary | ICD-10-CM | POA: Insufficient documentation

## 2023-10-17 LAB — COMPREHENSIVE METABOLIC PANEL WITH GFR
ALT: 16 U/L (ref 0–44)
AST: 19 U/L (ref 15–41)
Albumin: 3.6 g/dL (ref 3.5–5.0)
Alkaline Phosphatase: 83 U/L (ref 38–126)
Anion gap: 8 (ref 5–15)
BUN: 13 mg/dL (ref 6–20)
CO2: 26 mmol/L (ref 22–32)
Calcium: 8.8 mg/dL — ABNORMAL LOW (ref 8.9–10.3)
Chloride: 105 mmol/L (ref 98–111)
Creatinine, Ser: 0.73 mg/dL (ref 0.44–1.00)
GFR, Estimated: >60 mL/min (ref >60)
Glucose, Bld: 85 mg/dL (ref 70–99)
Potassium: 4.2 mmol/L (ref 3.5–5.1)
Sodium: 139 mmol/L (ref 135–145)
Total Bilirubin: <0.2 mg/dL (ref 0.0–1.2)
Total Protein: 6.4 g/dL — ABNORMAL LOW (ref 6.5–8.1)

## 2023-10-17 LAB — CBC WITH DIFFERENTIAL/PLATELET
Abs Immature Granulocytes: 0.03 K/uL (ref 0.00–0.07)
Basophils Absolute: 0.1 K/uL (ref 0.0–0.1)
Basophils Relative: 1 %
Eosinophils Absolute: 0.2 K/uL (ref 0.0–0.5)
Eosinophils Relative: 2 %
HCT: 39.6 % (ref 36.0–46.0)
Hemoglobin: 13 g/dL (ref 12.0–15.0)
Immature Granulocytes: 0 %
Lymphocytes Relative: 37 %
Lymphs Abs: 3.3 K/uL (ref 0.7–4.0)
MCH: 28.9 pg (ref 26.0–34.0)
MCHC: 32.8 g/dL (ref 30.0–36.0)
MCV: 88 fL (ref 80.0–100.0)
Monocytes Absolute: 0.6 K/uL (ref 0.1–1.0)
Monocytes Relative: 7 %
Neutro Abs: 4.8 K/uL (ref 1.7–7.7)
Neutrophils Relative %: 53 %
Platelets: 286 K/uL (ref 150–400)
RBC: 4.5 MIL/uL (ref 3.87–5.11)
RDW: 12.4 % (ref 11.5–15.5)
WBC: 9.2 K/uL (ref 4.0–10.5)
nRBC: 0 % (ref 0.0–0.2)

## 2023-10-17 LAB — URINALYSIS, ROUTINE W REFLEX MICROSCOPIC
Bilirubin Urine: NEGATIVE
Bilirubin Urine: NEGATIVE
Glucose, UA: NEGATIVE mg/dL
Glucose, UA: NEGATIVE mg/dL
Hgb urine dipstick: NEGATIVE
Hgb urine dipstick: NEGATIVE
Ketones, ur: NEGATIVE mg/dL
Ketones, ur: NEGATIVE mg/dL
Leukocytes,Ua: NEGATIVE
Leukocytes,Ua: NEGATIVE
Nitrite: NEGATIVE
Nitrite: NEGATIVE
Protein, ur: NEGATIVE mg/dL
Protein, ur: NEGATIVE mg/dL
Specific Gravity, Urine: 1.02 (ref 1.005–1.030)
Specific Gravity, Urine: 1.026 (ref 1.005–1.030)
pH: 5 (ref 5.0–8.0)
pH: 5 (ref 5.0–8.0)

## 2023-10-17 LAB — TROPONIN I (HIGH SENSITIVITY)
Troponin I (High Sensitivity): 3 ng/L (ref <18)
Troponin I (High Sensitivity): 3 ng/L (ref <18)

## 2023-10-17 LAB — RESP PANEL BY RT-PCR (RSV, FLU A&B, COVID)  RVPGX2
Influenza A by PCR: NEGATIVE
Influenza B by PCR: NEGATIVE
Resp Syncytial Virus by PCR: NEGATIVE
SARS Coronavirus 2 by RT PCR: NEGATIVE

## 2023-10-17 LAB — POC URINE PREG, ED: Preg Test, Ur: NEGATIVE

## 2023-10-17 LAB — PREGNANCY, URINE: Preg Test, Ur: NEGATIVE

## 2023-10-17 MED ORDER — KETOROLAC TROMETHAMINE 30 MG/ML IJ SOLN
30.0000 mg | Freq: Once | INTRAMUSCULAR | Status: AC
  Administered 2023-10-17: 30 mg via INTRAMUSCULAR
  Filled 2023-10-17: qty 1

## 2023-10-17 NOTE — ED Provider Triage Note (Signed)
 Emergency Medicine Provider Triage Evaluation Note  Angelica Kim , a 33 y.o. female  was evaluated in triage.  Pt complains of chest pain and weakness since this morning at 9 AM.  Associated symptoms of joint pain and swelling for 1 day history of ankylosing spondylitis..  Review of Systems  Positive: Joint swelling bilaterally in hands and feet.  No rashes.  Nonreproducible chest pain on palpation or exertion. Negative: Shortness of breath, respiratory distress, dizziness, syncope, or focal deficits  Physical Exam  BP 117/75 (BP Location: Right Arm)   Pulse 91   Temp 98.3 F (36.8 C)   Resp 20   Ht 5' (1.524 m)   Wt 72.6 kg   LMP 10/12/2023   SpO2 98%   BMI 31.25 kg/m  Gen:   Awake, no distress   Resp:  Normal effort and lungs are clear to auscultation in all fields MSK:   Moves extremities without difficulty full range of motion in all extremities and patient is able to ambulate without difficulty.  Patient has obvious swellings on bilateral hands and bilateral feet.  No obvious rashes. Other:  Nonreproducible chest pain on palpation or inspiration.  Patient reports pain is constant dull and radiates to her left shoulder and back.  Medical Decision Making  Medically screening exam initiated at 1:12 PM.  Appropriate orders placed.  Angelica Kim was informed that the remainder of the evaluation will be completed by another provider, this initial triage assessment does not replace that evaluation, and the importance of remaining in the ED until their evaluation is complete.  Patient reports with history of weakness x 1 day.  With associated chest pain.  Patient was tried to get out of bed this morning and reports she was feeling weak and took her over an hour to get out of bed.  Patient has significant history of ADHD treated with Adderall and ankylosing spondylitis diagnosed when she was a child.  Patient has nonreproducible chest pain with palpation or deep inspiration.  No  obvious shortness of breath.  Initial plan is to rule out cardiac etiology.   Angelica Kim RAMAN, [place] 10/17/23 1315

## 2023-10-17 NOTE — ED Notes (Signed)
Urine sample collected and sent to lab at this time.

## 2023-10-17 NOTE — ED Notes (Signed)
 Pt leaving the ER AMA states that she is heading to [facility]

## 2023-10-17 NOTE — ED Provider Notes (Signed)
 [facility] Provider Note    Event Date/Time   First MD Initiated Contact with Patient 10/17/23 2031     (approximate)   History   Chief Complaint Chest Pain, Jaw Pain, and Flank Pain   HPI  Kenosha Doster is a 33 y.o. female with past medical history of anxiety, migraines, and fibromyalgia who presents to the ED complaining of chest pain.  Patient reports that she woke up this morning dealing with tightness and pain in her chest with intermittent sharp discomfort radiating towards her back as well as her jaw.  This has been associated with generalized fatigue as well as pain in multiple joints of her arms and legs.  She is not aware of any fevers and denies any cough or difficulty breathing.  She does state that her urine has seemed foamy, but she denies any dysuria or hematuria.  She does state that her son was recently diagnosed with pneumonia, but she is not sure whether they were tested for COVID or flu.  She has not had any abdominal pain, dysuria, nausea, vomiting, or diarrhea.     Physical Exam   Triage Vital Signs: ED Triage Vitals  Encounter Vitals Group     BP 10/17/23 1919 120/80     Girls Systolic BP Percentile --      Girls Diastolic BP Percentile --      Boys Systolic BP Percentile --      Boys Diastolic BP Percentile --      Pulse Rate 10/17/23 1919 97     Resp 10/17/23 1919 18     Temp 10/17/23 1919 98 F (36.7 C)     Temp src --      SpO2 10/17/23 1919 100 %     Weight --      Height --      Head Circumference --      Peak Flow --      Pain Score 10/17/23 1920 6     Pain Loc --      Pain Education --      Exclude from Growth Chart --     Most recent vital signs: Vitals:   10/17/23 1919  BP: 120/80  Pulse: 97  Resp: 18  Temp: 98 F (36.7 C)  SpO2: 100%    Constitutional: Alert and oriented. Eyes: Conjunctivae are normal. Head: Atraumatic. Nose: No congestion/rhinnorhea. Mouth/Throat: Mucous membranes are  moist.  Cardiovascular: Normal rate, regular rhythm. Grossly normal heart sounds.  2+ radial pulses bilaterally. Respiratory: Normal respiratory effort.  No retractions. Lungs CTAB. Gastrointestinal: Soft and nontender. No distention. Musculoskeletal: No lower extremity tenderness nor edema.  No upper extremity bony tenderness to palpation.  No erythema or edema of the joints of her extremities. Neurologic:  Normal speech and language. No gross focal neurologic deficits are appreciated.    ED Results / Procedures / Treatments   Labs (all labs ordered are listed, but only abnormal results are displayed) Labs Reviewed  URINALYSIS, ROUTINE W REFLEX MICROSCOPIC - Abnormal; Notable for the following components:      Result Value   Color, Urine YELLOW (*)    APPearance HAZY (*)    All other components within normal limits  RESP PANEL BY RT-PCR (RSV, FLU A&B, COVID)  [id number]  POC URINE PREG, ED     EKG  ED ECG REPORT I, Carlin Palin, the attending physician, personally viewed and interpreted this ECG.   Date: 10/17/2023  EKG Time: 19:14  Rate: 92  Rhythm: normal sinus rhythm  Axis: Normal  Intervals:none  ST&T Change: None   PROCEDURES:  Critical Care performed: No  Procedures   MEDICATIONS ORDERED IN ED: Medications  ketorolac  (TORADOL ) 30 MG/ML injection 30 mg (30 mg Intramuscular Given 10/17/23 2101)     IMPRESSION / MDM / ASSESSMENT AND PLAN / ED COURSE  I reviewed the triage vital signs and the nursing notes.                              33 y.o. female with past medical history of anxiety, migraines, and fibromyalgia who presents to the ED complaining of chest tightness and discomfort rating towards her back and jaw since this morning as well as soreness in multiple joints and generalized fatigue.  Patient's presentation is most consistent with acute presentation with potential threat to life or bodily function.  Differential diagnosis includes, but is not  limited to, ACS, PE, pneumonia, pneumothorax, musculoskeletal pain, GERD, anxiety, viral syndrome, COVID-19, rheumatologic process.  Patient nontoxic-appearing and in no acute distress, vital signs are unremarkable.  Patient initially seen in the [facility] ED, where workup was unremarkable, including 2 sets of troponin.  Chest x-ray and additional labs were also unremarkable at that time.  EKG repeated here in the ED and continues to show no evidence of arrhythmia or ischemia.  Symptoms seem atypical for ACS and I doubt PE as patient is PERC negative.  Symptoms sound more consistent with viral syndrome, will test for COVID and flu.  Patient also reports possible history of ankylosing spondylitis and would consider rheumatologic process with her joint pain.  She was provided with referral to rheumatology, counseled to return to the ED for new or worsening symptoms.  Patient agrees with plan.      FINAL CLINICAL IMPRESSION(S) / ED DIAGNOSES   Final diagnoses:  Atypical chest pain  Pain in joint, multiple sites     Rx / DC Orders   ED Discharge Orders     None        Note:  This document was prepared using Dragon voice recognition software and may include unintentional dictation errors.   Willo Dunnings, MD 10/17/23 2136

## 2023-10-17 NOTE — ED Triage Notes (Signed)
 Pt. Stated, I got up this morning with chest pain, jaw pain which I have 2 bad teeth that are scheduled to come out. My chest pain has gone into my left shoulder and to the back shoulder blade. Ive also had swellinf in my feet and hands.

## 2023-10-17 NOTE — ED Triage Notes (Signed)
 Pt arrives POV from [facility] where she was seen for chest pain and jaw pain.. pt states she was there in waiting room 8 hours, left and came here due to long wait. Pt states her CP began this morning described as tightness, she now states the chest pain is intermittently sharp w/ radiation to back and right sided jaw pain... pt also reports left shoulder pain and left flank pain that started today, she states her urine has been foamy the past couple days.... reports swelling in hands and feet that started this morning. Denies sob or dizziness. NADN.

## 2023-10-17 NOTE — ED Notes (Signed)
 Rn assessed pt after pt complained of a change in her chest pain . Pt vitals were obtained and found to be in normal limits

## 2023-10-18 ENCOUNTER — Ambulatory Visit: Admitting: Internal Medicine

## 2023-10-22 ENCOUNTER — Ambulatory Visit (INDEPENDENT_AMBULATORY_CARE_PROVIDER_SITE_OTHER)

## 2023-10-22 ENCOUNTER — Encounter: Payer: Self-pay | Admitting: Internal Medicine

## 2023-10-22 ENCOUNTER — Ambulatory Visit: Attending: Internal Medicine | Admitting: Internal Medicine

## 2023-10-22 VITALS — BP 112/69 | HR 80 | Temp 97.0°F | Resp 16 | Ht 61.0 in | Wt 163.6 lb

## 2023-10-22 DIAGNOSIS — M4690 Unspecified inflammatory spondylopathy, site unspecified: Secondary | ICD-10-CM

## 2023-10-22 DIAGNOSIS — M797 Fibromyalgia: Secondary | ICD-10-CM

## 2023-10-22 DIAGNOSIS — M2550 Pain in unspecified joint: Secondary | ICD-10-CM | POA: Insufficient documentation

## 2023-10-22 DIAGNOSIS — R82998 Other abnormal findings in urine: Secondary | ICD-10-CM | POA: Insufficient documentation

## 2023-10-22 MED ORDER — CELECOXIB 200 MG PO CAPS: 200.0000 mg | ORAL_CAPSULE | Freq: Two times a day (BID) | ORAL | 2 refills | Status: DC | PRN
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
## 2023-10-23 ENCOUNTER — Other Ambulatory Visit (HOSPITAL_COMMUNITY): Payer: Self-pay

## 2023-10-23 LAB — PROTEIN / CREATININE RATIO, URINE
Creatinine, Urine: 240 mg/dL (ref 20–275)
Protein/Creat Ratio: 63 mg/g{creat} (ref 24–184)
Protein/Creatinine Ratio: 0.063 mg/mg{creat} (ref 0.024–0.184)
Total Protein, Urine: 15 mg/dL (ref 5–24)

## 2023-10-23 LAB — CYCLIC CITRUL PEPTIDE ANTIBODY, IGG: Cyclic Citrullin Peptide Ab: <16 U

## 2023-10-23 LAB — SEDIMENTATION RATE: Sed Rate: 2 mm/h (ref 0–20)

## 2023-10-23 LAB — C-REACTIVE PROTEIN: CRP: <3 mg/L (ref <8.0)

## 2023-10-23 LAB — RHEUMATOID FACTOR: Rheumatoid fact SerPl-aCnc: <10 [IU]/mL (ref <14)

## 2023-11-11 ENCOUNTER — Ambulatory Visit (HOSPITAL_COMMUNITY)
Admission: EM | Admit: 2023-11-11 | Discharge: 2023-11-11 | Disposition: A | Discharge status: Home / Self Care | Attending: Family Medicine | Admitting: Family Medicine

## 2023-11-11 ENCOUNTER — Encounter (HOSPITAL_COMMUNITY): Payer: Self-pay | Admitting: *Deleted

## 2023-11-11 DIAGNOSIS — S0990XA Unspecified injury of head, initial encounter: Secondary | ICD-10-CM

## 2023-11-11 DIAGNOSIS — R519 Headache, unspecified: Secondary | ICD-10-CM | POA: Diagnosis not present

## 2023-11-11 MED ORDER — IBUPROFEN 800 MG PO TABS: 800.0000 mg | ORAL_TABLET | Freq: Three times a day (TID) | ORAL | 0 refills | Status: AC | PRN
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
## 2023-11-11 NOTE — Discharge Instructions (Addendum)
 Your exam is normal today except for some tenderness on both sides of your head.  Take ibuprofen  800 mg--1 tab every 8 hours as needed for pain.  Ice could help
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
## 2023-11-13 ENCOUNTER — Encounter (INDEPENDENT_AMBULATORY_CARE_PROVIDER_SITE_OTHER): Payer: Self-pay | Admitting: Gastroenterology

## 2023-11-27 ENCOUNTER — Other Ambulatory Visit (HOSPITAL_COMMUNITY): Payer: Self-pay

## 2023-11-27 MED ORDER — AMPHETAMINE-DEXTROAMPHET ER 30 MG PO CP24: 30.0000 mg | ORAL_CAPSULE | Freq: Every morning | ORAL | 0 refills | Status: AC

## 2023-11-27 MED ORDER — AMPHETAMINE-DEXTROAMPHETAMINE 10 MG PO TABS
10.0000 mg | ORAL_TABLET | Freq: Every evening | ORAL | 0 refills | Status: AC | PRN
  Filled 2023-11-27: qty 20, 20d supply, fill #0

## 2023-12-04 ENCOUNTER — Encounter: Payer: Self-pay | Admitting: Cardiology

## 2023-12-06 ENCOUNTER — Other Ambulatory Visit (HOSPITAL_COMMUNITY): Payer: Self-pay

## 2023-12-17 ENCOUNTER — Emergency Department (HOSPITAL_BASED_OUTPATIENT_CLINIC_OR_DEPARTMENT_OTHER)
Admission: EM | Admit: 2023-12-17 | Discharge: 2023-12-17 | Disposition: A | Discharge status: Home / Self Care | Attending: Emergency Medicine | Admitting: Emergency Medicine

## 2023-12-17 ENCOUNTER — Encounter (HOSPITAL_BASED_OUTPATIENT_CLINIC_OR_DEPARTMENT_OTHER): Payer: Self-pay

## 2023-12-17 ENCOUNTER — Other Ambulatory Visit: Payer: Self-pay

## 2023-12-17 DIAGNOSIS — J029 Acute pharyngitis, unspecified: Secondary | ICD-10-CM | POA: Insufficient documentation

## 2023-12-17 DIAGNOSIS — H6692 Otitis media, unspecified, left ear: Secondary | ICD-10-CM | POA: Insufficient documentation

## 2023-12-17 DIAGNOSIS — H6690 Otitis media, unspecified, unspecified ear: Secondary | ICD-10-CM

## 2023-12-17 DIAGNOSIS — H9202 Otalgia, left ear: Secondary | ICD-10-CM | POA: Diagnosis present

## 2023-12-17 LAB — RESP PANEL BY RT-PCR (RSV, FLU A&B, COVID)  RVPGX2
Influenza A by PCR: NEGATIVE
Influenza B by PCR: NEGATIVE
Resp Syncytial Virus by PCR: NEGATIVE
SARS Coronavirus 2 by RT PCR: NEGATIVE

## 2023-12-17 LAB — GROUP A STREP BY PCR: Group A Strep by PCR: NOT DETECTED

## 2023-12-17 MED ORDER — KETOROLAC TROMETHAMINE 15 MG/ML IJ SOLN
15.0000 mg | Freq: Once | INTRAMUSCULAR | Status: AC
  Administered 2023-12-17: 15 mg via INTRAMUSCULAR
  Filled 2023-12-17: qty 1

## 2023-12-17 MED ORDER — AZITHROMYCIN 250 MG PO TABS: 250.0000 mg | ORAL_TABLET | Freq: Every day | ORAL | 0 refills | Status: AC

## 2023-12-17 NOTE — ED Triage Notes (Signed)
 Onset last night of left ear pain developed into not hearing from that ear.  Pain now radiating down left neck and jaw

## 2023-12-17 NOTE — ED Provider Notes (Signed)
 [place] EMERGENCY DEPARTMENT AT [facility] Provider Note   CSN: [id number] Arrival date & time: 12/17/23  9086     Patient presents with: Otalgia   Angelica Kim is a 33 y.o. female with a past medical history significant for fibromyalgia and history of migraines who presents to the ED due to left otalgia.  Patient states pain radiates to her left jaw and neck.  Denies any dental pain.  Endorses a slight sore throat and nasal congestion.  Children sick with similar symptoms.  Denies chest pain and shortness of breath.  Denies rash.  Notes her hearing is slightly muffled on the left compared to the right. Symptoms started last night.   History obtained from patient and past medical records. No interpreter used during encounter.       Prior to Admission medications   Medication Sig Start Date End Date Taking? Authorizing Provider  azithromycin  (ZITHROMAX ) 250 MG tablet Take 1 tablet (250 mg total) by mouth daily. Take first 2 tablets together, then 1 every day until finished. 12/17/23  Yes Fred Franzen, Aleck BROCKS, PA-C  albuterol  (PROVENTIL ) (2.5 MG/3ML) 0.083% nebulizer solution Take 3 mLs (2.5 mg total) by nebulization every 6 (six) hours as needed for wheezing or shortness of breath. 02/23/21 10/22/23  Joesph Shaver Scales, PA-C  albuterol  (VENTOLIN  HFA) 108 (90 Base) MCG/ACT inhaler Inhale 2 puffs into the lungs every 6 (six) hours as needed for wheezing or shortness of breath. 10/30/21   Lynwood Lenis, PA-C  Amphetamine  ER (DYANAVEL  XR) 10 MG TBCR Take 1 tablet (10 mg) by mouth in the morning. 07/08/23     amphetamine -dextroamphetamine  (ADDERALL XR) 20 MG 24 hr capsule Take 1 capsule (20 mg total) by mouth every morning. 07/10/23     amphetamine -dextroamphetamine  (ADDERALL XR) 30 MG 24 hr capsule Take 1 capsule (30 mg total) by mouth every morning. 11/27/23     amphetamine -dextroamphetamine  (ADDERALL) 10 MG tablet Take 1 tablet (10 mg total) by mouth at bedtime as needed.  11/27/23     amphetamine -dextroamphetamine  (ADDERALL) 5 MG tablet Take 1 tablet (5 mg total) by mouth at bedtime as needed. Patient not taking: No sig reported 07/08/23     ARIPiprazole  (ABILIFY ) 5 MG tablet Take 1 tablet (5 mg total) by mouth at bedtime. 09/10/23     budesonide-formoterol (SYMBICORT) 160-4.5 MCG/ACT inhaler Inhale 2 puffs into the lungs daily. Patient not taking: No sig reported    [provider]  butalbital -acetaminophen -caffeine  (FIORICET ) 50-325-40 MG tablet Take 1-2 tablets by mouth 2 (two) times daily as needed for headache.    [provider]  CHOLINE PO Take 110 mg by mouth daily. Patient not taking: No sig reported    [provider]  clindamycin  (CLEOCIN ) 300 MG capsule Take 1 capsule (300 mg total) by mouth 4 (four) times daily. X 7 days Patient not taking: No sig reported 09/07/23   Suzette Pac, MD  cloNIDine  (CATAPRES ) 0.1 MG tablet Take 1 tablet (0.1 mg total) by mouth at bedtime. 10/10/23     enalapril  (VASOTEC ) 10 MG tablet Take 1 tablet (10 mg total) by mouth daily. Patient not taking: Reported on 04/08/2023 03/02/23   Grice, Vivian B, CNM  ferrous sulfate  325 (65 FE) MG tablet Take 1 tablet (325 mg total) by mouth every other day. Patient not taking: Reported on 04/08/2023 08/05/20   Holshouser, Suzen NOVAK, CNM  FLUoxetine  (PROZAC ) 10 MG capsule Take 1 capsule (10 mg total) by mouth every morning. 06/10/23  FLUoxetine  (PROZAC ) 20 MG capsule Take 1 capsule (20 mg total) by mouth every morning. 06/10/23     FLUoxetine  (PROZAC ) 20 MG capsule Take 1 capsule (20 mg total) by mouth every morning with 40mg  capsule for total dose of 60mg . 08/06/23     FLUoxetine  (PROZAC ) 20 MG capsule Take 1 capsule (20 mg total) by mouth every morning. 09/10/23     FLUoxetine  (PROZAC ) 40 MG capsule Take 1 capsule (40 mg total) by mouth in the morning. 07/08/23     FLUoxetine  (PROZAC ) 40 MG capsule Take 1 capsule (40 mg total) by mouth every morning. 09/10/23      ibuprofen  (ADVIL ) 800 MG tablet Take 1 tablet (800 mg total) by mouth every 8 (eight) hours as needed (pain). 11/11/23   Banister, Pamela K, MD  lisdexamfetamine (VYVANSE ) 30 MG capsule Take 1 capsule (30 mg total) by mouth in the morning. Patient not taking: No sig reported 06/16/23     Magnesium  200 MG CHEW Chew 400 mg by mouth daily. Patient not taking: No sig reported    [provider]  mometasone (NASONEX) 50 MCG/ACT nasal spray Place 1 spray into the nose daily. Patient not taking: No sig reported    [provider]  ondansetron  (ZOFRAN -ODT) 4 MG disintegrating tablet Take 1 tablet (4 mg total) by mouth every 8 (eight) hours as needed for nausea or vomiting. Patient not taking: No sig reported 08/21/23   Arlander Charleston, MD  oxyCODONE -acetaminophen  (PERCOCET/ROXICET) 5-325 MG tablet Take 1 every 6-8 hours for pain that is not relieved by Motrin  or Tylenol  alone Patient not taking: No sig reported 09/07/23   Suzette Pac, MD  pantoprazole  (PROTONIX ) 40 MG tablet Take 1 tablet (40 mg total) by mouth daily. Patient not taking: No sig reported 08/21/23 08/20/24  Arlander Charleston, MD  Prenatal Vit-DSS-Fe Cbn-FA (PRENATAL ADVANTAGE PO) Take 1 tablet by mouth daily. Patient not taking: No sig reported    [provider]  promethazine  (PHENERGAN ) 25 MG tablet Take 1 tablet (25 mg total) by mouth every 6 (six) hours as needed for nausea or vomiting. 08/01/23   Zelaya, Oscar A, PA-C    Allergies: Amoxicillin    Review of Systems  Constitutional:  Negative for chills and fever.  HENT:  Positive for congestion, ear pain and sore throat.   Respiratory:  Negative for shortness of breath.   Cardiovascular:  Negative for chest pain.    Updated Vital Signs BP 110/75 (BP Location: Right Arm)   Pulse 100   Temp 98.4 F (36.9 C)   Resp 16   Wt 72.6 kg   LMP 12/06/2023 (Exact Date)   SpO2 100%   BMI 30.23 kg/m   Physical Exam Vitals and nursing note reviewed.   Constitutional:      General: She is not in acute distress.    Appearance: She is not ill-appearing.  HENT:     Head: Normocephalic.     Right Ear: Tympanic membrane normal.     Ears:     Comments: Slight fluid behind left TM without perforation.     Mouth/Throat:     Comments: Slight erythema. Uvula midline. No abscess. No tonsillar hypertrophy.  Eyes:     Pupils: Pupils are equal, round, and reactive to light.  Cardiovascular:     Rate and Rhythm: Normal rate and regular rhythm.     Pulses: Normal pulses.     Heart sounds: Normal heart sounds. No murmur heard.    No friction rub.  No gallop.  Pulmonary:     Effort: Pulmonary effort is normal.     Breath sounds: Normal breath sounds.  Abdominal:     General: Abdomen is flat. There is no distension.     Palpations: Abdomen is soft.     Tenderness: There is no abdominal tenderness. There is no guarding or rebound.  Musculoskeletal:        General: Normal range of motion.     Cervical back: Neck supple.  Skin:    General: Skin is warm and dry.  Neurological:     General: No focal deficit present.     Mental Status: She is alert.  Psychiatric:        Mood and Affect: Mood normal.        Behavior: Behavior normal.     (all labs ordered are listed, but only abnormal results are displayed) Labs Reviewed  RESP PANEL BY RT-PCR (RSV, FLU A&B, COVID)  [id number]  GROUP A STREP BY PCR    EKG: None  Radiology: No results found.   Procedures   Medications Ordered in the ED  ketorolac  (TORADOL ) 15 MG/ML injection 15 mg (15 mg Intramuscular Given 12/17/23 1000)    Clinical Course as of 12/17/23 1141  Tue Dec 17, 2023  1108 Group A Strep by PCR: NOT DETECTED [CA]    Clinical Course User Index [CA] Lorelle Aleck BROCKS, PA-C                                 Medical Decision Making Amount and/or Complexity of Data Reviewed Labs:  Decision-making details documented in ED Course.  Risk Prescription drug  management.   This patient presents to the ED for concern of otalgia, this involves an extensive number of treatment options, and is a complaint that carries with it a high risk of complications and morbidity.  The differential diagnosis includes AOM, shingles, TM perforation, dental infection, etc  33 year old female presents to the ED due to left otalgia associated with nasal congestion and sore throat.  Children sick with similar symptoms.  Pain radiates to left jaw and neck.  Denies chest pain and shortness of breath.  Upon arrival, vitals all within normal limits.  Patient well-appearing on exam.  Does have slight fluid behind left TM.  Right TM unremarkable.  No dental pain or abscess on exam.  Throat with mild erythema without tonsillar hypertrophy or exudates.  RVP and strep test ordered.  Will treat for acute otitis media.  No evidence of perforation on exam.  No meningismus to suggest meningitis. Low suspicion for cardiac etiology of pain.   RVP negative.  Strep negative.  Patient given azithromycin  for acute otitis media given severe allergy to amoxicillin.  ENT number given to patient at discharge and advised to call to schedule an appointment if symptoms do not improve over the next few days.  Upon reassessment, patient admits to improvement in otalgia after Toradol .  No evidence of mastoiditis on exam.  No rash to suggest shingles.  Patient stable for discharge. Strict ED precautions discussed with patient. Patient states understanding and agrees to plan. Patient discharged home in no acute distress and stable vitals  No PCP Hx fibromyalgia    Final diagnoses:  Acute otitis media, unspecified otitis media type    ED Discharge Orders          Ordered    azithromycin  (ZITHROMAX ) 250 MG tablet  Daily        12/17/23 1136               Lorelle Aleck JAYSON DEVONNA 12/17/23 1155    Geraldene Hamilton, MD 12/19/23 1024

## 2023-12-17 NOTE — Discharge Instructions (Addendum)
 It was a pleasure taking care of you today.  As discussed, I am treating you for an ear infection.  Take antibiotics as prescribed and finish all antibiotics.  I have included the number of the ear nose and throat doctor.  Please call to schedule an appointment if symptoms do not improve over the next few days.  You may take over-the-counter ibuprofen  or Tylenol  as needed for pain.  Return to the ER for new or worsening symptoms.

## 2023-12-18 ENCOUNTER — Encounter (HOSPITAL_BASED_OUTPATIENT_CLINIC_OR_DEPARTMENT_OTHER): Payer: Self-pay | Admitting: *Deleted

## 2023-12-18 ENCOUNTER — Other Ambulatory Visit: Payer: Self-pay

## 2023-12-18 ENCOUNTER — Emergency Department (HOSPITAL_BASED_OUTPATIENT_CLINIC_OR_DEPARTMENT_OTHER)
Admission: EM | Admit: 2023-12-18 | Discharge: 2023-12-18 | Disposition: A | Discharge status: Home / Self Care | Attending: Emergency Medicine | Admitting: Emergency Medicine

## 2023-12-18 DIAGNOSIS — H81392 Other peripheral vertigo, left ear: Secondary | ICD-10-CM | POA: Diagnosis not present

## 2023-12-18 DIAGNOSIS — Z79899 Other long term (current) drug therapy: Secondary | ICD-10-CM | POA: Diagnosis not present

## 2023-12-18 DIAGNOSIS — H66002 Acute suppurative otitis media without spontaneous rupture of ear drum, left ear: Secondary | ICD-10-CM | POA: Diagnosis not present

## 2023-12-18 DIAGNOSIS — H9202 Otalgia, left ear: Secondary | ICD-10-CM | POA: Diagnosis present

## 2023-12-18 MED ORDER — DIAZEPAM 2 MG PO TABS
2.0000 mg | ORAL_TABLET | Freq: Once | ORAL | Status: AC
  Administered 2023-12-18: 2 mg via ORAL
  Filled 2023-12-18: qty 1

## 2023-12-18 MED ORDER — OXYCODONE HCL 5 MG PO TABS
2.5000 mg | ORAL_TABLET | Freq: Four times a day (QID) | ORAL | 0 refills | Status: AC | PRN
Start: 2023-12-18 — End: ?

## 2023-12-18 MED ORDER — DIAZEPAM 2 MG PO TABS: 2.0000 mg | ORAL_TABLET | Freq: Four times a day (QID) | ORAL | 0 refills | Status: AC | PRN

## 2023-12-18 MED ORDER — KETOROLAC TROMETHAMINE 60 MG/2ML IM SOLN
60.0000 mg | Freq: Once | INTRAMUSCULAR | Status: AC
  Administered 2023-12-18: 60 mg via INTRAMUSCULAR
  Filled 2023-12-18: qty 2

## 2023-12-18 MED ORDER — CEFDINIR 300 MG PO CAPS: 300.0000 mg | ORAL_CAPSULE | Freq: Two times a day (BID) | ORAL | 0 refills | Status: AC

## 2023-12-18 MED ORDER — MECLIZINE HCL 25 MG PO TABS
12.5000 mg | ORAL_TABLET | Freq: Once | ORAL | Status: AC
  Administered 2023-12-18: 12.5 mg via ORAL
  Filled 2023-12-18: qty 1

## 2023-12-18 MED ORDER — METHYLPREDNISOLONE 4 MG PO TBPK: ORAL_TABLET | ORAL | 0 refills | Status: AC

## 2023-12-18 MED ORDER — DEXAMETHASONE SOD PHOSPHATE PF 10 MG/ML IJ SOLN
10.0000 mg | Freq: Once | INTRAMUSCULAR | Status: AC
  Administered 2023-12-18: 10 mg via INTRAMUSCULAR

## 2023-12-18 MED ORDER — MECLIZINE HCL 25 MG PO TABS: 25.0000 mg | ORAL_TABLET | Freq: Three times a day (TID) | ORAL | 0 refills | Status: AC | PRN

## 2023-12-18 NOTE — ED Notes (Signed)

## 2023-12-18 NOTE — Discharge Instructions (Addendum)
 I am discharging you with several medications. Oxycodone  is for severe uncontrolled pain.  If you are able to take Tylenol  or other anti-inflammatories for pain control without taking this please do so first. I am also discharging you with Valium and meclizine.  Both of these are for your significant vertigo.  If the meclizine works well for you you do not need to take Valium however if you are room spinning dizziness and inability to walk returns and is not controlled by the meclizine you may take the Valium. Both Valium and oxycodone  are controlled substances and can make you very sleepy.  They can also cause respiratory sedation.  You should make sure that you are taking these 2 medications separately at least 6 hours apart from 1 another.  Also do not drive while taking either of these medications.   Contact a health care provider if: You have bleeding from your nose. There is a lump on your neck. You are not feeling better in 5 days. You feel worse instead of better. Get help right away if: You have severe pain that is not controlled with medicine. You have swelling, redness, or pain around your ear. You have stiffness in your neck. A part of your face is not moving (paralyzed). The bone behind your ear (mastoid bone) is tender when you touch it. You develop a severe headache.

## 2023-12-18 NOTE — ED Triage Notes (Addendum)
 Here by POV from home for L ear pain, onset 2d ago, seen here yesterday for the same, prescribed tylenol , ibuprofen  and z-pack. Woke this am with dizziness and altered / equilibrium/ balance. Also endorses nasal congestion and sore throat. Worse with movement and bending over. Denies fever. Describes pain as severe. Last ibuprofen  800mg  at 0800.

## 2023-12-18 NOTE — ED Provider Notes (Signed)
 [place] EMERGENCY DEPARTMENT AT [facility] Provider Note   CSN: [id number] Arrival date & time: 12/18/23  9084     Patient presents with: Otalgia   Angelica Kim is a 33 y.o. female who presents emergency department chief complaint of left ear pain and vertigo.  Patient was seen yesterday for left ear pain and diagnosed with acute otitis media.  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  She reports that she was given a Z-Pak, Tylenol  and overnight had significant progressive worsening in both pain in her ear as well as woke up with ataxia and room spinning dizziness which is worse when she moves her head or stands.  She denies nausea or vomiting but had to get her mother to bring her here because she was so off balance.  She rates the pain as 10 out of 10 in her ear.  She has pain that is occasionally shooting and sharp.    Otalgia      Prior to Admission medications   Medication Sig Start Date End Date Taking? Authorizing Provider  albuterol  (PROVENTIL ) (2.5 MG/3ML) 0.083% nebulizer solution Take 3 mLs (2.5 mg total) by nebulization every 6 (six) hours as needed for wheezing or shortness of breath. 02/23/21 10/22/23  Joesph Shaver Scales, PA-C  albuterol  (VENTOLIN  HFA) 108 (90 Base) MCG/ACT inhaler Inhale 2 puffs into the lungs every 6 (six) hours as needed for wheezing or shortness of breath. 10/30/21   Lynwood Lenis, PA-C  Amphetamine  ER (DYANAVEL  XR) 10 MG TBCR Take 1 tablet (10 mg) by mouth in the morning. 07/08/23     amphetamine -dextroamphetamine  (ADDERALL XR) 20 MG 24 hr capsule Take 1 capsule (20 mg total) by mouth every morning. 07/10/23     amphetamine -dextroamphetamine  (ADDERALL XR) 30 MG 24 hr capsule Take 1 capsule (30 mg total) by mouth every morning. 11/27/23     amphetamine -dextroamphetamine  (ADDERALL) 10 MG tablet Take 1 tablet (10 mg total) by mouth at bedtime as needed. 11/27/23      amphetamine -dextroamphetamine  (ADDERALL) 5 MG tablet Take 1 tablet (5 mg total) by mouth at bedtime as needed. Patient not taking: No sig reported 07/08/23     ARIPiprazole  (ABILIFY ) 5 MG tablet Take 1 tablet (5 mg total) by mouth at bedtime. 09/10/23     azithromycin  (ZITHROMAX ) 250 MG tablet Take 1 tablet (250 mg total) by mouth daily. Take first 2 tablets together, then 1 every day until finished. 12/17/23   Aberman, Caroline C, PA-C  budesonide-formoterol (SYMBICORT) 160-4.5 MCG/ACT inhaler Inhale 2 puffs into the lungs daily. Patient not taking: No sig reported    [provider]  butalbital -acetaminophen -caffeine  (FIORICET ) 50-325-40 MG tablet Take 1-2 tablets by mouth 2 (two) times daily as needed for headache.    [provider]  CHOLINE PO Take 110 mg by mouth daily. Patient not taking: No sig reported    [provider]  clindamycin  (CLEOCIN ) 300 MG capsule Take 1 capsule (300 mg total) by mouth 4 (four) times daily. X 7 days Patient not taking: No sig reported 09/07/23   Suzette Pac, MD  cloNIDine  (CATAPRES ) 0.1 MG tablet Take 1 tablet (0.1 mg total) by mouth at bedtime. 10/10/23     enalapril  (VASOTEC ) 10 MG tablet Take 1 tablet (10 mg total) by mouth daily. Patient not taking: Reported on 04/08/2023 03/02/23   Grice, Vivian B, CNM  ferrous sulfate  325 (65 FE) MG tablet Take 1 tablet (325  mg total) by mouth every other day. Patient not taking: Reported on 04/08/2023 08/05/20   Holshouser, Suzen NOVAK, CNM  FLUoxetine  (PROZAC ) 10 MG capsule Take 1 capsule (10 mg total) by mouth every morning. 06/10/23     FLUoxetine  (PROZAC ) 20 MG capsule Take 1 capsule (20 mg total) by mouth every morning. 06/10/23     FLUoxetine  (PROZAC ) 20 MG capsule Take 1 capsule (20 mg total) by mouth every morning with 40mg  capsule for total dose of 60mg . 08/06/23     FLUoxetine  (PROZAC ) 20 MG capsule Take 1 capsule (20 mg total) by mouth every morning. 09/10/23     FLUoxetine  (PROZAC ) 40 MG  capsule Take 1 capsule (40 mg total) by mouth in the morning. 07/08/23     FLUoxetine  (PROZAC ) 40 MG capsule Take 1 capsule (40 mg total) by mouth every morning. 09/10/23     ibuprofen  (ADVIL ) 800 MG tablet Take 1 tablet (800 mg total) by mouth every 8 (eight) hours as needed (pain). 11/11/23   Banister, Pamela K, MD  lisdexamfetamine (VYVANSE ) 30 MG capsule Take 1 capsule (30 mg total) by mouth in the morning. Patient not taking: No sig reported 06/16/23     Magnesium  200 MG CHEW Chew 400 mg by mouth daily. Patient not taking: No sig reported    [provider]  mometasone (NASONEX) 50 MCG/ACT nasal spray Place 1 spray into the nose daily. Patient not taking: No sig reported    [provider]  ondansetron  (ZOFRAN -ODT) 4 MG disintegrating tablet Take 1 tablet (4 mg total) by mouth every 8 (eight) hours as needed for nausea or vomiting. Patient not taking: No sig reported 08/21/23   Arlander Charleston, MD  oxyCODONE -acetaminophen  (PERCOCET/ROXICET) 5-325 MG tablet Take 1 every 6-8 hours for pain that is not relieved by Motrin  or Tylenol  alone Patient not taking: No sig reported 09/07/23   Suzette Pac, MD  pantoprazole  (PROTONIX ) 40 MG tablet Take 1 tablet (40 mg total) by mouth daily. Patient not taking: No sig reported 08/21/23 08/20/24  Arlander Charleston, MD  Prenatal Vit-DSS-Fe Cbn-FA (PRENATAL ADVANTAGE PO) Take 1 tablet by mouth daily. Patient not taking: No sig reported    [provider]  promethazine  (PHENERGAN ) 25 MG tablet Take 1 tablet (25 mg total) by mouth every 6 (six) hours as needed for nausea or vomiting. 08/01/23   Zelaya, Oscar A, PA-C    Allergies: Amoxicillin    Review of Systems  HENT:  Positive for ear pain.     Updated Vital Signs BP 124/75 (BP Location: Right Arm)   Pulse (!) 110   Temp 98.3 F (36.8 C) (Oral)   Resp 17   Ht 5' 1 (1.549 m)   Wt 72.6 kg   LMP 12/06/2023 (Exact Date)   SpO2 100%   BMI 30.23 kg/m   Physical Exam Vitals and  nursing note reviewed.  Constitutional:      General: She is not in acute distress.    Appearance: She is well-developed. She is not diaphoretic.  HENT:     Head: Normocephalic and atraumatic.     Right Ear: External ear normal.     Left Ear: External ear normal.     Ears:     Comments: Right TM normal, no pain with movement of the pinna. Left TM is bulging with purulent opacified middle ear infection without perforation of the TM, no involvement of the ear canal, no bulging or tenderness of the mastoid process.    Nose: Nose  normal.     Mouth/Throat:     Mouth: Mucous membranes are moist.  Eyes:     General: No scleral icterus.    Conjunctiva/sclera: Conjunctivae normal.  Cardiovascular:     Rate and Rhythm: Normal rate and regular rhythm.     Heart sounds: Normal heart sounds. No murmur heard.    No friction rub. No gallop.  Pulmonary:     Effort: Pulmonary effort is normal. No respiratory distress.     Breath sounds: Normal breath sounds.  Abdominal:     General: Bowel sounds are normal. There is no distension.     Palpations: Abdomen is soft. There is no mass.     Tenderness: There is no abdominal tenderness. There is no guarding.  Musculoskeletal:     Cervical back: Normal range of motion.  Skin:    General: Skin is warm and dry.  Neurological:     Mental Status: She is alert and oriented to person, place, and time.     Comments: Sustained right lateral nystagmus with eye movement well on  Psychiatric:        Behavior: Behavior normal.     (all labs ordered are listed, but only abnormal results are displayed) Labs Reviewed - No data to display  EKG: None  Radiology: No results found.   Procedures   Medications Ordered in the ED  ketorolac  (TORADOL ) injection 60 mg (60 mg Intramuscular Given 12/18/23 1124)  dexamethasone  (DECADRON ) injection 10 mg (10 mg Intramuscular Given 12/18/23 1126)  diazepam  (VALIUM ) tablet 2 mg (2 mg Oral Given 12/18/23 1123)   meclizine  (ANTIVERT ) tablet 12.5 mg (12.5 mg Oral Given 12/18/23 1123)    Clinical Course as of 12/21/23 0645  Wed Dec 18, 2023  1300 Patient is feeling significantly improved she is able to ambulate on her own after treatment here in the emergency department. [AH]    Clinical Course User Index [AH] Arloa Chroman, PA-C                                 Medical Decision Making Risk Prescription drug management.   Patient here with acute otitis media, no evidence of acute mastoiditis.  She has associated peripheral vertigo with negative hints exam.  Patient treated in the emergency department with Decadron , Valium , Toradol .  Her vertigo is essentially resolved she is able to ambulate without assistance.  Will discharge with symptomatic treatment.  Patient will be switched to cefdinir  she is able to tolerate cephalosporins likely has improved coverage for treatment of her acute superlatives otitis media over azithromycin .     Final diagnoses:  Non-recurrent acute suppurative otitis media of left ear without spontaneous rupture of tympanic membrane  Peripheral vertigo involving left ear    ED Discharge Orders     None          Arloa Chroman, PA-C 12/21/23 1610    Dasie Faden, MD 12/22/23 [phone]

## 2024-01-01 ENCOUNTER — Other Ambulatory Visit (HOSPITAL_BASED_OUTPATIENT_CLINIC_OR_DEPARTMENT_OTHER): Payer: Self-pay

## 2024-01-01 MED ORDER — AMPHETAMINE-DEXTROAMPHETAMINE 10 MG PO TABS
10.0000 mg | ORAL_TABLET | Freq: Every evening | ORAL | 0 refills | Status: AC | PRN
  Filled 2024-01-01: qty 20, 20d supply, fill #0

## 2024-01-01 MED ORDER — AMPHETAMINE-DEXTROAMPHET ER 30 MG PO CP24
30.0000 mg | ORAL_CAPSULE | Freq: Every morning | ORAL | 0 refills | Status: AC
  Filled 2024-01-01: qty 30, 30d supply, fill #0

## 2024-01-14 ENCOUNTER — Ambulatory Visit: Admitting: Internal Medicine

## 2024-01-14 NOTE — Progress Notes (Deleted)
 Office Visit Note  Patient: Angelica Kim             Date of Birth: 01-16-1991           MRN: [id number]             PCP: Patient, No Pcp Per Referring: No ref. provider found Visit Date: 01/14/2024   Subjective:  No chief complaint on file.   History of Present Illness: Angelica Kim is a 33 y.o. 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    She reports being told she had a history of ankylosing spondylitis around 2021, but no definitive diagnosis was made that I can see. Documentation describes concern for AS as well as fibromyalgia since at least 2015 or earlier. This sounds like it was suspected based on results of HLA-B27 testing along with persistent low back pain and other joint symptoms. Lumbar spine imaging has demonstrated some DDD involving L5-S1. Recently, she has experienced worsening joint pain and stiffness, now involving her hands, feet, hips, and jaw. The symptoms have intensified, impacting daily activities such as opening jars and walking.   During her 4 pregnancies most recent of which delivered 8 months ago, she experienced swelling in her hands and feet. Symptoms improved postpartum but subsequently worsened. She describes episodes where it takes up to 30 minutes to get out of bed due to stiffness and pain, particularly in her hands and feet, which swell and become painful. Recently, her hips have been particularly painful, and she notes a lymph node that swells frequently under her jaw.   She manages her symptoms with over-the-counter medications, taking 1000 mg of Tylenol  and [id number] mg of ibuprofen  as needed, especially since last Thursday when her symptoms intensified. Her back is stiff in the morning, and on bad days, it takes longer to get out of bed due to tightness and  stiffness.   No recent rashes or skin changes, but she notes occasional numbness in her fingertips. She has a family history of similar symptoms in her mother, who tested negative for autoimmune diseases, and her grandmother has rheumatoid arthritis.   She reports recent episodes of suspected gastritis-like symptoms, with pain under her left rib that comes and goes, lasting a day or two. This was evaluated at the ED with normal troponins and EKG with no concerning findings. She was prescribed cyclobenzaprine  during pregnancy, but she is not currently on that medication. No recent use of steroids.    No Rheumatology ROS completed.   PMFS History:  Patient Active Problem List   Diagnosis Date Noted   Polyarthralgia 10/22/2023   Foamy urine 10/22/2023   Chronic hypertension affecting pregnancy 03/01/2023   Vacuum-assisted vaginal delivery 03/01/2023   Cholestasis during pregnancy in third trimester 03/01/2023   Encounter for induction of labor 02/25/2023   Cholestasis of pregnancy in third trimester 01/25/2023   Anemia of pregnancy 12/21/2022   History of preeclampsia 10/22/2022   Chronic headache disorder 06/12/2021   Fibromyalgia 11/15/2020   Generalized anxiety disorder 04/07/2014   Migraine 12/21/2013    Past Medical History:  Diagnosis Date   Allergy to amoxicillin 02/07/2020   Anemia    Ankylosing spondylitis (HCC) 2012   Anxiety    Asthma    last used rescue inhaler a while ago  Diverticulitis 2014   Fibromyalgia    Gestational HTN, third trimester 12/07/2021   Gestational hypertension 08/03/2020   History of kidney stones    History of pre-eclampsia    History of premature delivery 02/07/2020   History of severe pre-eclampsia 07/30/2019   Hypertension in pregnancy, preeclampsia, severe, third trimester 12/08/2021   Marginal insertion of umbilical cord affecting management of mother in second trimester 12/27/2022   Noted at anatomy, follow growth.     Migraine     Ovarian cyst    PONV (postoperative nausea and vomiting)    Preterm labor    SVD (spontaneous vaginal delivery) 12/09/2021    Family History  Problem Relation Age of Onset   Arthritis Mother    Deep vein thrombosis Mother    Kidney Stones Maternal Grandmother    Hypertension Maternal Grandmother    Heart disease Maternal Grandfather    Healthy Daughter    Healthy Daughter    Healthy Son    Healthy Son    Past Surgical History:  Procedure Laterality Date   COLONOSCOPY     ESOPHAGOGASTRODUODENOSCOPY ENDOSCOPY     spinal tap     wisdom teeeth extraction      XI ROBOTIC ASSISTED SALPINGECTOMY Bilateral 04/12/2023   Procedure: SALPINGECTOMY, ROBOT-ASSISTED;  Surgeon: Gloriann Chick, MD;  Location: [facility];  Service: Gynecology;  Laterality: Bilateral;   Social History   Social History Narrative   Patient drinks caffeine  moderate    \   Patient has a Master's degree    Caffeine : soda (3 12oz per day)      Angelica Kim- Domestic Partner -- has 3 bonus kids with him (2 who live at home) and then 3 biological kids that live at home with her   Immunization History  Administered Date(s) Administered   Tdap 03/23/2019, 02/27/2023     Objective: Vital Signs: LMP 12/06/2023 (Exact Date)    Physical Exam   Musculoskeletal Exam: ***  CDAI Exam: CDAI Score: -- Patient Global: --; Provider Global: -- Swollen: --; Tender: -- Joint Exam 01/14/2024   No joint exam has been documented for this visit   There is currently no information documented on the homunculus. Go to the Rheumatology activity and complete the homunculus joint exam.  Investigation: No additional findings.  Imaging: No results found.  Recent Labs: Lab Results  Component Value Date   WBC 9.2 10/17/2023   HGB 13.0 10/17/2023   PLT 286 10/17/2023   NA 139 10/17/2023   K 4.2 10/17/2023   CL 105 10/17/2023   CO2 26 10/17/2023   GLUCOSE 85 10/17/2023   BUN 13 10/17/2023   CREATININE 0.73 10/17/2023    BILITOT <0.2 10/17/2023   ALKPHOS 83 10/17/2023   AST 19 10/17/2023   ALT 16 10/17/2023   PROT 6.4 (L) 10/17/2023   ALBUMIN 3.6 10/17/2023   CALCIUM 8.8 (L) 10/17/2023   GFRAA >60 08/01/2019    Speciality Comments: No specialty comments available.  Procedures:  No procedures performed Allergies: Amoxicillin   Assessment / Plan:     Visit Diagnoses: No diagnosis found.  ***  Orders: No orders of the defined types were placed in this encounter.  No orders of the defined types were placed in this encounter.    Follow-Up Instructions: No follow-ups on file.   Angelica LELON Ester, MD  Note - This record has been created using [employer].  Chart creation errors have been sought, but may not always  have been located. Such creation  errors do not reflect on  the standard of medical care.

## 2024-03-05 ENCOUNTER — Other Ambulatory Visit (HOSPITAL_BASED_OUTPATIENT_CLINIC_OR_DEPARTMENT_OTHER): Payer: Self-pay

## 2024-03-05 ENCOUNTER — Other Ambulatory Visit (HOSPITAL_COMMUNITY): Payer: Self-pay

## 2024-03-05 MED ORDER — FLUOXETINE HCL 40 MG PO CAPS
40.0000 mg | ORAL_CAPSULE | Freq: Every morning | ORAL | 1 refills | Status: AC
  Filled 2024-03-05: qty 90, 90d supply, fill #0

## 2024-03-05 MED ORDER — AMPHETAMINE-DEXTROAMPHETAMINE 10 MG PO TABS
10.0000 mg | ORAL_TABLET | Freq: Two times a day (BID) | ORAL | 0 refills | Status: AC
  Filled 2024-03-05: qty 60, 30d supply, fill #0

## 2024-03-05 MED ORDER — FLUOXETINE HCL 20 MG PO CAPS
20.0000 mg | ORAL_CAPSULE | Freq: Every morning | ORAL | 1 refills | Status: AC
  Filled 2024-03-05: qty 90, 90d supply, fill #0

## 2024-03-05 MED ORDER — CLONIDINE HCL 0.1 MG PO TABS
0.1000 mg | ORAL_TABLET | Freq: Every evening | ORAL | 3 refills | Status: AC
  Filled 2024-03-05: qty 30, 30d supply, fill #0

## 2024-03-06 ENCOUNTER — Other Ambulatory Visit (HOSPITAL_BASED_OUTPATIENT_CLINIC_OR_DEPARTMENT_OTHER): Payer: Self-pay
